# Patient Record
Sex: Male | Born: 1955
Health system: Southern US, Community
[De-identification: ages and names within clinical notes are randomized; demographics above are authoritative.]

## PROBLEM LIST (undated history)

## (undated) DIAGNOSIS — I5032 Chronic diastolic (congestive) heart failure: Secondary | ICD-10-CM

## (undated) DIAGNOSIS — R609 Edema, unspecified: Secondary | ICD-10-CM

## (undated) DIAGNOSIS — I4891 Unspecified atrial fibrillation: Secondary | ICD-10-CM

## (undated) DIAGNOSIS — I679 Cerebrovascular disease, unspecified: Secondary | ICD-10-CM

## (undated) DIAGNOSIS — R7303 Prediabetes: Secondary | ICD-10-CM

## (undated) DIAGNOSIS — G629 Polyneuropathy, unspecified: Secondary | ICD-10-CM

## (undated) DIAGNOSIS — I639 Cerebral infarction, unspecified: Secondary | ICD-10-CM

## (undated) DIAGNOSIS — G510 Bell's palsy: Secondary | ICD-10-CM

## (undated) DIAGNOSIS — I10 Essential (primary) hypertension: Secondary | ICD-10-CM

## (undated) DIAGNOSIS — G4733 Obstructive sleep apnea (adult) (pediatric): Secondary | ICD-10-CM

## (undated) DIAGNOSIS — I6523 Occlusion and stenosis of bilateral carotid arteries: Secondary | ICD-10-CM

## (undated) DIAGNOSIS — M1990 Unspecified osteoarthritis, unspecified site: Secondary | ICD-10-CM

## (undated) DIAGNOSIS — E785 Hyperlipidemia, unspecified: Secondary | ICD-10-CM

## (undated) DIAGNOSIS — K449 Diaphragmatic hernia without obstruction or gangrene: Secondary | ICD-10-CM

## (undated) DIAGNOSIS — G459 Transient cerebral ischemic attack, unspecified: Secondary | ICD-10-CM

## (undated) HISTORY — DX: Prediabetes: R73.03

## (undated) HISTORY — DX: Polyneuropathy, unspecified: G62.9

## (undated) HISTORY — PX: WISDOM TOOTH EXTRACTION: SHX21

## (undated) HISTORY — DX: Obstructive sleep apnea (adult) (pediatric): G47.33

## (undated) HISTORY — PX: ESOPHAGUS SURGERY: SHX626

## (undated) HISTORY — DX: Cerebrovascular disease, unspecified: I67.9

## (undated) HISTORY — PX: TONSILLECTOMY: SUR1361

## (undated) HISTORY — DX: Transient cerebral ischemic attack, unspecified: G45.9

## (undated) HISTORY — DX: Occlusion and stenosis of bilateral carotid arteries: I65.23

## (undated) HISTORY — DX: Chronic diastolic (congestive) heart failure: I50.32

## (undated) HISTORY — PX: KNEE SURGERY: SHX244

## (undated) HISTORY — DX: Hyperlipidemia, unspecified: E78.5

## (undated) HISTORY — DX: Edema, unspecified: R60.9

---

## 1997-10-22 ENCOUNTER — Emergency Department (HOSPITAL_COMMUNITY): Admission: EM | Admit: 1997-10-22 | Discharge: 1997-10-22 | Payer: Self-pay | Admitting: Emergency Medicine

## 1998-03-18 ENCOUNTER — Encounter: Payer: Self-pay | Admitting: Internal Medicine

## 1998-03-18 ENCOUNTER — Emergency Department (HOSPITAL_COMMUNITY): Admission: EM | Admit: 1998-03-18 | Discharge: 1998-03-18 | Payer: Self-pay | Admitting: Internal Medicine

## 1998-06-12 ENCOUNTER — Encounter: Payer: Self-pay | Admitting: Surgery

## 1998-06-12 ENCOUNTER — Ambulatory Visit (HOSPITAL_COMMUNITY): Admission: RE | Admit: 1998-06-12 | Discharge: 1998-06-12 | Payer: Self-pay | Admitting: Surgery

## 1998-06-25 ENCOUNTER — Encounter: Payer: Self-pay | Admitting: Surgery

## 1998-06-25 ENCOUNTER — Observation Stay (HOSPITAL_COMMUNITY): Admission: RE | Admit: 1998-06-25 | Discharge: 1998-06-27 | Payer: Self-pay | Admitting: Surgery

## 1999-05-29 ENCOUNTER — Emergency Department (HOSPITAL_COMMUNITY): Admission: EM | Admit: 1999-05-29 | Discharge: 1999-05-29 | Payer: Self-pay | Admitting: *Deleted

## 1999-07-08 ENCOUNTER — Ambulatory Visit (HOSPITAL_COMMUNITY): Admission: RE | Admit: 1999-07-08 | Discharge: 1999-07-08 | Payer: Self-pay | Admitting: Occupational Medicine

## 1999-07-08 ENCOUNTER — Encounter: Payer: Self-pay | Admitting: Occupational Medicine

## 2000-09-07 ENCOUNTER — Emergency Department (HOSPITAL_COMMUNITY): Admission: EM | Admit: 2000-09-07 | Discharge: 2000-09-07 | Payer: Self-pay | Admitting: Emergency Medicine

## 2000-09-07 ENCOUNTER — Encounter: Payer: Self-pay | Admitting: Emergency Medicine

## 2000-10-20 ENCOUNTER — Emergency Department (HOSPITAL_COMMUNITY): Admission: EM | Admit: 2000-10-20 | Discharge: 2000-10-20 | Payer: Self-pay | Admitting: Emergency Medicine

## 2000-10-28 ENCOUNTER — Emergency Department (HOSPITAL_COMMUNITY): Admission: EM | Admit: 2000-10-28 | Discharge: 2000-10-28 | Payer: Self-pay | Admitting: Emergency Medicine

## 2000-10-30 ENCOUNTER — Emergency Department (HOSPITAL_COMMUNITY): Admission: EM | Admit: 2000-10-30 | Discharge: 2000-10-30 | Payer: Self-pay | Admitting: Emergency Medicine

## 2003-03-06 ENCOUNTER — Emergency Department (HOSPITAL_COMMUNITY): Admission: EM | Admit: 2003-03-06 | Discharge: 2003-03-06 | Payer: Self-pay | Admitting: *Deleted

## 2003-06-12 ENCOUNTER — Emergency Department (HOSPITAL_COMMUNITY): Admission: EM | Admit: 2003-06-12 | Discharge: 2003-06-12 | Payer: Self-pay | Admitting: Emergency Medicine

## 2003-09-09 ENCOUNTER — Emergency Department (HOSPITAL_COMMUNITY): Admission: EM | Admit: 2003-09-09 | Discharge: 2003-09-09 | Payer: Self-pay | Admitting: Emergency Medicine

## 2005-10-19 ENCOUNTER — Emergency Department (HOSPITAL_COMMUNITY): Admission: EM | Admit: 2005-10-19 | Discharge: 2005-10-19 | Payer: Self-pay | Admitting: Emergency Medicine

## 2006-09-19 ENCOUNTER — Emergency Department (HOSPITAL_COMMUNITY): Admission: EM | Admit: 2006-09-19 | Discharge: 2006-09-19 | Payer: Self-pay | Admitting: Emergency Medicine

## 2007-12-02 ENCOUNTER — Emergency Department (HOSPITAL_BASED_OUTPATIENT_CLINIC_OR_DEPARTMENT_OTHER): Admission: EM | Admit: 2007-12-02 | Discharge: 2007-12-02 | Payer: Self-pay | Admitting: Emergency Medicine

## 2008-05-25 ENCOUNTER — Emergency Department (HOSPITAL_BASED_OUTPATIENT_CLINIC_OR_DEPARTMENT_OTHER): Admission: EM | Admit: 2008-05-25 | Discharge: 2008-05-25 | Payer: Self-pay | Admitting: Emergency Medicine

## 2008-05-25 ENCOUNTER — Ambulatory Visit: Payer: Self-pay | Admitting: Diagnostic Radiology

## 2008-07-23 ENCOUNTER — Emergency Department (HOSPITAL_BASED_OUTPATIENT_CLINIC_OR_DEPARTMENT_OTHER): Admission: EM | Admit: 2008-07-23 | Discharge: 2008-07-23 | Payer: Self-pay | Admitting: Emergency Medicine

## 2008-08-21 ENCOUNTER — Ambulatory Visit: Payer: Self-pay | Admitting: Diagnostic Radiology

## 2008-08-21 ENCOUNTER — Emergency Department (HOSPITAL_BASED_OUTPATIENT_CLINIC_OR_DEPARTMENT_OTHER): Admission: EM | Admit: 2008-08-21 | Discharge: 2008-08-21 | Payer: Self-pay | Admitting: Emergency Medicine

## 2009-08-20 ENCOUNTER — Emergency Department (HOSPITAL_BASED_OUTPATIENT_CLINIC_OR_DEPARTMENT_OTHER): Admission: EM | Admit: 2009-08-20 | Discharge: 2009-08-20 | Payer: Self-pay | Admitting: Emergency Medicine

## 2010-01-17 ENCOUNTER — Emergency Department (HOSPITAL_BASED_OUTPATIENT_CLINIC_OR_DEPARTMENT_OTHER): Admission: EM | Admit: 2010-01-17 | Discharge: 2010-01-18 | Payer: Self-pay | Admitting: Emergency Medicine

## 2010-03-29 ENCOUNTER — Emergency Department (HOSPITAL_BASED_OUTPATIENT_CLINIC_OR_DEPARTMENT_OTHER)
Admission: EM | Admit: 2010-03-29 | Discharge: 2010-03-30 | Payer: Self-pay | Source: Home / Self Care | Admitting: Emergency Medicine

## 2010-05-18 ENCOUNTER — Emergency Department (HOSPITAL_BASED_OUTPATIENT_CLINIC_OR_DEPARTMENT_OTHER)
Admission: EM | Admit: 2010-05-18 | Discharge: 2010-05-18 | Disposition: A | Discharge status: Home / Self Care | Payer: Self-pay | Attending: Emergency Medicine | Admitting: Emergency Medicine

## 2010-05-18 ENCOUNTER — Emergency Department (INDEPENDENT_AMBULATORY_CARE_PROVIDER_SITE_OTHER): Payer: Self-pay

## 2010-05-18 DIAGNOSIS — K219 Gastro-esophageal reflux disease without esophagitis: Secondary | ICD-10-CM | POA: Insufficient documentation

## 2010-05-18 DIAGNOSIS — R413 Other amnesia: Secondary | ICD-10-CM

## 2010-05-18 DIAGNOSIS — R220 Localized swelling, mass and lump, head: Secondary | ICD-10-CM

## 2010-05-18 DIAGNOSIS — R221 Localized swelling, mass and lump, neck: Secondary | ICD-10-CM | POA: Insufficient documentation

## 2010-05-18 DIAGNOSIS — I10 Essential (primary) hypertension: Secondary | ICD-10-CM | POA: Insufficient documentation

## 2010-05-18 LAB — URINALYSIS, ROUTINE W REFLEX MICROSCOPIC
Bilirubin Urine: NEGATIVE
Hgb urine dipstick: NEGATIVE
Nitrite: NEGATIVE
Protein, ur: NEGATIVE mg/dL
Specific Gravity, Urine: 1.023 (ref 1.005–1.030)
Urobilinogen, UA: 1 mg/dL (ref 0.0–1.0)

## 2010-05-18 LAB — DIFFERENTIAL
Basophils Absolute: 0.2 10*3/uL — ABNORMAL HIGH (ref 0.0–0.1)
Basophils Relative: 2 % — ABNORMAL HIGH (ref 0–1)
Eosinophils Absolute: 0.4 10*3/uL (ref 0.0–0.7)
Monocytes Absolute: 0.5 10*3/uL (ref 0.1–1.0)
Monocytes Relative: 7 % (ref 3–12)
Neutro Abs: 3.4 10*3/uL (ref 1.7–7.7)

## 2010-05-18 LAB — BASIC METABOLIC PANEL
Calcium: 9 mg/dL (ref 8.4–10.5)
Creatinine, Ser: 1.1 mg/dL (ref 0.4–1.5)
GFR calc Af Amer: >60 mL/min (ref >60)
GFR calc non Af Amer: >60 mL/min (ref >60)
Sodium: 142 mEq/L (ref 135–145)

## 2010-05-18 LAB — CBC
Hemoglobin: 15.4 g/dL (ref 13.0–17.0)
MCH: 29.6 pg (ref 26.0–34.0)
MCHC: 35.6 g/dL (ref 30.0–36.0)
Platelets: 350 10*3/uL (ref 150–400)
RDW: 12.7 % (ref 11.5–15.5)

## 2010-05-27 LAB — CBC
MCH: 30.7 pg (ref 26.0–34.0)
Platelets: 291 10*3/uL (ref 150–400)
RBC: 4.77 MIL/uL (ref 4.22–5.81)

## 2010-05-27 LAB — DIFFERENTIAL
Basophils Relative: 1 % (ref 0–1)
Eosinophils Absolute: 0.3 10*3/uL (ref 0.0–0.7)
Lymphs Abs: 2.9 10*3/uL (ref 0.7–4.0)
Neutro Abs: 3.1 10*3/uL (ref 1.7–7.7)
Neutrophils Relative %: 47 % (ref 43–77)

## 2010-05-27 LAB — URINALYSIS, ROUTINE W REFLEX MICROSCOPIC
Hgb urine dipstick: NEGATIVE
Nitrite: NEGATIVE
Protein, ur: NEGATIVE mg/dL
Specific Gravity, Urine: 1.029 (ref 1.005–1.030)
Urobilinogen, UA: 1 mg/dL (ref 0.0–1.0)

## 2010-05-27 LAB — BASIC METABOLIC PANEL
CO2: 26 mEq/L (ref 19–32)
Calcium: 9.3 mg/dL (ref 8.4–10.5)
Creatinine, Ser: 1.2 mg/dL (ref 0.4–1.5)
GFR calc Af Amer: >60 mL/min (ref >60)
GFR calc non Af Amer: >60 mL/min (ref >60)
Sodium: 144 mEq/L (ref 135–145)

## 2011-05-14 DIAGNOSIS — M25571 Pain in right ankle and joints of right foot: Secondary | ICD-10-CM | POA: Insufficient documentation

## 2011-05-14 DIAGNOSIS — M958 Other specified acquired deformities of musculoskeletal system: Secondary | ICD-10-CM | POA: Insufficient documentation

## 2012-06-29 ENCOUNTER — Emergency Department (HOSPITAL_COMMUNITY)
Admission: EM | Admit: 2012-06-29 | Discharge: 2012-06-29 | Disposition: A | Discharge status: Home / Self Care | Payer: Self-pay | Attending: Emergency Medicine | Admitting: Emergency Medicine

## 2012-06-29 ENCOUNTER — Emergency Department (HOSPITAL_COMMUNITY): Payer: Self-pay

## 2012-06-29 ENCOUNTER — Encounter (HOSPITAL_COMMUNITY): Payer: Self-pay | Admitting: Emergency Medicine

## 2012-06-29 DIAGNOSIS — R51 Headache: Secondary | ICD-10-CM | POA: Insufficient documentation

## 2012-06-29 DIAGNOSIS — R509 Fever, unspecified: Secondary | ICD-10-CM | POA: Insufficient documentation

## 2012-06-29 DIAGNOSIS — W57XXXA Bitten or stung by nonvenomous insect and other nonvenomous arthropods, initial encounter: Secondary | ICD-10-CM | POA: Insufficient documentation

## 2012-06-29 DIAGNOSIS — R5381 Other malaise: Secondary | ICD-10-CM | POA: Insufficient documentation

## 2012-06-29 DIAGNOSIS — M7989 Other specified soft tissue disorders: Secondary | ICD-10-CM | POA: Insufficient documentation

## 2012-06-29 DIAGNOSIS — R209 Unspecified disturbances of skin sensation: Secondary | ICD-10-CM | POA: Insufficient documentation

## 2012-06-29 DIAGNOSIS — Y929 Unspecified place or not applicable: Secondary | ICD-10-CM | POA: Insufficient documentation

## 2012-06-29 DIAGNOSIS — R5383 Other fatigue: Secondary | ICD-10-CM | POA: Insufficient documentation

## 2012-06-29 DIAGNOSIS — S30860A Insect bite (nonvenomous) of lower back and pelvis, initial encounter: Secondary | ICD-10-CM | POA: Insufficient documentation

## 2012-06-29 DIAGNOSIS — R609 Edema, unspecified: Secondary | ICD-10-CM | POA: Insufficient documentation

## 2012-06-29 DIAGNOSIS — M549 Dorsalgia, unspecified: Secondary | ICD-10-CM | POA: Insufficient documentation

## 2012-06-29 DIAGNOSIS — Z8673 Personal history of transient ischemic attack (TIA), and cerebral infarction without residual deficits: Secondary | ICD-10-CM | POA: Insufficient documentation

## 2012-06-29 DIAGNOSIS — H5789 Other specified disorders of eye and adnexa: Secondary | ICD-10-CM | POA: Insufficient documentation

## 2012-06-29 DIAGNOSIS — Y9389 Activity, other specified: Secondary | ICD-10-CM | POA: Insufficient documentation

## 2012-06-29 DIAGNOSIS — Z7982 Long term (current) use of aspirin: Secondary | ICD-10-CM | POA: Insufficient documentation

## 2012-06-29 DIAGNOSIS — R0789 Other chest pain: Secondary | ICD-10-CM | POA: Insufficient documentation

## 2012-06-29 DIAGNOSIS — R0602 Shortness of breath: Secondary | ICD-10-CM | POA: Insufficient documentation

## 2012-06-29 LAB — BASIC METABOLIC PANEL
BUN: 13 mg/dL (ref 6–23)
Calcium: 8.8 mg/dL (ref 8.4–10.5)
Chloride: 100 mEq/L (ref 96–112)
Creatinine, Ser: 1.08 mg/dL (ref 0.50–1.35)
GFR calc Af Amer: 86 mL/min — ABNORMAL LOW (ref >90)
GFR calc non Af Amer: 74 mL/min — ABNORMAL LOW (ref >90)
Glucose, Bld: 109 mg/dL — ABNORMAL HIGH (ref 70–99)
Sodium: 135 mEq/L (ref 135–145)

## 2012-06-29 LAB — CBC
Hemoglobin: 14.5 g/dL (ref 13.0–17.0)
Platelets: 333 10*3/uL (ref 150–400)
RBC: 5.09 MIL/uL (ref 4.22–5.81)
WBC: 5.3 10*3/uL (ref 4.0–10.5)

## 2012-06-29 MED ORDER — KETOROLAC TROMETHAMINE 30 MG/ML IJ SOLN
30.0000 mg | Freq: Once | INTRAMUSCULAR | Status: AC
  Administered 2012-06-29: 30 mg via INTRAVENOUS
  Filled 2012-06-29: qty 1

## 2012-06-29 MED ORDER — ONDANSETRON HCL 4 MG/2ML IJ SOLN
4.0000 mg | Freq: Once | INTRAMUSCULAR | Status: AC
  Administered 2012-06-29: 4 mg via INTRAVENOUS
  Filled 2012-06-29: qty 2

## 2012-06-29 MED ORDER — DOXYCYCLINE HYCLATE 100 MG PO TABS
100.0000 mg | ORAL_TABLET | Freq: Once | ORAL | Status: AC
  Administered 2012-06-29: 100 mg via ORAL
  Filled 2012-06-29: qty 1

## 2012-06-29 MED ORDER — IBUPROFEN 800 MG PO TABS: 800.0000 mg | ORAL_TABLET | Freq: Three times a day (TID) | ORAL | Status: DC | PRN

## 2012-06-29 MED ORDER — CYCLOBENZAPRINE HCL 5 MG PO TABS: 5.0000 mg | ORAL_TABLET | Freq: Three times a day (TID) | ORAL | Status: DC | PRN

## 2012-06-29 MED ORDER — DOXYCYCLINE HYCLATE 100 MG PO TABS: 100.0000 mg | ORAL_TABLET | Freq: Two times a day (BID) | ORAL | Status: DC

## 2012-06-29 NOTE — ED Provider Notes (Signed)
I saw and evaluated the patient, reviewed the resident's note and I agree with the findings and plan.   .Face to face Exam:  General:  Awake HEENT:  Atraumatic Resp:  Normal effort Abd:  Nondistended Neuro:No focal weakness    Nelia Shi, MD 06/29/12 2245

## 2012-06-29 NOTE — ED Provider Notes (Signed)
History     CSN: [id number]  Arrival date & time 06/29/12  1502   First MD Initiated Contact with Patient 06/29/12 1511      Chief Complaint  Patient presents with  . Chest Pain  . Back Pain    (Consider location/radiation/quality/duration/timing/severity/associated sxs/prior treatment) HPI 57 yo M with h/o of "mini-stroke" who presents with multiple concerns.    1. Chest pain: located in upper left chest, described as sharp, lasts <1 minute and resolves on it's own.  Occurs intermittently, not every day.  Not associated with activity.  Does get a little short of breath with it, but no diaphoresis or dizziness. 2. Headaches: Been going on for about 5 months.  Located through out head, described as "real bad."  Typically last 30-60 minutes.  No associated nausea or photophobia, but does states it gets better in a dark, quiet room.  His wife reports that the headaches seem to be worse at night, but he denies any triggers for the headaches.  No associated neurologic deficits.  Does get a blood shot right eye that may be associated with the headaches.  Does not take any medication for them, they resolve on their own. 3. Back pain: Started last night, located in mid and low back, no radiation, gradually increase in pain/discomfort.  More comfortable when lying on his side.  Denies any recent trauma or heavy lifting.  If anything, activity has been decreased the last few days. 4. Neck swelling: Feels like his neck is swollen under his chin.  Also states he feels like he has been having fevers.  No night sweats or weight loss. 5. Shortness of breath: Chronic.  May be a little worse with lying flat.  No cough.  History reviewed. No pertinent past medical history.  History reviewed. No pertinent past surgical history.  No family history on file.  History  Substance Use Topics  . Smoking status: Never Smoker   . Smokeless tobacco: Never Used  . Alcohol Use: No      Review of Systems   Constitutional: Positive for fever and activity change (decreased). Negative for chills.  HENT: Negative for congestion, sore throat, rhinorrhea, neck pain and neck stiffness.        Neck swelling  Eyes: Positive for redness (intermittent blood shot right eye).  Respiratory: Positive for shortness of breath. Negative for cough.   Cardiovascular: Positive for chest pain and leg swelling. Negative for palpitations.  Gastrointestinal: Negative for nausea, vomiting, diarrhea, constipation and abdominal distention.  Genitourinary: Negative.   Musculoskeletal: Positive for back pain.  Skin: Negative.   Neurological: Positive for weakness (generalized), numbness (intermittent in left foot) and headaches. Negative for syncope and light-headedness.    Allergies  Penicillins  Home Medications   Current Outpatient Rx  Name  Route  Sig  Dispense  Refill  . aspirin 325 MG tablet   Oral   Take 325 mg by mouth once.           BP 136/91  Pulse 70  Temp(Src) 98.7 F (37.1 C) (Oral)  Resp 21  SpO2 98%  Physical Exam  Constitutional: He is oriented to person, place, and time. He appears well-developed and well-nourished. No distress.  Morbidly obese  HENT:  Head: Normocephalic and atraumatic.  Right Ear: External ear normal.  Left Ear: External ear normal.  Mouth/Throat: Oropharynx is clear and moist. No oropharyngeal exudate.  Eyes: Conjunctivae and EOM are normal. Pupils are equal, round, and reactive to light. Right  eye exhibits no discharge. Left eye exhibits no discharge. No scleral icterus.  Neck: Normal range of motion. Neck supple. No JVD present. No tracheal deviation present.  Bilateral sub-mandibular LAD  Cardiovascular: Normal rate, regular rhythm, normal heart sounds and intact distal pulses.   No murmur heard. Pulmonary/Chest: Effort normal and breath sounds normal. No respiratory distress. He has no wheezes. He has no rales. He exhibits tenderness (left anterior superior  chest wall).  Abdominal: Soft. Bowel sounds are normal. He exhibits no distension and no mass. There is no tenderness. There is no rebound and no guarding.  Limited exam due to body habitus  Musculoskeletal: He exhibits edema (2+ pitting edema, slightly worse on the left). He exhibits no tenderness.  Back: No erythema, edema, or obvious deformity.  Tender over lumbar spine and paraspinous muscles.  Some spasm in lumbar paraspinous muscles.  No point tenderness.  Neurological: He is alert and oriented to person, place, and time. He displays normal reflexes. No cranial nerve deficit. He exhibits normal muscle tone. Coordination normal.  Skin: Skin is warm and dry. No rash noted. He is not diaphoretic. No erythema.  Psychiatric: He has a normal mood and affect. Thought content normal.    ED Course  Procedures (including critical care time)  Labs Reviewed  CBC  BASIC METABOLIC PANEL  PRO B NATRIURETIC PEPTIDE  POCT I-STAT TROPONIN I   Dg Chest Port 1 View  06/29/2012  *RADIOLOGY REPORT*  Clinical Data: Chest pain.  Back pain.  Short of breath.  PORTABLE CHEST - 1 VIEW  Comparison: 02/28/2012.  Findings:  Cardiopericardial silhouette within normal limits. Mediastinal contours normal. Trachea midline.  No airspace disease or effusion. Monitoring leads are projected over the chest. No pneumothorax.  IMPRESSION: No active cardiopulmonary disease or interval change.   Original Report Authenticated By: Andreas Newport, M.D.      No diagnosis found.    MDM  57 yo M with ? History of TIA presenting with multiple concerns.  Chest pain is atypical on history and reproducible on exam, EKG negative for ischemia, troponin negative.  Suspect shortness of breath is related to obesity, CXR negative for pneumonia or pulmonary edema.  PE is in the differential but Wells score is 0. He does have LE swelling but this is more likely related to obesity as he reports it is bilateral and improves with elevation.   Headaches are likely benign given lack of neurologic findings and normal head CT 2 years ago.  Did report tick bite 1-2 weeks ago and subjective fevers - will treat presumptively for RMSF.  Labs are unremarkable.  Discussed importance of weight loss for his overall health.  Also encouraged him to find a PCP that can follow his concerns over time.  Provided number for IMTS clinic.  Will d/c with flexeril, ibuprofen and 10 day course of doxycycline.  Reviewed reasons to return.  Patient agreeable with plan.         Phebe Colla, MD 06/29/12 [phone]

## 2012-06-29 NOTE — Progress Notes (Signed)
During [facility] ED 06/29/12 visit CM spoke with pt who confirms self pay [place] resident with no pcp. CM discussed and provided written information for self pay pcps, importance of pcp for f/u care, www.needymeds.org, discounted pharmacies and other [place] and [place] resources such as financial assistance, DSS and  health department Reviewed Health connect number to assist with finding self pay provider close to pt's residence. Reviewed resources for  and [place] self pay pcps like [facility], family medicine at [address], [facility] family practice, general medical clinics, [facility] urgent care plus others, [facility] out patient pharmacies and housing Pt voiced understanding and appreciation of resources provided

## 2012-06-29 NOTE — ED Notes (Signed)
Pt c/o left sided chest pain that started late last night and back pain.  Pt states that he feels like his neck is swollen.  Pt states that he has been Shob, nauseated and lightheaded with chest pain.  Pt has been having headaches for little while now, and pt's family believes it might be BP related.  Pt doesn't have a PCP.  Pt was in [place] hospital think 6 months ago with "mini strokes".

## 2012-08-04 ENCOUNTER — Encounter (HOSPITAL_COMMUNITY): Payer: Self-pay | Admitting: *Deleted

## 2012-08-04 ENCOUNTER — Emergency Department (HOSPITAL_COMMUNITY)
Admission: EM | Admit: 2012-08-04 | Discharge: 2012-08-04 | Disposition: A | Discharge status: Home / Self Care | Payer: Self-pay | Attending: Emergency Medicine | Admitting: Emergency Medicine

## 2012-08-04 ENCOUNTER — Emergency Department (HOSPITAL_COMMUNITY): Payer: Self-pay

## 2012-08-04 DIAGNOSIS — M25469 Effusion, unspecified knee: Secondary | ICD-10-CM | POA: Insufficient documentation

## 2012-08-04 DIAGNOSIS — M25461 Effusion, right knee: Secondary | ICD-10-CM

## 2012-08-04 MED ORDER — HYDROCODONE-ACETAMINOPHEN 5-325 MG PO TABS: 2.0000 | ORAL_TABLET | Freq: Four times a day (QID) | ORAL | Status: DC | PRN

## 2012-08-04 NOTE — ED Provider Notes (Signed)
History  This chart was scribed for non-physician practitioner Roxy Horseman, PA-C working with Ward Givens, MD, by Candelaria Stagers, ED Scribe. This patient was seen in room TR08C/TR08C and the patient's care was started at 8:59 PM   CSN: [id number]  Arrival date & time 08/04/12  1949   First MD Initiated Contact with Patient 08/04/12 2003      Chief Complaint  Patient presents with  . Knee Pain     The history is provided by the patient. No language interpreter was used.   HPI Comments: Edward Weaver is a 57 y.o. male who presents to the Emergency Department complaining of constant right knee pain that became worse several days ago.  Pt reports that today the knee gave out while walking.  He has h/o surgery to the right knee with screw in place.  Pt denies any recent injury or trauma.  Pt is ambulatory with pain.  He has taken nothing for the pain.    No past medical history on file.  Past Surgical History  Procedure Laterality Date  . Knee surgery Right     1974    No family history on file.  History  Substance Use Topics  . Smoking status: Never Smoker   . Smokeless tobacco: Never Used  . Alcohol Use: No      Review of Systems  Musculoskeletal: Positive for arthralgias (right knee pain).  All other systems reviewed and are negative.    Allergies  Penicillins  Home Medications   Current Outpatient Rx  Name  Route  Sig  Dispense  Refill  . ibuprofen (ADVIL,MOTRIN) 800 MG tablet   Oral   Take 1 tablet (800 mg total) by mouth every 8 (eight) hours as needed for pain.   30 tablet   0     BP 138/84  Pulse 68  Temp(Src) 97.9 F (36.6 C)  Resp 18  SpO2 97%  Physical Exam  Nursing note and vitals reviewed. Constitutional: He is oriented to person, place, and time. He appears well-developed and well-nourished. No distress.  HENT:  Head: Normocephalic and atraumatic.  Eyes: EOM are normal.  Neck: Neck supple. No tracheal deviation present.   Cardiovascular: Normal rate.   Pulmonary/Chest: Effort normal. No respiratory distress.  Musculoskeletal: Normal range of motion. He exhibits edema and tenderness.  Right knee moderately swollen. No bony deformity or abnormality.  Mild joint line tenderness.  ROM 4/5.  Strength deferred.  Joint stability testing deferred secondary to swelling.    Neurological: He is alert and oriented to person, place, and time.  Skin: Skin is warm and dry.  Psychiatric: He has a normal mood and affect. His behavior is normal.    ED Course  Procedures   DIAGNOSTIC STUDIES: Oxygen Saturation is 97% on room air, normal by my interpretation.    COORDINATION OF CARE:  9:01 PM Discussed course of care with pt which includes follow up with orthopaedist.  Will order knee sleeve and pain medication. Pt denies crutches Discussed images with pt.  Pt understands and agrees.    Labs Reviewed - No data to display Dg Knee Complete 4 Views Right  08/04/2012   *RADIOLOGY REPORT*  Clinical Data: Right knee pain and swelling.  RIGHT KNEE - COMPLETE 4+ VIEW  Comparison: Right tibia and fibula on 07/29/2012  Findings: No evidence of acute fracture or dislocation. A small knee joint effusion noted.  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  A fixation  screw is seen in the anterior tibial tubercle.  There is no evidence of hardware failure or loosening.  IMPRESSION:  1.  Mild osteoarthritis. 2.  Small knee joint effusion.   Original Report Authenticated By: Myles Rosenthal, M.D.     1. Knee effusion, right       MDM  Patient with right knee effusion. Suspect meniscal or ligament damage. Was unable to get accurate joint stability tests due to guarding. Recommend outpatient orthopedic followup. The patient a knee sleeve. Patient is stable and ready for discharge.  I personally performed the services described in this documentation, which was scribed in my presence. The recorded  information has been reviewed and is accurate.          Roxy Horseman, PA-C 08/04/12 2328  Roxy Horseman, PA-C 08/09/12 [phone]

## 2012-08-04 NOTE — ED Notes (Addendum)
PT reports that he had (R) knee pain that started today.  Denies injury.  States that he had surgery on it in 1974.  Pt ambulatory

## 2012-08-10 NOTE — ED Provider Notes (Signed)
Medical screening examination/treatment/procedure(s) were performed by non-physician practitioner and as supervising physician I was immediately available for consultation/collaboration. Brailynn Breth, MD, FACEP   Tanisa Lagace L Kase Shughart, MD 08/10/12 0001 

## 2012-09-23 ENCOUNTER — Encounter (HOSPITAL_BASED_OUTPATIENT_CLINIC_OR_DEPARTMENT_OTHER): Payer: Self-pay

## 2012-09-23 ENCOUNTER — Emergency Department (HOSPITAL_BASED_OUTPATIENT_CLINIC_OR_DEPARTMENT_OTHER)
Admission: EM | Admit: 2012-09-23 | Discharge: 2012-09-23 | Disposition: A | Discharge status: Home / Self Care | Payer: Self-pay | Attending: Emergency Medicine | Admitting: Emergency Medicine

## 2012-09-23 DIAGNOSIS — R21 Rash and other nonspecific skin eruption: Secondary | ICD-10-CM | POA: Insufficient documentation

## 2012-09-23 DIAGNOSIS — Z880 Allergy status to penicillin: Secondary | ICD-10-CM | POA: Insufficient documentation

## 2012-09-23 DIAGNOSIS — R209 Unspecified disturbances of skin sensation: Secondary | ICD-10-CM | POA: Insufficient documentation

## 2012-09-23 MED ORDER — CEPHALEXIN 500 MG PO CAPS: 500.0000 mg | ORAL_CAPSULE | Freq: Four times a day (QID) | ORAL | Status: DC

## 2012-09-23 MED ORDER — MUPIROCIN CALCIUM 2 % EX CREA: TOPICAL_CREAM | Freq: Three times a day (TID) | CUTANEOUS | Status: DC

## 2012-09-23 NOTE — ED Notes (Signed)
Rash in bilateral axilla x 1 week.

## 2012-09-23 NOTE — ED Provider Notes (Signed)
   History    CSN: [id number] Arrival date & time 09/23/12  1155  First MD Initiated Contact with Patient 09/23/12 1241     Chief Complaint  Patient presents with  . Rash   (Consider location/radiation/quality/duration/timing/severity/associated sxs/prior Treatment) HPI Comments: Patient is a 57 year old male who presents today with one week of worsening rash in his armpits bilaterally. The rash initially began solely in his underarm as little red dots and has spread down his flank. The red bumps are tender to palpation and he gives the quality of "sore". The bumps have whiteheads. He has tried antifungal cream with no relief. He recently started using [place] soap. He denies fever, chills, nausea, vomiting, abdominal pain, shortness of breath, change in detergent. He reports he is nondiabetic.   The history is provided by the patient. No language interpreter was used.   History reviewed. No pertinent past medical history. Past Surgical History  Procedure Laterality Date  . Knee surgery Right     1974   No family history on file. History  Substance Use Topics  . Smoking status: Never Smoker   . Smokeless tobacco: Never Used  . Alcohol Use: No    Review of Systems  Constitutional: Negative for fever and chills.  Respiratory: Negative for shortness of breath.   Cardiovascular: Negative for chest pain.  Gastrointestinal: Negative for nausea, vomiting and abdominal pain.  Skin: Positive for rash.  All other systems reviewed and are negative.    Allergies  Penicillins  Home Medications  No current outpatient prescriptions on file. BP 130/88  Pulse 69  Temp(Src) 97.9 F (36.6 C) (Oral)  Resp 16  Ht 5\' 6"  (1.676 m)  Wt 270 lb (122.471 kg)  BMI 43.6 kg/m2  SpO2 97% Physical Exam  Nursing note and vitals reviewed. Constitutional: He is oriented to person, place, and time. He appears well-developed and well-nourished. No distress.  HENT:  Head: Normocephalic and  atraumatic.  Right Ear: External ear normal.  Left Ear: External ear normal.  Nose: Nose normal.  Eyes: Conjunctivae are normal.  Neck: Normal range of motion. No tracheal deviation present.  Cardiovascular: Normal rate, regular rhythm and normal heart sounds.   Pulmonary/Chest: Effort normal and breath sounds normal. No stridor.  Abdominal: Soft. He exhibits no distension. There is no tenderness.  Musculoskeletal: Normal range of motion.  Neurological: He is alert and oriented to person, place, and time.  Skin: Skin is warm and dry. He is not diaphoretic.  Multiple small areas of erythema with central purulent head beginning and underarms bilaterally and spreading down to his leg  Psychiatric: He has a normal mood and affect. His behavior is normal.    ED Course  Procedures (including critical care time) Labs Reviewed  GLUCOSE, CAPILLARY - Abnormal; Notable for the following:    Glucose-Capillary 106 (*)    All other components within normal limits   No results found. 1. Rash     MDM  Patient presents with bacterial rash in armpits. It is spreading. Given both PO and topical abx. CBG done as I was suspicious for DM. Follow up with your PCP. Discussed case with Dr. Bernette Mayers who agrees with plan. Return instructions given. Vital signs stable for discharge. Patient / Family / Caregiver informed of clinical course, understand medical decision-making process, and agree with plan.   Mora Bellman, PA-C 09/23/12 1347

## 2012-09-25 NOTE — ED Provider Notes (Signed)
Medical screening examination/treatment/procedure(s) were performed by non-physician practitioner and as supervising physician I was immediately available for consultation/collaboration.   Scarlet Abad B. Marionette Meskill, MD 09/25/12 1946 

## 2013-04-30 ENCOUNTER — Emergency Department (HOSPITAL_BASED_OUTPATIENT_CLINIC_OR_DEPARTMENT_OTHER)
Admission: EM | Admit: 2013-04-30 | Discharge: 2013-04-30 | Disposition: A | Discharge status: Home / Self Care | Payer: No Typology Code available for payment source | Attending: Emergency Medicine | Admitting: Emergency Medicine

## 2013-04-30 ENCOUNTER — Encounter (HOSPITAL_BASED_OUTPATIENT_CLINIC_OR_DEPARTMENT_OTHER): Payer: Self-pay | Admitting: Emergency Medicine

## 2013-04-30 ENCOUNTER — Emergency Department (HOSPITAL_BASED_OUTPATIENT_CLINIC_OR_DEPARTMENT_OTHER): Payer: No Typology Code available for payment source

## 2013-04-30 DIAGNOSIS — Y9389 Activity, other specified: Secondary | ICD-10-CM | POA: Insufficient documentation

## 2013-04-30 DIAGNOSIS — Z8719 Personal history of other diseases of the digestive system: Secondary | ICD-10-CM | POA: Insufficient documentation

## 2013-04-30 DIAGNOSIS — S92424A Nondisplaced fracture of distal phalanx of right great toe, initial encounter for closed fracture: Secondary | ICD-10-CM

## 2013-04-30 DIAGNOSIS — E669 Obesity, unspecified: Secondary | ICD-10-CM | POA: Insufficient documentation

## 2013-04-30 DIAGNOSIS — S92919A Unspecified fracture of unspecified toe(s), initial encounter for closed fracture: Secondary | ICD-10-CM | POA: Insufficient documentation

## 2013-04-30 DIAGNOSIS — S92501A Displaced unspecified fracture of right lesser toe(s), initial encounter for closed fracture: Secondary | ICD-10-CM

## 2013-04-30 DIAGNOSIS — Z792 Long term (current) use of antibiotics: Secondary | ICD-10-CM | POA: Insufficient documentation

## 2013-04-30 DIAGNOSIS — Y929 Unspecified place or not applicable: Secondary | ICD-10-CM | POA: Insufficient documentation

## 2013-04-30 DIAGNOSIS — Z880 Allergy status to penicillin: Secondary | ICD-10-CM | POA: Insufficient documentation

## 2013-04-30 DIAGNOSIS — W208XXA Other cause of strike by thrown, projected or falling object, initial encounter: Secondary | ICD-10-CM | POA: Insufficient documentation

## 2013-04-30 HISTORY — DX: Diaphragmatic hernia without obstruction or gangrene: K44.9

## 2013-04-30 MED ORDER — SULFAMETHOXAZOLE-TMP DS 800-160 MG PO TABS: 1.0000 | ORAL_TABLET | Freq: Two times a day (BID) | ORAL | Status: DC

## 2013-04-30 NOTE — ED Provider Notes (Signed)
CSN: [id number]     Arrival date & time 04/30/13  1653 History   First MD Initiated Contact with Patient 04/30/13 2000     Chief Complaint  Patient presents with  . Toe Injury     (Consider location/radiation/quality/duration/timing/severity/associated sxs/prior Treatment) Patient is a 58 y.o. male presenting with toe pain. The history is provided by the patient.  Toe Pain This is a new problem. The current episode started 1 to 4 weeks ago. The problem occurs constantly. The problem has been unchanged.   Edward Weaver is a 58 y.o. male who presents to the ED with pain in the toes of the right foot after dropping a concrete deer on his toes 2 weeks ago. He had a break in the skin and cleaned it with peroxide. His toe nail turned black and blue. Looks like the nail may come off.  He continues to have pain.  Past Medical History  Diagnosis Date  . Hiatal hernia    Past Surgical History  Procedure Laterality Date  . Knee surgery Right     1974  . Esophagus surgery     No family history on file. History  Substance Use Topics  . Smoking status: Never Smoker   . Smokeless tobacco: Never Used  . Alcohol Use: No    Review of Systems Negative except as stated in HPI   Allergies  Penicillins  Home Medications   Current Outpatient Rx  Name  Route  Sig  Dispense  Refill  . cephALEXin (KEFLEX) 500 MG capsule   Oral   Take 1 capsule (500 mg total) by mouth 4 (four) times daily.   40 capsule   0   . mupirocin cream (BACTROBAN) 2 %   Topical   Apply topically 3 (three) times daily.   15 g   0    BP 141/89  Pulse 64  Temp(Src) 97.9 F (36.6 C) (Oral)  Resp 16  Ht 5\' 6"  (1.676 m)  Wt 260 lb (117.935 kg)  BMI 41.99 kg/m2  SpO2 98% Physical Exam  Nursing note and vitals reviewed. Constitutional: He is oriented to person, place, and time. No distress.  Obese   HENT:  Head: Atraumatic.  Eyes: Conjunctivae and EOM are normal.  Neck: Neck supple.  Cardiovascular:  Normal rate.   Pulmonary/Chest: Effort normal.  Musculoskeletal:       Right foot: He exhibits tenderness and swelling. He exhibits normal range of motion and no laceration.       Feet:  There is ecchymosis noted of the right great toe including the nail. The nail is loose, there is tenderness with palpation of the nail and toe. There is less tenderness of the other toes and no bruising noted. Pedal pulse is strong, adequate circulation and good touch sensation.   Neurological: He is alert and oriented to person, place, and time. No cranial nerve deficit.  Skin: Skin is warm and dry.  Psychiatric: He has a normal mood and affect. His behavior is normal.    ED Course  Procedures (including critical care time) Labs Review Labs Reviewed - No data to display Imaging Review Dg Foot Complete Right  04/30/2013   CLINICAL DATA:  Right foot injury 2 weeks ago.  Continued pain.  EXAM: RIGHT FOOT COMPLETE - 3+ VIEW  COMPARISON:  None.  FINDINGS: There is a fracture of the tuft of the great toe. There also appears to be a fracture of the tuft of the right fourth toe. No  other acute bony or joint abnormality is identified.  IMPRESSION: Tuft fractures right great and fourth toes.   Electronically Signed   By: Drusilla Kanner M.D.   On: 04/30/2013 17:35    MDM  58 y.o. male with pain in the toes of his right foot s/p injury 2 weeks ago. Will treat with buddy tape and post op shoe. He will follow up with ortho. Will treat with antibiotics for possible early infection around the nail of the great toe. Patient remains neurovascularly intact and stable for discharge.    Medication List    STOP taking these medications       cephALEXin 500 MG capsule  Commonly known as:  KEFLEX      TAKE these medications       sulfamethoxazole-trimethoprim 800-160 MG per tablet  Commonly known as:  BACTRIM DS  Take 1 tablet by mouth 2 (two) times daily.      ASK your doctor about these medications        mupirocin cream 2 %  Commonly known as:  BACTROBAN  Apply topically 3 (three) times daily.        Janne Napoleon, NP 05/01/13 [phone]

## 2013-04-30 NOTE — Discharge Instructions (Signed)
Follow up with Dr. Pearletha ForgeHudnall. Return here as needed  Buddy Taping of Toes We have taped your toes together to keep them from moving. This is called "buddy taping" since we used a part of your own body to keep the injured part still. We placed soft padding between your toes to keep them from rubbing against each other. Buddy taping will help with healing and to reduce pain. Keep your toes buddy taped together for as long as directed by your caregiver. HOME CARE INSTRUCTIONS   Raise your injured area above the level of your heart while sitting or lying down. Prop it up with pillows.  An ice pack used every twenty minutes, while awake, for the first one to two days may be helpful. Put ice in a plastic bag and put a towel between the bag and your skin.  Watch for signs that the taping is too tight. These signs may be:  Numbness of your taped toes.  Coolness of your taped toes.  Color change in the area beyond the tape.  Increased pain.  If you have any of these signs, loosen or rewrap the tape. If you need to loosen or rewrap the buddy tape, make sure you use the padding again. SEEK IMMEDIATE MEDICAL CARE IF:   You have worse pain, swelling, inflammation (soreness), drainage or bleeding after you rewrap the tape.  Any new problems occur. MAKE SURE YOU:   Understand these instructions.  Will watch your condition.  Will get help right away if you are not doing well or get worse. Document Released: 12/05/2003 Document Revised: 05/25/2011 Document Reviewed: 02/28/2008 [facility] Patient Information 2014 [employer], [place].

## 2013-04-30 NOTE — ED Notes (Signed)
States dropped a concrete deer on right first and second toe x 2 weeks- had wound that pt treated with peroxide and states that his toenail turned black and blue

## 2013-05-15 NOTE — ED Provider Notes (Signed)
Medical screening examination/treatment/procedure(s) were performed by non-physician practitioner and as supervising physician I was immediately available for consultation/collaboration.   EKG Interpretation None     \  Shelda JakesScott W. Arnie Clingenpeel, MD 05/15/13 1017

## 2013-07-10 ENCOUNTER — Encounter (HOSPITAL_BASED_OUTPATIENT_CLINIC_OR_DEPARTMENT_OTHER): Payer: Self-pay | Admitting: Emergency Medicine

## 2013-07-10 ENCOUNTER — Emergency Department (HOSPITAL_BASED_OUTPATIENT_CLINIC_OR_DEPARTMENT_OTHER)
Admission: EM | Admit: 2013-07-10 | Discharge: 2013-07-10 | Disposition: A | Discharge status: Home / Self Care | Payer: 59 | Attending: Emergency Medicine | Admitting: Emergency Medicine

## 2013-07-10 ENCOUNTER — Emergency Department (HOSPITAL_BASED_OUTPATIENT_CLINIC_OR_DEPARTMENT_OTHER): Payer: 59

## 2013-07-10 DIAGNOSIS — Z792 Long term (current) use of antibiotics: Secondary | ICD-10-CM | POA: Insufficient documentation

## 2013-07-10 DIAGNOSIS — Z8719 Personal history of other diseases of the digestive system: Secondary | ICD-10-CM | POA: Insufficient documentation

## 2013-07-10 DIAGNOSIS — R609 Edema, unspecified: Secondary | ICD-10-CM | POA: Insufficient documentation

## 2013-07-10 DIAGNOSIS — IMO0002 Reserved for concepts with insufficient information to code with codable children: Secondary | ICD-10-CM | POA: Insufficient documentation

## 2013-07-10 DIAGNOSIS — Z880 Allergy status to penicillin: Secondary | ICD-10-CM | POA: Insufficient documentation

## 2013-07-10 MED ORDER — ACETAMINOPHEN 500 MG PO TABS
1000.0000 mg | ORAL_TABLET | Freq: Once | ORAL | Status: AC
  Administered 2013-07-10: 1000 mg via ORAL
  Filled 2013-07-10: qty 2

## 2013-07-10 NOTE — ED Notes (Signed)
MD at bedside. 

## 2013-07-10 NOTE — ED Notes (Signed)
Pt c/o RLE (upper leg down to ankle) edema, pain, discomfort, decreased ROM, reports pain x1 month, but worsened today, states today was his only day off from work and therefore he was able to be seen in the ED. 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

## 2013-07-10 NOTE — ED Provider Notes (Signed)
CSN: [id number]     Arrival date & time 07/10/13  0610 History   First MD Initiated Contact with Patient 07/10/13 [phone]     Chief Complaint  Patient presents with  . Leg Pain  . Leg Swelling     (Consider location/radiation/quality/duration/timing/severity/associated sxs/prior Treatment) HPI Comments: 58 year old male with no significant medical history, nonsmoker presents with worsening right leg pain and swelling for the past month. 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 No history of blood clot, recent surgery, active cancer, recent injury. No fevers or rash. Patient had injury to right ankle in the past with intermittent pain since.    Patient is a 58 y.o. male presenting with leg pain. The history is provided by the patient.  Leg Pain Associated symptoms: no neck pain     Past Medical History  Diagnosis Date  . Hiatal hernia    Past Surgical History  Procedure Laterality Date  . Knee surgery Right     1974  . Esophagus surgery     History reviewed. No pertinent family history. History  Substance Use Topics  . Smoking status: Never Smoker   . Smokeless tobacco: Never Used  . Alcohol Use: No    Review of Systems  Respiratory: Negative for shortness of breath.   Cardiovascular: Positive for leg swelling. Negative for chest pain.  Gastrointestinal: Negative for vomiting.  Musculoskeletal: Negative for neck pain.  Skin: Negative for rash.      Allergies  Penicillins  Home Medications   Prior to Admission medications   Medication Sig Start Date End Date Taking? Authorizing Provider  mupirocin cream (BACTROBAN) 2 % Apply topically 3 (three) times daily. 09/23/12   Mora BellmanHannah S Merrell, PA-C  sulfamethoxazole-trimethoprim (BACTRIM DS) 800-160 MG per tablet Take 1 tablet by mouth 2 (two) times daily. 04/30/13   Hope Orlene OchM Neese, NP   BP 152/92  Pulse 65  Temp(Src) 98.1 F (36.7 C) (Oral)  Resp 19  Ht 5\' 6"  (1.676 m)  Wt 216 lb (97.977 kg)  BMI 34.88 kg/m2  SpO2 98% Physical Exam   Nursing note and vitals reviewed. Constitutional: He appears well-developed and well-nourished. No distress.  HENT:  Head: Normocephalic.  Cardiovascular: Normal rate and regular rhythm.   Pulmonary/Chest: Effort normal.  Musculoskeletal: He exhibits edema.  Neurological: He is alert.  Skin: Skin is warm.  Mild edema right lower extremity, few varicose veins. No focal tenderness. No signs of infection. Neurovascularly intact distal right lower extremity.  Psychiatric: He has a normal mood and affect.    ED Course  Procedures (including critical care time) Labs Review Labs Reviewed - No data to display  Imaging Review No results found.   EKG Interpretation None      MDM   Final diagnoses:  None   Well-appearing adult with isolated right leg swelling mild. 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 No other reasons for DVT. Plan for ultrasound of right lower extremity, Tylenol for pain and close followup outpatient. Patient will be signed out to followup ultrasound results.   Right lower ext pain/ swelling  Enid SkeensJoshua M Pola Furno, MD 07/10/13 [phone]

## 2013-07-10 NOTE — ED Provider Notes (Signed)
Care assumed at the change of shift pending Doppler which is neg. Pt advised PCP followup for further eval of his symptoms.   Edward B. Bernette MayersSheldon, MD 07/10/13 0900

## 2013-07-10 NOTE — ED Notes (Signed)
Pt returned from ultrasound. In NAD.

## 2013-07-10 NOTE — ED Notes (Signed)
Report to Lawson FiscalLori, RN, to assume care of patient at this time.

## 2013-07-10 NOTE — Discharge Instructions (Signed)

## 2013-07-19 ENCOUNTER — Ambulatory Visit (INDEPENDENT_AMBULATORY_CARE_PROVIDER_SITE_OTHER): Payer: 59 | Admitting: Physician Assistant

## 2013-07-19 ENCOUNTER — Encounter: Payer: Self-pay | Admitting: Physician Assistant

## 2013-07-19 VITALS — BP 144/90 | HR 73 | Temp 98.1°F | Resp 18 | Ht 66.0 in | Wt 263.5 lb

## 2013-07-19 DIAGNOSIS — Z1211 Encounter for screening for malignant neoplasm of colon: Secondary | ICD-10-CM

## 2013-07-19 DIAGNOSIS — IMO0002 Reserved for concepts with insufficient information to code with codable children: Secondary | ICD-10-CM

## 2013-07-19 DIAGNOSIS — M179 Osteoarthritis of knee, unspecified: Secondary | ICD-10-CM

## 2013-07-19 DIAGNOSIS — M1710 Unilateral primary osteoarthritis, unspecified knee: Secondary | ICD-10-CM

## 2013-07-19 DIAGNOSIS — I872 Venous insufficiency (chronic) (peripheral): Secondary | ICD-10-CM

## 2013-07-19 DIAGNOSIS — Z0000 Encounter for general adult medical examination without abnormal findings: Secondary | ICD-10-CM

## 2013-07-19 LAB — CBC WITH DIFFERENTIAL/PLATELET
BASOS ABS: 0.1 10*3/uL (ref 0.0–0.1)
Basophils Relative: 2 % — ABNORMAL HIGH (ref 0–1)
Eosinophils Absolute: 0.3 10*3/uL (ref 0.0–0.7)
Eosinophils Relative: 5 % (ref 0–5)
HEMATOCRIT: 43 % (ref 39.0–52.0)
Hemoglobin: 14.8 g/dL (ref 13.0–17.0)
LYMPHS PCT: 35 % (ref 12–46)
Lymphs Abs: 2.2 10*3/uL (ref 0.7–4.0)
MCH: 28.9 pg (ref 26.0–34.0)
MCHC: 34.4 g/dL (ref 30.0–36.0)
MCV: 84 fL (ref 78.0–100.0)
MONO ABS: 0.4 10*3/uL (ref 0.1–1.0)
Monocytes Relative: 6 % (ref 3–12)
NEUTROS ABS: 3.3 10*3/uL (ref 1.7–7.7)
NEUTROS PCT: 52 % (ref 43–77)
PLATELETS: 334 10*3/uL (ref 150–400)
RBC: 5.12 MIL/uL (ref 4.22–5.81)
RDW: 13.9 % (ref 11.5–15.5)
WBC: 6.3 10*3/uL (ref 4.0–10.5)

## 2013-07-19 LAB — HEMOGLOBIN A1C
Hgb A1c MFr Bld: 6 % — ABNORMAL HIGH (ref <5.7)
MEAN PLASMA GLUCOSE: 126 mg/dL — AB (ref <117)

## 2013-07-19 LAB — TSH: TSH: 1.302 u[IU]/mL (ref 0.350–4.500)

## 2013-07-19 MED ORDER — HYDROCODONE-ACETAMINOPHEN 10-325 MG PO TABS: 1.0000 | ORAL_TABLET | Freq: Three times a day (TID) | ORAL | Status: DC | PRN

## 2013-07-19 NOTE — Progress Notes (Signed)
Pre visit review using our clinic review tool, if applicable. No additional management support is needed unless otherwise documented below in the visit note/SLS  

## 2013-07-19 NOTE — Patient Instructions (Signed)
Please obtain labs.  I will call you with your results.  Please take Norco if needed for sever pain.  I am sending you to an orthopedic surgeon for further evaluation.  For leg swelling -- I am obtaining labs to assess your kidney function.  Limit salt intake.  Elevate legs at home.  Wear compression stockings daily.

## 2013-07-19 NOTE — Progress Notes (Signed)
Patient presents to clinic today to establish care.  Acute Concerns: Peripheral Edema -- RLE. Has been present intermittently over the past 3 months.  Patient was evaluated in the ER.  US was negative for DVT but noted varicose veins.  Patient denies recent surgery, prolonged immobilization, or long-distance travel.  Patient denies redness or swelling of joints of RLE.  Denies decreased ROM.  Patient has not taken anything for his symptoms.  Chronic Issues: Hx of hiatal hernia -- s/p Nissen.  Denies symptoms of reflux.  Hx of TIA -- denies MI or CVA.  Denies history of hyperlipidemia but has not seen a doctor in several years for physical and labs.  BP 144/90 in clinic today.  Asymptomatic.  Previous recent BPs taken in ER were all within normal limits.  Health Maintenance: Dental -- Overdue Vision -- UTD Immunizations -- Tetanus UTD.  Colonoscopy -- Never had.  Will refer to GI for consult.  Past Medical History  Diagnosis Date  . Hiatal hernia   . TIA (transient ischemic attack)     Nov and/or Dec 2014  . Edema     Past Surgical History  Procedure Laterality Date  . Knee surgery Right     1974  . Esophagus surgery    . Wisdom tooth extraction    . Tonsillectomy      No current outpatient prescriptions on file prior to visit.   No current facility-administered medications on file prior to visit.    Allergies  Allergen Reactions  . Penicillins Other (See Comments)    Syncope, hypotension    Family History  Problem Relation Age of Onset  . Hypertension Mother     Living  . Hypertension Father   . Hypertension Brother   . Stroke Brother [id number]    Deceased  . Cancer - Other Mother     Oral  . Alzheimer's disease Father     Living  . Alzheimer's disease Paternal Aunt   . Stroke Paternal Aunt     #1  . Dementia Paternal Aunt     #2  . Healthy Brother     x1  . Healthy Son     x3    History   Social History  . Marital Status: Married    Spouse Name: N/A    Number of Children: N/A  . Years of Education: N/A   Occupational History  . Not on file.   Social History Main Topics  . Smoking status: Never Smoker   . Smokeless tobacco: Never Used  . Alcohol Use: No  . Drug Use: No  . Sexual Activity: Not on file   Other Topics Concern  . Not on file   Social History Narrative  . No narrative on file   Review of Systems  Constitutional: Negative for fever and weight loss.  HENT: Negative for ear discharge, ear pain, hearing loss and tinnitus.   Eyes: Negative for blurred vision, double vision, photophobia and pain.  Respiratory: Negative for cough and shortness of breath.   Cardiovascular: Negative for chest pain and palpitations.  Gastrointestinal: Positive for melena. Negative for heartburn, nausea, vomiting, abdominal pain, diarrhea, constipation and blood in stool.  Genitourinary: Negative for dysuria, urgency, frequency, hematuria and flank pain.       Nocturia x 0.  Neurological: Negative for dizziness, loss of consciousness and headaches.  Psychiatric/Behavioral: Negative for depression, suicidal ideas, hallucinations and substance abuse. The patient is not nervous/anxious and does not have insomnia.  BP 144/90  Pulse 73  Temp(Src) 98.1 F (36.7 C) (Oral)  Resp 18  Ht 5\' 6"  (1.676 m)  Wt 263 lb 8 oz (119.523 kg)  BMI 42.55 kg/m2  SpO2 97%  Physical Exam  Vitals reviewed. Constitutional: He is oriented to person, place, and time and well-developed, well-nourished, and in no distress.  HENT:  Head: Normocephalic and atraumatic.  Right Ear: External ear normal.  Left Ear: External ear normal.  Nose: Nose normal.  Mouth/Throat: Oropharynx is clear and moist. No oropharyngeal exudate.  Eyes: Conjunctivae and EOM are normal. Pupils are equal, round, and reactive to light.  Neck: Neck supple.  Cardiovascular: Normal rate, regular rhythm, normal heart sounds and intact distal pulses.   No murmur heard. PT and DP Pulses 2+  and equal bilaterally.  Varicosities of RLE noted on examination.  Trace, nonpitting edema noted up to level of shin of RLE. No edema noted on LLE.  Pulmonary/Chest: Effort normal and breath sounds normal. No respiratory distress. He has no wheezes. He has no rales. He exhibits no tenderness.  Abdominal: Soft. Bowel sounds are normal. He exhibits no distension and no mass. There is no tenderness. There is no rebound and no guarding.  Neurological: He is alert and oriented to person, place, and time. No cranial nerve deficit.  Skin: Skin is warm and dry. No rash noted.  Psychiatric: Affect normal.   Assessment/Plan: Visit for preventive health examination Medical history reviewed and updated.  Will obtain fasting labs.  Patient overdue for colonoscopy.  Referral placed to GI for consult. Immunizations UTD.  OA (osteoarthritis) of knee Chronic.  Refilled Norco.  Referral placed to orthopedics for further evaluation.  Patient to obtain his old records and imaging to present to specialist.  Venous insufficiency of right lower extremity Varicosity noted on examination.  Doppler US negative for DVT.  Rx Compression stockings.  Elevate legs at home.  Limit salt intake. If symptoms persist, despite conservative measures, may need to initiate small dose of diuretic.

## 2013-07-20 LAB — URINALYSIS, MICROSCOPIC ONLY
Bacteria, UA: NONE SEEN
CASTS: NONE SEEN
CRYSTALS: NONE SEEN
Squamous Epithelial / LPF: NONE SEEN

## 2013-07-20 LAB — URINALYSIS, ROUTINE W REFLEX MICROSCOPIC
Bilirubin Urine: NEGATIVE
Glucose, UA: NEGATIVE mg/dL
Hgb urine dipstick: NEGATIVE
LEUKOCYTES UA: NEGATIVE
NITRITE: NEGATIVE
PH: 5 (ref 5.0–8.0)
Protein, ur: NEGATIVE mg/dL
SPECIFIC GRAVITY, URINE: 1.029 (ref 1.005–1.030)
UROBILINOGEN UA: 0.2 mg/dL (ref 0.0–1.0)

## 2013-07-20 LAB — HEPATIC FUNCTION PANEL
ALK PHOS: 58 U/L (ref 39–117)
ALT: 32 U/L (ref 0–53)
AST: 11 U/L (ref 0–37)
Albumin: 4.2 g/dL (ref 3.5–5.2)
BILIRUBIN DIRECT: 0.1 mg/dL (ref 0.0–0.3)
BILIRUBIN INDIRECT: 0.3 mg/dL (ref 0.2–1.2)
BILIRUBIN TOTAL: 0.4 mg/dL (ref 0.2–1.2)
Total Protein: 7 g/dL (ref 6.0–8.3)

## 2013-07-20 LAB — LIPID PANEL
Cholesterol: 212 mg/dL — ABNORMAL HIGH (ref 0–200)
HDL: 38 mg/dL — AB (ref >39)
LDL Cholesterol: 115 mg/dL — ABNORMAL HIGH (ref 0–99)
TRIGLYCERIDES: 296 mg/dL — AB (ref <150)
Total CHOL/HDL Ratio: 5.6 Ratio
VLDL: 59 mg/dL — ABNORMAL HIGH (ref 0–40)

## 2013-07-20 LAB — BASIC METABOLIC PANEL WITH GFR
BUN: 14 mg/dL (ref 6–23)
CHLORIDE: 106 meq/L (ref 96–112)
CO2: 23 mEq/L (ref 19–32)
Calcium: 9.2 mg/dL (ref 8.4–10.5)
Creat: 0.99 mg/dL (ref 0.50–1.35)
GFR, EST NON AFRICAN AMERICAN: 84 mL/min
GFR, Est African American: >89 mL/min
Glucose, Bld: 101 mg/dL — ABNORMAL HIGH (ref 70–99)
POTASSIUM: 4.7 meq/L (ref 3.5–5.3)
SODIUM: 140 meq/L (ref 135–145)

## 2013-07-20 LAB — PSA: PSA: 1.95 ng/mL (ref <=4.00)

## 2013-07-21 ENCOUNTER — Telehealth: Payer: Self-pay | Admitting: Physician Assistant

## 2013-07-24 DIAGNOSIS — Z0000 Encounter for general adult medical examination without abnormal findings: Secondary | ICD-10-CM | POA: Insufficient documentation

## 2013-07-24 DIAGNOSIS — M1710 Unilateral primary osteoarthritis, unspecified knee: Secondary | ICD-10-CM | POA: Insufficient documentation

## 2013-07-24 DIAGNOSIS — M179 Osteoarthritis of knee, unspecified: Secondary | ICD-10-CM | POA: Insufficient documentation

## 2013-07-24 DIAGNOSIS — I872 Venous insufficiency (chronic) (peripheral): Secondary | ICD-10-CM | POA: Insufficient documentation

## 2013-07-24 NOTE — Assessment & Plan Note (Signed)
Varicosity noted on examination.  Doppler US negative for DVT.  Rx Compression stockings.  Elevate legs at home.  Limit salt intake. If symptoms persist, despite conservative measures, may need to initiate small dose of diuretic.

## 2013-07-24 NOTE — Assessment & Plan Note (Signed)
Chronic.  Refilled Norco.  Referral placed to orthopedics for further evaluation.  Patient to obtain his old records and imaging to present to specialist.

## 2013-07-24 NOTE — Assessment & Plan Note (Signed)
Medical history reviewed and updated.  Will obtain fasting labs.  Patient overdue for colonoscopy.  Referral placed to GI for consult. Immunizations UTD.

## 2013-07-26 ENCOUNTER — Telehealth: Payer: Self-pay | Admitting: Physician Assistant

## 2013-07-26 NOTE — Telephone Encounter (Signed)
Notes Recorded by Regis BillSharon L Scates, CMA on 07/24/2013 at 5:29 PM [facility] with contact name and number for return call RE: results and further provider instructions/SLS   PLEASE SEE LAB RESULT NOTE/SLS

## 2013-07-26 NOTE — Telephone Encounter (Signed)
Patient is requesting last lab results 

## 2013-08-02 ENCOUNTER — Encounter: Payer: Self-pay | Admitting: Physician Assistant

## 2013-08-02 ENCOUNTER — Ambulatory Visit (HOSPITAL_BASED_OUTPATIENT_CLINIC_OR_DEPARTMENT_OTHER)
Admission: RE | Admit: 2013-08-02 | Discharge: 2013-08-02 | Disposition: A | Discharge status: Home / Self Care | Payer: 59 | Source: Ambulatory Visit | Attending: Physician Assistant | Admitting: Physician Assistant

## 2013-08-02 ENCOUNTER — Other Ambulatory Visit: Payer: Self-pay | Admitting: Physician Assistant

## 2013-08-02 ENCOUNTER — Ambulatory Visit (INDEPENDENT_AMBULATORY_CARE_PROVIDER_SITE_OTHER): Payer: 59 | Admitting: Physician Assistant

## 2013-08-02 VITALS — BP 128/88 | HR 61 | Temp 98.1°F | Resp 16 | Ht 66.0 in | Wt 267.2 lb

## 2013-08-02 DIAGNOSIS — M25571 Pain in right ankle and joints of right foot: Secondary | ICD-10-CM

## 2013-08-02 DIAGNOSIS — G459 Transient cerebral ischemic attack, unspecified: Secondary | ICD-10-CM | POA: Insufficient documentation

## 2013-08-02 DIAGNOSIS — F419 Anxiety disorder, unspecified: Secondary | ICD-10-CM | POA: Insufficient documentation

## 2013-08-02 DIAGNOSIS — F411 Generalized anxiety disorder: Secondary | ICD-10-CM

## 2013-08-02 DIAGNOSIS — I872 Venous insufficiency (chronic) (peripheral): Secondary | ICD-10-CM

## 2013-08-02 DIAGNOSIS — M259 Joint disorder, unspecified: Secondary | ICD-10-CM | POA: Insufficient documentation

## 2013-08-02 DIAGNOSIS — M25579 Pain in unspecified ankle and joints of unspecified foot: Secondary | ICD-10-CM

## 2013-08-02 MED ORDER — DIAZEPAM 2 MG PO TABS: 1.0000 mg | ORAL_TABLET | Freq: Two times a day (BID) | ORAL | Status: DC | PRN

## 2013-08-02 MED ORDER — CITALOPRAM HYDROBROMIDE 10 MG PO TABS: ORAL_TABLET | ORAL | Status: DC

## 2013-08-02 NOTE — Telephone Encounter (Signed)
Error

## 2013-08-02 NOTE — Assessment & Plan Note (Signed)
Patient to wear compression stockings as directed.  Elevate legs at home.

## 2013-08-02 NOTE — Assessment & Plan Note (Signed)
Hx of. Continue ASA 81 mg.  BP well controlled. Will send to Neurology for further assessment.

## 2013-08-02 NOTE — Progress Notes (Signed)
Patient presents to clinic today to discuss medication for anxiety.  Patient endorse several episodes of acute anxiety with chest tightness.  Denies chest pain, SOB or palpitations.  Endorses anhedonia but denies depressed mood or history of depression.  Denies SI/HI.  Patient also endorses occasional blurring of vision and tingling of face.  Lase episode 2 weeks ago.  Has history of TIA.  Is currently on 81 mg ASA daily.  Recent LDL at 115.  Trying TLC at present but is aware he may need stating therapy after repeat Lipid panel.    Patient also needs referral to Orthopedics again as he had a change with his insurance card.  Is still endorsing knee, leg and ankle pain.  US unremarkable.  Has not tried compression stockings for venous insufficiency.   Past Medical History  Diagnosis Date  . Hiatal hernia   . TIA (transient ischemic attack)     Nov and/or Dec 2014  . Edema    Current Outpatient Prescriptions on File Prior to Visit  Medication Sig Dispense Refill  . HYDROcodone-acetaminophen (NORCO) 10-325 MG per tablet Take 1 tablet by mouth every 8 (eight) hours as needed.  40 tablet  0  . ibuprofen (ADVIL,MOTRIN) 200 MG tablet Take 200 mg by mouth every 6 (six) hours as needed for moderate pain.       No current facility-administered medications on file prior to visit.    Allergies  Allergen Reactions  . Penicillins Other (See Comments)    Syncope, hypotension    Family History  Problem Relation Age of Onset  . Hypertension Mother     Living  . Hypertension Father   . Hypertension Brother   . Stroke Brother 31    Deceased  . Cancer - Other Mother     Oral  . Alzheimer's disease Father     Living  . Alzheimer's disease Paternal Aunt   . Stroke Paternal Aunt     #1  . Dementia Paternal Aunt     #2  . Healthy Brother     x1  . Healthy Son     x3    History   Social History  . Marital Status: Married    Spouse Name: N/A    Number of Children: N/A  . Years of  Education: N/A   Social History Main Topics  . Smoking status: Never Smoker   . Smokeless tobacco: Never Used  . Alcohol Use: No  . Drug Use: No  . Sexual Activity: None   Other Topics Concern  . None   Social History Narrative  . None   Review of Systems - See HPI.  All other ROS are negative.  BP 128/88  Pulse 61  Temp(Src) 98.1 F (36.7 C) (Oral)  Resp 16  Ht 5' 6"  (1.676 m)  Wt 267 lb 4 oz (121.224 kg)  BMI 43.16 kg/m2  SpO2 98%  Physical Exam  Vitals reviewed. Constitutional: He is oriented to person, place, and time and well-developed, well-nourished, and in no distress.  HENT:  Head: Normocephalic and atraumatic.  Right Ear: External ear normal.  Left Ear: External ear normal.  Nose: Nose normal.  Mouth/Throat: Oropharynx is clear and moist.  Eyes: Conjunctivae are normal. Pupils are equal, round, and reactive to light.  Neck: Neck supple.  Cardiovascular: Normal rate, regular rhythm, normal heart sounds and intact distal pulses.   No bruits auscultated. Venous varicosities of RLE noted with mild edema.    Pulmonary/Chest: Effort normal and  breath sounds normal. No respiratory distress. He has no wheezes. He has no rales. He exhibits no tenderness.  Musculoskeletal:       Right ankle: He exhibits swelling. He exhibits normal range of motion, no ecchymosis, no deformity, no laceration and normal pulse. Tenderness. Achilles tendon normal.  Lymphadenopathy:    He has no cervical adenopathy.  Neurological: He is alert and oriented to person, place, and time. He has normal sensation, normal strength and intact cranial nerves. He displays no weakness, facial symmetry and normal stance. Gait normal. Coordination normal. GCS score is 15.  Skin: Skin is warm and dry. No rash noted.   Recent Results (from the past 2160 hour(s))  CBC WITH DIFFERENTIAL     Status: Abnormal   Collection Time    07/19/13  3:40 PM      Result Value Ref Range   WBC 6.3  4.0 - 10.5 K/uL    RBC 5.12  4.22 - 5.81 MIL/uL   Hemoglobin 14.8  13.0 - 17.0 g/dL   HCT 43.0  39.0 - 52.0 %   MCV 84.0  78.0 - 100.0 fL   MCH 28.9  26.0 - 34.0 pg   MCHC 34.4  30.0 - 36.0 g/dL   RDW 13.9  11.5 - 15.5 %   Platelets 334  150 - 400 K/uL   Neutrophils Relative % 52  43 - 77 %   Neutro Abs 3.3  1.7 - 7.7 K/uL   Lymphocytes Relative 35  12 - 46 %   Lymphs Abs 2.2  0.7 - 4.0 K/uL   Monocytes Relative 6  3 - 12 %   Monocytes Absolute 0.4  0.1 - 1.0 K/uL   Eosinophils Relative 5  0 - 5 %   Eosinophils Absolute 0.3  0.0 - 0.7 K/uL   Basophils Relative 2 (*) 0 - 1 %   Basophils Absolute 0.1  0.0 - 0.1 K/uL   Smear Review Criteria for review not met    BASIC METABOLIC PANEL WITH GFR     Status: Abnormal   Collection Time    07/19/13  3:40 PM      Result Value Ref Range   Sodium 140  135 - 145 mEq/L   Potassium 4.7  3.5 - 5.3 mEq/L   Chloride 106  96 - 112 mEq/L   CO2 23  19 - 32 mEq/L   Glucose, Bld 101 (*) 70 - 99 mg/dL   BUN 14  6 - 23 mg/dL   Creat 0.99  0.50 - 1.35 mg/dL   Calcium 9.2  8.4 - 10.5 mg/dL   GFR, Est African American >89     GFR, Est Non African American 84     Comment:       The estimated GFR is a calculation valid for adults (>=53 years old)     that uses the CKD-EPI algorithm to adjust for age and sex. It is       not to be used for children, pregnant women, hospitalized patients,        patients on dialysis, or with rapidly changing kidney function.     According to the NKDEP, eGFR >89 is normal, 60-89 shows mild     impairment, 30-59 shows moderate impairment, 15-29 shows severe     impairment and <15 is ESRD.        HEPATIC FUNCTION PANEL     Status: None   Collection Time    07/19/13  3:40 PM  Result Value Ref Range   Total Bilirubin 0.4  0.2 - 1.2 mg/dL   Bilirubin, Direct 0.1  0.0 - 0.3 mg/dL   Indirect Bilirubin 0.3  0.2 - 1.2 mg/dL   Alkaline Phosphatase 58  39 - 117 U/L   AST 11  0 - 37 U/L   ALT 32  0 - 53 U/L   Total Protein 7.0  6.0 - 8.3  g/dL   Albumin 4.2  3.5 - 5.2 g/dL  TSH     Status: None   Collection Time    07/19/13  3:40 PM      Result Value Ref Range   TSH 1.302  0.350 - 4.500 uIU/mL  HEMOGLOBIN A1C     Status: Abnormal   Collection Time    07/19/13  3:40 PM      Result Value Ref Range   Hemoglobin A1C 6.0 (*) <5.7 %   Comment:                                                                            According to the ADA Clinical Practice Recommendations for 2011, when     HbA1c is used as a screening test:             >=6.5%   Diagnostic of Diabetes Mellitus                (if abnormal result is confirmed)           5.7-6.4%   Increased risk of developing Diabetes Mellitus           References:Diagnosis and Classification of Diabetes Mellitus,Diabetes     FBPZ,0258,52(DPOEU 1):S62-S69 and Standards of Medical Care in             Diabetes - 2011,Diabetes Care,2011,34 (Suppl 1):S11-S61.         Mean Plasma Glucose 126 (*) <117 mg/dL  URINALYSIS, ROUTINE W REFLEX MICROSCOPIC     Status: Abnormal   Collection Time    07/19/13  3:40 PM      Result Value Ref Range   Color, Urine YELLOW  YELLOW   APPearance TURBID (*) CLEAR   Specific Gravity, Urine 1.029  1.005 - 1.030   pH 5.0  5.0 - 8.0   Glucose, UA NEG  NEG mg/dL   Bilirubin Urine NEG  NEG   Ketones, ur TRACE (*) NEG mg/dL   Hgb urine dipstick NEG  NEG   Protein, ur NEG  NEG mg/dL   Urobilinogen, UA 0.2  0.0 - 1.0 mg/dL   Nitrite NEG  NEG   Leukocytes, UA NEG  NEG  LIPID PANEL     Status: Abnormal   Collection Time    07/19/13  3:40 PM      Result Value Ref Range   Cholesterol 212 (*) 0 - 200 mg/dL   Comment: ATP III Classification:           < 200        mg/dL        Desirable          200 - 239     mg/dL        Borderline High          >=  240        mg/dL        High         Triglycerides 296 (*) <150 mg/dL   HDL 38 (*) >39 mg/dL   Total CHOL/HDL Ratio 5.6     VLDL 59 (*) 0 - 40 mg/dL   LDL Cholesterol 115 (*) 0 - 99 mg/dL    Comment:       Total Cholesterol/HDL Ratio:CHD Risk                            Coronary Heart Disease Risk Table                                            Men       Women              1/2 Average Risk              3.4        3.3                  Average Risk              5.0        4.4               2X Average Risk              9.6        7.1               3X Average Risk             23.4       11.0     Use the calculated Patient Ratio above and the CHD Risk table      to determine the patient's CHD Risk.     ATP III Classification (LDL):           < 100        mg/dL         Optimal          100 - 129     mg/dL         Near or Above Optimal          130 - 159     mg/dL         Borderline High          160 - 189     mg/dL         High           > 190        mg/dL         Very High        PSA     Status: None   Collection Time    07/19/13  3:40 PM      Result Value Ref Range   PSA 1.95  <=4.00 ng/mL   Comment: Test Methodology: ECLIA PSA (Electrochemiluminescence Immunoassay)           For PSA values from 2.5-4.0, particularly in younger men <60 years     old, the AUA and NCCN suggest testing for % Free PSA (3515) and     evaluation of the rate of increase in PSA (PSA velocity).  URINALYSIS, MICROSCOPIC ONLY  Status: None   Collection Time    07/19/13  3:40 PM      Result Value Ref Range   Squamous Epithelial / LPF NONE SEEN  RARE   Crystals NONE SEEN  NONE SEEN   Casts NONE SEEN  NONE SEEN   WBC, UA 0-2  <3 WBC/hpf   RBC / HPF 0-2  <3 RBC/hpf   Bacteria, UA NONE SEEN  RARE   Assessment/Plan: Venous insufficiency of right lower extremity Patient to wear compression stockings as directed.  Elevate legs at home.  TIA (transient ischemic attack) Hx of. Continue ASA 81 mg.  BP well controlled. Will send to Neurology for further assessment.   Anxiety Rx Valium.  Rx Citalopram. Will titrate up on Citalopram and wean off of Valium. Follow-up in 1 month.  Right ankle  pain Will obtain x-ray.  Referral placed to Ortho giving chronicity of pain and coexisting knee and leg pain.

## 2013-08-02 NOTE — Patient Instructions (Signed)
Please take new medications as directed.  Also, start a daily 81 mg aspirin (baby aspirin).  Take fish oil as directed.  Please go downstairs for x-ray. I will call you with your results.  You will be contacted by Orthopedics and Neurology for appointments.  Follow-up in 1 month.

## 2013-08-02 NOTE — Assessment & Plan Note (Signed)
Will obtain x-ray.  Referral placed to Ortho giving chronicity of pain and coexisting knee and leg pain.

## 2013-08-02 NOTE — Assessment & Plan Note (Signed)
Rx Valium.  Rx Citalopram. Will titrate up on Citalopram and wean off of Valium. Follow-up in 1 month.

## 2013-08-02 NOTE — Progress Notes (Signed)
Pre visit review using our clinic review tool, if applicable. No additional management support is needed unless otherwise documented below in the visit note/SLS  

## 2013-08-14 ENCOUNTER — Telehealth: Payer: Self-pay | Admitting: Physician Assistant

## 2013-08-14 DIAGNOSIS — M1710 Unilateral primary osteoarthritis, unspecified knee: Secondary | ICD-10-CM

## 2013-08-14 DIAGNOSIS — M179 Osteoarthritis of knee, unspecified: Secondary | ICD-10-CM

## 2013-08-14 NOTE — Telephone Encounter (Signed)
Please Advise

## 2013-08-14 NOTE — Telephone Encounter (Signed)
Patient would like to know if Selena Batten would prescribe him something stronger to help with the ankle pain and to help him sleep. [facility] [address]

## 2013-08-15 MED ORDER — HYDROCODONE-ACETAMINOPHEN 10-325 MG PO TABS: 1.0000 | ORAL_TABLET | Freq: Four times a day (QID) | ORAL | Status: DC | PRN

## 2013-08-15 NOTE — Telephone Encounter (Signed)
Returning call, request call back @ [phone]

## 2013-08-15 NOTE — Telephone Encounter (Signed)
Patient was notified. Patient stated that he has been taking Valium. Pt said that will take Valium at bedtime. Patient also stated that he went to orthopedic appt 08/14/2013.

## 2013-08-15 NOTE — Telephone Encounter (Signed)
I will refill his Norco with more frequent dosing.  He will have to pick up medication. For sleep, he is already on Valium.  Is he taking it at bedtime? It will help with sleep.  I will not prescribe anything stronger without him coming in for a repeat examination.  I have set him up with Orthopedics and it shows he canceled his appointment. It is his responsibility to schedule an appointment.

## 2013-08-15 NOTE — Telephone Encounter (Signed)
LMOVM for pt to return call 

## 2013-08-16 ENCOUNTER — Other Ambulatory Visit: Payer: Self-pay | Admitting: Orthopedic Surgery

## 2013-08-16 DIAGNOSIS — R609 Edema, unspecified: Secondary | ICD-10-CM

## 2013-08-16 DIAGNOSIS — R531 Weakness: Secondary | ICD-10-CM

## 2013-08-16 DIAGNOSIS — M25571 Pain in right ankle and joints of right foot: Secondary | ICD-10-CM

## 2013-08-21 ENCOUNTER — Ambulatory Visit
Admission: RE | Admit: 2013-08-21 | Discharge: 2013-08-21 | Disposition: A | Discharge status: Home / Self Care | Payer: 59 | Source: Ambulatory Visit | Attending: Orthopedic Surgery | Admitting: Orthopedic Surgery

## 2013-08-21 DIAGNOSIS — R609 Edema, unspecified: Secondary | ICD-10-CM

## 2013-08-21 DIAGNOSIS — M25571 Pain in right ankle and joints of right foot: Secondary | ICD-10-CM

## 2013-08-21 DIAGNOSIS — R531 Weakness: Secondary | ICD-10-CM

## 2013-08-28 ENCOUNTER — Encounter: Payer: Self-pay | Admitting: Physician Assistant

## 2013-08-31 ENCOUNTER — Encounter: Payer: Self-pay | Admitting: *Deleted

## 2013-09-05 ENCOUNTER — Telehealth: Payer: Self-pay | Admitting: Physician Assistant

## 2013-09-05 NOTE — Telephone Encounter (Signed)
Patient is requesting a new vicoden rx

## 2013-09-06 ENCOUNTER — Telehealth: Payer: Self-pay | Admitting: Physician Assistant

## 2013-09-06 ENCOUNTER — Ambulatory Visit: Payer: 59 | Admitting: Physician Assistant

## 2013-09-06 ENCOUNTER — Ambulatory Visit: Payer: 59 | Admitting: Neurology

## 2013-09-06 DIAGNOSIS — Z0289 Encounter for other administrative examinations: Secondary | ICD-10-CM

## 2013-09-06 NOTE — Telephone Encounter (Signed)
Patient No Show 06.24.15 appointment & medication is not due until 07.02.15 for refill/SLS Please call patient and ask him to reschedule appointment by next week. Thanks/SLS

## 2013-09-06 NOTE — Telephone Encounter (Signed)
Patient was scheduled for a follow up at 915 and did not come

## 2013-09-06 NOTE — Telephone Encounter (Signed)
Please call patient to have him reschedule.  He is overdue for a follow-up for anxiety.

## 2013-09-06 NOTE — Telephone Encounter (Signed)
Please see previous note. I have already left him a message to return my call!

## 2013-09-06 NOTE — Telephone Encounter (Signed)
Left message for patient to return my call.

## 2013-09-07 ENCOUNTER — Encounter: Payer: Self-pay | Admitting: Neurology

## 2013-09-07 ENCOUNTER — Telehealth: Payer: Self-pay | Admitting: Neurology

## 2013-09-07 NOTE — Telephone Encounter (Signed)
Pt no showed 09/06/13 NP appt w/ Dr. Everlena CooperJaffe. Referring office notified via [facility] referral message. No show letter mailed to pt / Sherri S.

## 2013-09-11 NOTE — Telephone Encounter (Signed)
Called scheduler that made appt for 07.08.15 & took request for Hydrocodone to reiterate pt No Show appt w/PCP & Ortho; cannot have Rx w/o appointment, she will inform pt/SLS

## 2013-09-11 NOTE — Telephone Encounter (Signed)
Left message for patient to return my call.

## 2013-09-11 NOTE — Telephone Encounter (Signed)
Would like to pick up the rx for hydrocodone on 09-14-2013

## 2013-09-11 NOTE — Telephone Encounter (Signed)
DUPLICATE NOTES: [Original from 06.23.15]

## 2013-09-20 ENCOUNTER — Ambulatory Visit: Payer: 59 | Admitting: Physician Assistant

## 2013-09-20 ENCOUNTER — Telehealth: Payer: Self-pay | Admitting: Physician Assistant

## 2013-09-20 DIAGNOSIS — Z0289 Encounter for other administrative examinations: Secondary | ICD-10-CM

## 2013-09-20 NOTE — Telephone Encounter (Signed)
It is patient's responsibility to reschedule if he chooses to do so.  Patient was follow-up for pain and anxiety.  No refills without follow-up.  Patient has no-showed twice.  If he no-shows a third time, he will be dismissed from practice.

## 2013-09-20 NOTE — Telephone Encounter (Signed)
Mailbox full, could not leave message.

## 2013-09-20 NOTE — Telephone Encounter (Signed)
Patient was a no show for today's appointment

## 2013-12-09 ENCOUNTER — Emergency Department (HOSPITAL_BASED_OUTPATIENT_CLINIC_OR_DEPARTMENT_OTHER): Payer: 59

## 2013-12-09 ENCOUNTER — Emergency Department (HOSPITAL_BASED_OUTPATIENT_CLINIC_OR_DEPARTMENT_OTHER)
Admission: EM | Admit: 2013-12-09 | Discharge: 2013-12-10 | Disposition: A | Discharge status: Home / Self Care | Payer: 59 | Attending: Emergency Medicine | Admitting: Emergency Medicine

## 2013-12-09 ENCOUNTER — Encounter (HOSPITAL_BASED_OUTPATIENT_CLINIC_OR_DEPARTMENT_OTHER): Payer: Self-pay | Admitting: Emergency Medicine

## 2013-12-09 DIAGNOSIS — J209 Acute bronchitis, unspecified: Secondary | ICD-10-CM | POA: Diagnosis not present

## 2013-12-09 DIAGNOSIS — R197 Diarrhea, unspecified: Secondary | ICD-10-CM | POA: Diagnosis present

## 2013-12-09 DIAGNOSIS — Z8719 Personal history of other diseases of the digestive system: Secondary | ICD-10-CM | POA: Insufficient documentation

## 2013-12-09 DIAGNOSIS — Z880 Allergy status to penicillin: Secondary | ICD-10-CM | POA: Diagnosis not present

## 2013-12-09 DIAGNOSIS — Z8673 Personal history of transient ischemic attack (TIA), and cerebral infarction without residual deficits: Secondary | ICD-10-CM | POA: Diagnosis not present

## 2013-12-09 DIAGNOSIS — I872 Venous insufficiency (chronic) (peripheral): Secondary | ICD-10-CM | POA: Diagnosis not present

## 2013-12-09 DIAGNOSIS — R609 Edema, unspecified: Secondary | ICD-10-CM | POA: Diagnosis not present

## 2013-12-09 DIAGNOSIS — Z79899 Other long term (current) drug therapy: Secondary | ICD-10-CM | POA: Diagnosis not present

## 2013-12-09 DIAGNOSIS — R195 Other fecal abnormalities: Secondary | ICD-10-CM | POA: Diagnosis not present

## 2013-12-09 LAB — URINALYSIS, ROUTINE W REFLEX MICROSCOPIC
Bilirubin Urine: NEGATIVE
Glucose, UA: NEGATIVE mg/dL
HGB URINE DIPSTICK: NEGATIVE
Ketones, ur: NEGATIVE mg/dL
Leukocytes, UA: NEGATIVE
NITRITE: NEGATIVE
Protein, ur: NEGATIVE mg/dL
Specific Gravity, Urine: 1.024 (ref 1.005–1.030)
UROBILINOGEN UA: 1 mg/dL (ref 0.0–1.0)
pH: 5 (ref 5.0–8.0)

## 2013-12-09 MED ORDER — IPRATROPIUM-ALBUTEROL 0.5-2.5 (3) MG/3ML IN SOLN
3.0000 mL | RESPIRATORY_TRACT | Status: DC
  Administered 2013-12-09: 3 mL via RESPIRATORY_TRACT
  Filled 2013-12-09: qty 3

## 2013-12-09 MED ORDER — ALBUTEROL SULFATE HFA 108 (90 BASE) MCG/ACT IN AERS: 1.0000 | INHALATION_SPRAY | Freq: Four times a day (QID) | RESPIRATORY_TRACT | Status: DC | PRN

## 2013-12-09 MED ORDER — AZITHROMYCIN 250 MG PO TABS: ORAL_TABLET | ORAL | Status: DC

## 2013-12-09 MED ORDER — FUROSEMIDE 20 MG PO TABS: 20.0000 mg | ORAL_TABLET | Freq: Every day | ORAL | Status: DC

## 2013-12-09 NOTE — ED Notes (Signed)
Pt reports diarrhea every evening x 3 weeks.  Reports dry cough x 2 weeks.

## 2013-12-09 NOTE — ED Notes (Signed)
Called for triage x1.

## 2013-12-09 NOTE — Discharge Instructions (Signed)
Acute Bronchitis Bronchitis is inflammation of the airways that extend from the windpipe into the lungs (bronchi). The inflammation often causes mucus to develop. This leads to a cough, which is the most common symptom of bronchitis.  In acute bronchitis, the condition usually develops suddenly and goes away over time, usually in a couple weeks. Smoking, allergies, and asthma can make bronchitis worse. Repeated episodes of bronchitis may cause further lung problems.  CAUSES Acute bronchitis is most often caused by the same virus that causes a cold. The virus can spread from person to person (contagious) through coughing, sneezing, and touching contaminated objects. SIGNS AND SYMPTOMS   Cough.   Fever.   Coughing up mucus.   Body aches.   Chest congestion.   Chills.   Shortness of breath.   Sore throat.  DIAGNOSIS  Acute bronchitis is usually diagnosed through a physical exam. Your health care provider will also ask you questions about your medical history. Tests, such as chest X-rays, are sometimes done to rule out other conditions.  TREATMENT  Acute bronchitis usually goes away in a couple weeks. Oftentimes, no medical treatment is necessary. Medicines are sometimes given for relief of fever or cough. Antibiotic medicines are usually not needed but may be prescribed in certain situations. In some cases, an inhaler may be recommended to help reduce shortness of breath and control the cough. A cool mist vaporizer may also be used to help thin bronchial secretions and make it easier to clear the chest.  HOME CARE INSTRUCTIONS  Get plenty of rest.   Drink enough fluids to keep your urine clear or pale yellow (unless you have a medical condition that requires fluid restriction). Increasing fluids may help thin your respiratory secretions (sputum) and reduce chest congestion, and it will prevent dehydration.   Take medicines only as directed by your health care provider.  If  you were prescribed an antibiotic medicine, finish it all even if you start to feel better.  Avoid smoking and secondhand smoke. Exposure to cigarette smoke or irritating chemicals will make bronchitis worse. If you are a smoker, consider using nicotine gum or skin patches to help control withdrawal symptoms. Quitting smoking will help your lungs heal faster.   Reduce the chances of another bout of acute bronchitis by washing your hands frequently, avoiding people with cold symptoms, and trying not to touch your hands to your mouth, nose, or eyes.   Keep all follow-up visits as directed by your health care provider.  SEEK MEDICAL CARE IF: Your symptoms do not improve after 1 week of treatment.  SEEK IMMEDIATE MEDICAL CARE IF:  You develop an increased fever or chills.   You have chest pain.   You have severe shortness of breath.  You have bloody sputum.   You develop dehydration.  You faint or repeatedly feel like you are going to pass out.  You develop repeated vomiting.  You develop a severe headache. MAKE SURE YOU:   Understand these instructions.  Will watch your condition.  Will get help right away if you are not doing well or get worse. Document Released: 04/09/2004 Document Revised: 07/17/2013 Document Reviewed: 08/23/2012 [facility] Patient Information 2015 [place], [place]. This information is not intended to replace advice given to you by your health care provider. Make sure you discuss any questions you have with your health care provider. Venous Stasis or Chronic Venous Insufficiency Chronic venous insufficiency, also called venous stasis, is a condition that affects the veins in the legs. The  condition prevents blood from being pumped through these veins effectively. Blood may no longer be pumped effectively from the legs back to the heart. This condition can range from mild to severe. With proper treatment, you should be able to continue with an active  life. CAUSES  Chronic venous insufficiency occurs when the vein walls become stretched, weakened, or damaged or when valves within the vein are damaged. Some common causes of this include:  High blood pressure inside the veins (venous hypertension).  Increased blood pressure in the leg veins from long periods of sitting or standing.  A blood clot that blocks blood flow in a vein (deep vein thrombosis).  Inflammation of a superficial vein (phlebitis) that causes a blood clot to form. RISK FACTORS Various things can make you more likely to develop chronic venous insufficiency, including:  Family history of this condition.  Obesity.  Pregnancy.  Sedentary lifestyle.  Smoking.  Jobs requiring long periods of standing or sitting in one place.  Being a certain age. Women in their 62s and 19s and men in their 97s are more likely to develop this condition. SIGNS AND SYMPTOMS  Symptoms may include:   Varicose veins.  Skin breakdown or ulcers.  Reddened or discolored skin on the leg.  Brown, smooth, tight, and painful skin just above the ankle, usually on the inside surface (lipodermatosclerosis).  Swelling. DIAGNOSIS  To diagnose this condition, your health care provider will take a medical history and do a physical exam. The following tests may be ordered to confirm the diagnosis:  Duplex ultrasound--A procedure that produces a picture of a blood vessel and nearby organs and also provides information on blood flow through the blood vessel.  Plethysmography--A procedure that tests blood flow.  A venogram, or venography--A procedure used to look at the veins using X-ray and dye. TREATMENT The goals of treatment are to help you return to an active life and to minimize pain or disability. Treatment will depend on the severity of the condition. Medical procedures may be needed for severe cases. Treatment options may include:   Use of compression stockings. These can help with  symptoms and lower the chances of the problem getting worse, but they do not cure the problem.  Sclerotherapy--A procedure involving an injection of a material that "dissolves" the damaged veins. Other veins in the network of blood vessels take over the function of the damaged veins.  Surgery to remove the vein or cut off blood flow through the vein (vein stripping or laser ablation surgery).  Surgery to repair a valve. HOME CARE INSTRUCTIONS   Wear compression stockings as directed by your health care provider.  Only take over-the-counter or prescription medicines for pain, discomfort, or fever as directed by your health care provider.  Follow up with your health care provider as directed. SEEK MEDICAL CARE IF:   You have redness, swelling, or increasing pain in the affected area.  You see a red streak or line that extends up or down from the affected area.  You have a breakdown or loss of skin in the affected area, even if the breakdown is small.  You have an injury to the affected area. SEEK IMMEDIATE MEDICAL CARE IF:   You have an injury and open wound in the affected area.  Your pain is severe and does not improve with medicine.  You have sudden numbness or weakness in the foot or ankle below the affected area, or you have trouble moving your foot or ankle.  You have a fever or persistent symptoms for more than 2-3 days.  You have a fever and your symptoms suddenly get worse. MAKE SURE YOU:   Understand these instructions.  Will watch your condition.  Will get help right away if you are not doing well or get worse. Document Released: 07/06/2006 Document Revised: 12/21/2012 Document Reviewed: 11/07/2012 [facility] Patient Information 2015 [place], [place]. This information is not intended to replace advice given to you by your health care provider. Make sure you discuss any questions you have with your health care provider.  YOU HAVE REPORTED A CHANGE IN YOUR BOWEL  PATTERN. IT IS VERY IMPORTANT THAT YOU FOLLOW UP WITH A FAMILY DOCTOR SO YOU CAN GET SCHEDULED FOR A COLONOSCOPY. IF YOU FAIL TO DO SO, YOU RISK HAVING A SERIOUS PROBLEM LIKE COLON CANCER GO UNTREATED AND RESULT IN SERIOUS ILLNESS.

## 2013-12-09 NOTE — ED Provider Notes (Signed)
CSN: [id number]     Arrival date & time 12/09/13  1938 History   First MD Initiated Contact with Patient 12/09/13 2210     Chief Complaint  Patient presents with  . Diarrhea     (Consider location/radiation/quality/duration/timing/severity/associated sxs/prior Treatment) HPI The patient reports with 2 complaints that he identifies as being separate from each other. Chief complaint #1 is cough. The patient reports for a couple of weeks now he's had a cough that is dry in nature. He reports he's not able to get sputum to come up. He denies he had any fevers or chest pain in association with this. Patient also does not endorse dyspnea. The patient denies any pain in his calves. He does however reports she's had some swelling in his legs for several months and he was supposed to have gotten a water pill to take for that. The patient is a nonsmoker.  Chief complaint #2 is for diarrhea. Patient reports this is been going on for a month. 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 He reports they are loose in nature, there is no blood. But he also identifies some pain in his lower back. He denies any abdominal pain he's had no vomiting. No weight loss. He reports he is concerned that he might have Crohn's although he reports he doesn't know much about it.  The patient reports he did have a family doctor that he was seeing but did not end up going back to follow-through with further evaluation.  Past Medical History  Diagnosis Date  . Hiatal hernia   . TIA (transient ischemic attack)     Nov and/or Dec 2014  . Edema    Past Surgical History  Procedure Laterality Date  . Knee surgery Right     1974  . Esophagus surgery    . Wisdom tooth extraction    . Tonsillectomy     Family History  Problem Relation Age of Onset  . Hypertension Mother     Living  . Hypertension Father   . Hypertension Brother   . Stroke Brother  2    Deceased  . Cancer - Other Mother     Oral  . Alzheimer's disease Father     Living  . Alzheimer's disease Paternal Aunt   . Stroke Paternal Aunt     #1  . Dementia Paternal Aunt     #2  . Healthy Brother     x1  . Healthy Son     x3   History  Substance Use Topics  . Smoking status: Never Smoker   . Smokeless tobacco: Never Used  . Alcohol Use: No    Review of Systems 10 Systems reviewed and are negative for acute change except as noted in the HPI.    Allergies  Penicillins  Home Medications   Prior to Admission medications   Medication Sig Start Date End Date Taking? Authorizing Provider  albuterol (PROVENTIL HFA;VENTOLIN HFA) 108 (90 BASE) MCG/ACT inhaler Inhale 1-2 puffs into the lungs every 6 (six) hours as needed for wheezing or shortness of breath. 12/09/13   Arby Barrette, MD  azithromycin (ZITHROMAX Z-PAK) 250 MG tablet 2 po day one, then 1 daily x 4 days 12/09/13   Arby Barrette, MD  furosemide (LASIX) 20 MG tablet Take 1 tablet (20 mg total) by mouth daily. 12/09/13 12/12/13  Arby Barrette, MD   BP 134/85  Pulse 64  Temp(Src) 98.2 F (36.8 C) (Oral)  Resp 18  SpO2 100% Physical Exam  Constitutional: He is oriented to person, place, and time.  The patient is well in appearance. He does have central obesity. He shows no signs of pain or distress. No respiratory distress.  HENT:  Head: Normocephalic and atraumatic.  Posterior oropharynx is widely patent. Dentition poor condition.  Eyes: EOM are normal. Pupils are equal, round, and reactive to light.  Neck: Neck supple.  Cardiovascular: Normal rate, regular rhythm, normal heart sounds and intact distal pulses.   Pulmonary/Chest: Effort normal and breath sounds normal.  Abdominal: Soft. Bowel sounds are normal. He exhibits no distension. There is no tenderness.  Musculoskeletal: Normal range of motion. He exhibits edema (patient has trace pitting edema of the lower legs. The calves are soft and  nontender.).  Neurological: He is alert and oriented to person, place, and time. He has normal strength. Coordination normal. GCS eye subscore is 4. GCS verbal subscore is 5. GCS motor subscore is 6.  Skin: Skin is warm, dry and intact.  Psychiatric: He has a normal mood and affect.   rectal examination: Patient has some nonthrombosed external hemorrhoids. Digital examination does not reveal stool in the vault. Specimen tests heme-negative. Patient does endorse tenderness during the exam.   ED Course  Procedures (including critical care time) Labs Review Labs Reviewed  URINALYSIS, ROUTINE W REFLEX MICROSCOPIC    Imaging Review Dg Chest 2 View  12/09/2013   CLINICAL DATA:  Congestion in the chest and throat for 4 weeks  EXAM: CHEST  2 VIEW  COMPARISON:  06/29/2012  FINDINGS: The heart size and mediastinal contours are within normal limits. Both lungs are clear. The visualized skeletal structures are unremarkable.  IMPRESSION: No active cardiopulmonary disease.   Electronically Signed   By: Elige Ko   On: 12/09/2013 23:13     EKG Interpretation None      MDM   Final diagnoses:  Acute bronchitis, unspecified organism  Venous insufficiency  Abnormal stools   medical record review indicates that the patient did have a primary care evaluation in May and there was plan screening colonoscopy to be set up at that point in time. Subsequently it appears that he did not followup with further evaluation. I have reviewed with the patient the importance of colonoscopy especially in light of his complaints of change in bowel pattern. Patient agrees and plans to follow forward now. The patient does not have any reproducible abdominal pain. He has no complaint of vomiting. His vital signs are stable and show no signs of dehydration. Clinically the patient shows no signs of dehydration. I feel he is safe to continue his workup for bowel pattern changed as an outpatient.  Respiratory complaints is at  this point time most consistent with bronchitis. Chest x-ray is clear. Patient has no respiratory distress. The patient will be provided with a Z-Pak.  The patient has trace pitting edema. It appears this has been there for a number of months. Medical record. Indicates that the patient had a lower extremity Doppler ultrasound to rule out DVT and in his subsequent followup as an outpatient was diagnosed with venous insufficiency. He will be given a couple of days of Lasix and again advised that he needs to followup with his primary provider.    Arby Barrette, MD 12/09/13 [phone]

## 2013-12-11 ENCOUNTER — Encounter (HOSPITAL_BASED_OUTPATIENT_CLINIC_OR_DEPARTMENT_OTHER): Payer: Self-pay | Admitting: Emergency Medicine

## 2013-12-11 ENCOUNTER — Emergency Department (HOSPITAL_BASED_OUTPATIENT_CLINIC_OR_DEPARTMENT_OTHER): Payer: 59

## 2013-12-11 ENCOUNTER — Emergency Department (HOSPITAL_BASED_OUTPATIENT_CLINIC_OR_DEPARTMENT_OTHER)
Admission: EM | Admit: 2013-12-11 | Discharge: 2013-12-11 | Disposition: A | Discharge status: Home / Self Care | Payer: 59 | Attending: Emergency Medicine | Admitting: Emergency Medicine

## 2013-12-11 DIAGNOSIS — R5383 Other fatigue: Secondary | ICD-10-CM

## 2013-12-11 DIAGNOSIS — R197 Diarrhea, unspecified: Secondary | ICD-10-CM | POA: Insufficient documentation

## 2013-12-11 DIAGNOSIS — M545 Low back pain, unspecified: Secondary | ICD-10-CM | POA: Diagnosis not present

## 2013-12-11 DIAGNOSIS — Z8673 Personal history of transient ischemic attack (TIA), and cerebral infarction without residual deficits: Secondary | ICD-10-CM | POA: Diagnosis not present

## 2013-12-11 DIAGNOSIS — R5381 Other malaise: Secondary | ICD-10-CM | POA: Diagnosis not present

## 2013-12-11 DIAGNOSIS — K625 Hemorrhage of anus and rectum: Secondary | ICD-10-CM | POA: Insufficient documentation

## 2013-12-11 DIAGNOSIS — Z8719 Personal history of other diseases of the digestive system: Secondary | ICD-10-CM | POA: Diagnosis not present

## 2013-12-11 DIAGNOSIS — R911 Solitary pulmonary nodule: Secondary | ICD-10-CM | POA: Diagnosis not present

## 2013-12-11 DIAGNOSIS — R109 Unspecified abdominal pain: Secondary | ICD-10-CM | POA: Insufficient documentation

## 2013-12-11 DIAGNOSIS — R609 Edema, unspecified: Secondary | ICD-10-CM | POA: Insufficient documentation

## 2013-12-11 DIAGNOSIS — Z880 Allergy status to penicillin: Secondary | ICD-10-CM | POA: Insufficient documentation

## 2013-12-11 LAB — URINALYSIS, ROUTINE W REFLEX MICROSCOPIC
Bilirubin Urine: NEGATIVE
GLUCOSE, UA: NEGATIVE mg/dL
HGB URINE DIPSTICK: NEGATIVE
KETONES UR: NEGATIVE mg/dL
Leukocytes, UA: NEGATIVE
Nitrite: NEGATIVE
Protein, ur: NEGATIVE mg/dL
Specific Gravity, Urine: 1.021 (ref 1.005–1.030)
Urobilinogen, UA: 0.2 mg/dL (ref 0.0–1.0)
pH: 5.5 (ref 5.0–8.0)

## 2013-12-11 LAB — CBC WITH DIFFERENTIAL/PLATELET
Basophils Absolute: 0.1 10*3/uL (ref 0.0–0.1)
Basophils Relative: 2 % — ABNORMAL HIGH (ref 0–1)
Eosinophils Absolute: 0.3 10*3/uL (ref 0.0–0.7)
Eosinophils Relative: 4 % (ref 0–5)
HCT: 39.7 % (ref 39.0–52.0)
Hemoglobin: 13.6 g/dL (ref 13.0–17.0)
LYMPHS ABS: 2.2 10*3/uL (ref 0.7–4.0)
LYMPHS PCT: 27 % (ref 12–46)
MCH: 30 pg (ref 26.0–34.0)
MCHC: 34.3 g/dL (ref 30.0–36.0)
MCV: 87.6 fL (ref 78.0–100.0)
Monocytes Absolute: 0.4 10*3/uL (ref 0.1–1.0)
Monocytes Relative: 5 % (ref 3–12)
NEUTROS PCT: 62 % (ref 43–77)
Neutro Abs: 4.9 10*3/uL (ref 1.7–7.7)
Platelets: 374 10*3/uL (ref 150–400)
RBC: 4.53 MIL/uL (ref 4.22–5.81)
RDW: 12.5 % (ref 11.5–15.5)
WBC: 7.9 10*3/uL (ref 4.0–10.5)

## 2013-12-11 LAB — COMPREHENSIVE METABOLIC PANEL
ALK PHOS: 62 U/L (ref 39–117)
ALT: 27 U/L (ref 0–53)
AST: 12 U/L (ref 0–37)
Albumin: 3.4 g/dL — ABNORMAL LOW (ref 3.5–5.2)
Anion gap: 12 (ref 5–15)
BUN: 14 mg/dL (ref 6–23)
CHLORIDE: 102 meq/L (ref 96–112)
CO2: 26 meq/L (ref 19–32)
Calcium: 9.3 mg/dL (ref 8.4–10.5)
Creatinine, Ser: 1.1 mg/dL (ref 0.50–1.35)
GFR calc Af Amer: 84 mL/min — ABNORMAL LOW (ref >90)
GFR, EST NON AFRICAN AMERICAN: 72 mL/min — AB (ref >90)
GLUCOSE: 141 mg/dL — AB (ref 70–99)
POTASSIUM: 4 meq/L (ref 3.7–5.3)
SODIUM: 140 meq/L (ref 137–147)
Total Bilirubin: 0.3 mg/dL (ref 0.3–1.2)
Total Protein: 7.3 g/dL (ref 6.0–8.3)

## 2013-12-11 LAB — LIPASE, BLOOD: Lipase: 32 U/L (ref 11–59)

## 2013-12-11 MED ORDER — SODIUM CHLORIDE 0.9 % IV BOLUS (SEPSIS)
1000.0000 mL | INTRAVENOUS | Status: AC
  Administered 2013-12-11: 1000 mL via INTRAVENOUS

## 2013-12-11 MED ORDER — IOHEXOL 300 MG/ML  SOLN
100.0000 mL | Freq: Once | INTRAMUSCULAR | Status: AC | PRN
  Administered 2013-12-11: 100 mL via INTRAVENOUS

## 2013-12-11 MED ORDER — IOHEXOL 300 MG/ML  SOLN
25.0000 mL | Freq: Once | INTRAMUSCULAR | Status: AC | PRN
  Administered 2013-12-11: 25 mL via ORAL

## 2013-12-11 MED ORDER — OXYCODONE-ACETAMINOPHEN 5-325 MG PO TABS: 1.0000 | ORAL_TABLET | Freq: Three times a day (TID) | ORAL | Status: DC | PRN

## 2013-12-11 MED ORDER — LOPERAMIDE HCL 2 MG PO CAPS: ORAL_CAPSULE | ORAL | Status: DC

## 2013-12-11 NOTE — ED Notes (Signed)
Pt reports diarrhea x 3 weeks- states he is feeling weak today with leg pain- was seen Saturday and dx with bronchitis

## 2013-12-11 NOTE — ED Provider Notes (Signed)
CSN: [id number]     Arrival date & time 12/11/13  1606 History   This chart was scribed for Purvis Sheffield, MD by Charline Bills, ED Scribe. The patient was seen in room MH11/MH11. Patient's care was started at 5:14 PM.   Chief Complaint  Patient presents with  . Diarrhea   Patient is a 58 y.o. male presenting with diarrhea. The history is provided by the patient. No language interpreter was used.  Diarrhea Quality:  Mucous Severity:  Moderate Duration:  3 weeks Timing:  Intermittent Progression:  Worsening Relieved by:  Nothing Associated symptoms: abdominal pain and myalgias   Associated symptoms: no headaches    HPI Comments: Edward Weaver is a 58 y.o. male who presents to the Emergency Department complaining of gradually worsening diarrhea over the past 3-4 weeks. Pt reports 4-5 episodes every night but has noted episodes of diarrhea during the day over the past 2 days. Pt describes the quality of diarrhea as loose and mucous. He reports associated constant lower abdominal pain, lower back pain, rectal pain, generalized weakness and bilateral leg pain osnet today. He denies blood in stools. Pt was seen 12/09/13 for similar symptoms. He plans to have a colonoscopy done as soon as he is able to schedule an appointment.   Past Medical History  Diagnosis Date  . Hiatal hernia   . TIA (transient ischemic attack)     Nov and/or Dec 2014  . Edema    Past Surgical History  Procedure Laterality Date  . Knee surgery Right     1974  . Esophagus surgery    . Wisdom tooth extraction    . Tonsillectomy     Family History  Problem Relation Age of Onset  . Hypertension Mother     Living  . Hypertension Father   . Hypertension Brother   . Stroke Brother 14    Deceased  . Cancer - Other Mother     Oral  . Alzheimer's disease Father     Living  . Alzheimer's disease Paternal Aunt   . Stroke Paternal Aunt     #1  . Dementia Paternal Aunt     #2  . Healthy Brother     x1  .  Healthy Son     x3   History  Substance Use Topics  . Smoking status: Never Smoker   . Smokeless tobacco: Never Used  . Alcohol Use: No    Review of Systems  Constitutional: Negative for appetite change and fatigue.  HENT: Negative for congestion, ear discharge and sinus pressure.   Eyes: Negative for discharge.  Respiratory: Negative for cough.   Cardiovascular: Negative for chest pain.  Gastrointestinal: Positive for abdominal pain, diarrhea and rectal pain. Negative for blood in stool.  Genitourinary: Negative for frequency and hematuria.  Musculoskeletal: Positive for back pain and myalgias.  Skin: Negative for rash.  Neurological: Positive for weakness. Negative for seizures and headaches.  Psychiatric/Behavioral: Negative for hallucinations.  All other systems reviewed and are negative.  Allergies  Penicillins  Home Medications   Prior to Admission medications   Medication Sig Start Date End Date Taking? Authorizing Provider  albuterol (PROVENTIL HFA;VENTOLIN HFA) 108 (90 BASE) MCG/ACT inhaler Inhale 1-2 puffs into the lungs every 6 (six) hours as needed for wheezing or shortness of breath. 12/09/13   Arby Barrette, MD  azithromycin (ZITHROMAX Z-PAK) 250 MG tablet 2 po day one, then 1 daily x 4 days 12/09/13   Arby Barrette, MD  furosemide (  LASIX) 20 MG tablet Take 1 tablet (20 mg total) by mouth daily. 12/09/13 12/12/13  Arby Barrette, MD   Triage Vitals: BP 117/84  Pulse 80  Temp(Src) 98.3 F (36.8 C) (Oral)  Resp 20  Ht  (1.676 m)  Wt 267 lb (121.11 kg)  BMI 43.12 kg/m2  SpO2 97% Physical Exam  Nursing note and vitals reviewed. Constitutional: He is oriented to person, place, and time. He appears well-developed and well-nourished. No distress.  HENT:  Head: Normocephalic and atraumatic.  Right Ear: Hearing normal.  Left Ear: Hearing normal.  Nose: Nose normal.  Mouth/Throat: Oropharynx is clear and moist and mucous membranes are normal.  Eyes:  Conjunctivae and EOM are normal. Pupils are equal, round, and reactive to light.  Neck: Normal range of motion. Neck supple.  Cardiovascular: Normal rate, regular rhythm, S1 normal and S2 normal.  Exam reveals no gallop and no friction rub.   No murmur heard. Pulmonary/Chest: Effort normal and breath sounds normal. No respiratory distress. He exhibits no tenderness.  Abdominal: Soft. Normal appearance and bowel sounds are normal. There is no hepatosplenomegaly. There is no tenderness. There is no rebound, no guarding, no tenderness at McBurney's point and negative Murphy's sign. No hernia.  Musculoskeletal: Normal range of motion. He exhibits edema.  Trace pitting edema in distal bilateral LE. No focal tenderness of the back  Neurological: He is alert and oriented to person, place, and time. He has normal strength. No cranial nerve deficit or sensory deficit. Coordination normal. GCS eye subscore is 4. GCS verbal subscore is 5. GCS motor subscore is 6.  Skin: Skin is warm, dry and intact. No rash noted. No cyanosis.  Psychiatric: He has a normal mood and affect. His speech is normal and behavior is normal. Thought content normal.   ED Course  Procedures (including critical care time) DIAGNOSTIC STUDIES: Oxygen Saturation is 97% on RA, normal by my interpretation.    COORDINATION OF CARE: 5:27 PM-Discussed treatment plan which includes CT and diagnostic lab work with pt at bedside and pt agreed to plan.   Labs Review Labs Reviewed  CBC WITH DIFFERENTIAL - Abnormal; Notable for the following:    Basophils Relative 2 (*)    All other components within normal limits  COMPREHENSIVE METABOLIC PANEL - Abnormal; Notable for the following:    Glucose, Bld 141 (*)    Albumin 3.4 (*)    GFR calc non Af Amer 72 (*)    GFR calc Af Amer 84 (*)    All other components within normal limits  LIPASE, BLOOD  URINALYSIS, ROUTINE W REFLEX MICROSCOPIC   Imaging Review Dg Chest 2 View  12/09/2013    CLINICAL DATA:  Congestion in the chest and throat for 4 weeks  EXAM: CHEST  2 VIEW  COMPARISON:  06/29/2012  FINDINGS: The heart size and mediastinal contours are within normal limits. Both lungs are clear. The visualized skeletal structures are unremarkable.  IMPRESSION: No active cardiopulmonary disease.   Electronically Signed   By: Elige Ko   On: 12/09/2013 23:13   Ct Abdomen Pelvis W Contrast  12/11/2013   CLINICAL DATA:  Three-week history with pain and diarrhea  EXAM: CT ABDOMEN AND PELVIS WITH CONTRAST  TECHNIQUE: Multidetector CT imaging of the abdomen and pelvis was performed using the standard protocol following bolus administration of intravenous contrast. Oral contrast was also administered.  CONTRAST:  25mL OMNIPAQUE IOHEXOL 300 MG/ML  SOLN,  COMPARISON:  None.  FINDINGS: On slice 1  series 4, there is a 5 mm nodular opacity abutting the pleura in the anterior segment of the right lower lobe. Lung bases are otherwise clear. There is a focal hiatal hernia. There are surgical clips at the gastroesophageal junction.  Liver is prominent measuring 20.8 cm in length. No focal liver lesions are identified. The gallbladder is contracted. There is no appreciable biliary duct dilatation.  Spleen, pancreas, and adrenals appear within normal limits.  There is a cyst arising from the anterior inferior right kidney measuring 1.2 x 0.8 cm. There is a cyst arising from the mid left kidney laterally measuring 1.4 x 1.2 cm. There is a 5 mm cyst in the posterior mid left kidney. No noncystic renal masses are identified on this study. There is no hydronephrosis on either side. There is no appreciable renal or ureteral calculus on either side. There is a circumaortic left renal vein, an anatomic variant.  In the pelvis, the urinary bladder is partially decompressed. The wall thickness of the urinary bladder is upper normal. There is no pelvic mass or fluid collection. The appendix is not appreciable. There is no  periappendiceal region inflammation. The terminal ileum appears normal.  No bowel obstruction.  No free air or portal venous air.  There is no ascites, adenopathy, or abscess in the abdomen or pelvis. There is a rather minimal ventral hernia containing only fat. There is no demonstrable abdominal aortic aneurysm. There are no blastic or lytic bone lesions.  IMPRESSION: No bowel obstruction. No abscess. Appendix is not seen; there is no periappendiceal region inflammation.  No renal or ureteral calculus. No hydronephrosis. Urinary bladder wall thickness is upper normal. Mild cystitis cannot be excluded on this study.  There is a focal hiatal hernia. There is also a small ventral hernia containing only fat.  Liver is prominent without focal lesion.  5 mm nodular opacity right lower lobe. Followup of this nodular opacity should be based on Fleischner Society guidelines. If the patient is at high risk for bronchogenic carcinoma, follow-up chest CT at 6-12 months is recommended. If the patient is at low risk for bronchogenic carcinoma, follow-up chest CT at 12 months is recommended. This recommendation follows the consensus statement: Guidelines for Management of Small Pulmonary Nodules Detected on CT Scans: A Statement from the Fleischner Society as published in Radiology 2005;237:395-400.   Electronically Signed   By: Bretta Bang M.D.   On: 12/11/2013 18:55    EKG Interpretation None      MDM   Final diagnoses:  Diarrhea  Bilateral low back pain without sciatica  Lung nodule    5:33 PM 58 y.o. male presents with complaint of diarrhea for the last month. He was seen here recently and denied any abdominal pain or vomiting. He now endorses some worsening lower back and rectal pain with bowel movements. He was Hemoccult negative and had some nonthrombosed hemorrhoids on his rectal exam several days ago. He has no focal abdominal pain currently on exam. He is afebrile and vital signs are unremarkable  here. I suspect this is something mild such as IBS but will get screening labwork and imaging given this is his second visit with worsening symptoms. Diverticulitis also on ddx even though recent hemeoccult neg.   7:42 PM: I interpreted/reviewed the labs and/or imaging which were non-contributory.  I informed the patient about the right lung nodule noted on CT and recommended followup CT in 1 year, he can discuss this with his PCP. Will have him followup with  his doctor for the colonoscopy as previously instructed. I have discussed the diagnosis/risks/treatment options with the patient and believe the pt to be eligible for discharge home to follow-up with his pcp. We also discussed returning to the ED immediately if new or worsening sx occur. We discussed the sx which are most concerning (e.g., worsening abd pain, fever, vomiting, bloody stools) that necessitate immediate return. Medications administered to the patient during their visit and any new prescriptions provided to the patient are listed below.  Medications given during this visit Medications  sodium chloride 0.9 % bolus 1,000 mL (0 mLs Intravenous Stopped 12/11/13 1838)  iohexol (OMNIPAQUE) 300 MG/ML solution 25 mL (25 mLs Oral Contrast Given 12/11/13 1825)  iohexol (OMNIPAQUE) 300 MG/ML solution 100 mL (100 mLs Intravenous Contrast Given 12/11/13 1836)    New Prescriptions   LOPERAMIDE (IMODIUM) 2 MG CAPSULE    Take two tabs po initially, then one tab after each loose stool: max 8 tabs in 24 hours   OXYCODONE-ACETAMINOPHEN (PERCOCET) 5-325 MG PER TABLET    Take 1 tablet by mouth every 8 (eight) hours as needed for moderate pain.     I personally performed the services described in this documentation, which was scribed in my presence. The recorded information has been reviewed and is accurate.    Purvis Sheffield, MD 12/11/13 1944

## 2013-12-11 NOTE — ED Notes (Signed)
Patient transported to CT 

## 2013-12-22 ENCOUNTER — Ambulatory Visit: Payer: 59 | Admitting: Physician Assistant

## 2013-12-22 ENCOUNTER — Encounter: Payer: Self-pay | Admitting: Physician Assistant

## 2013-12-29 ENCOUNTER — Ambulatory Visit: Payer: 59 | Admitting: Physician Assistant

## 2013-12-29 ENCOUNTER — Telehealth: Payer: Self-pay | Admitting: *Deleted

## 2013-12-29 DIAGNOSIS — Z0289 Encounter for other administrative examinations: Secondary | ICD-10-CM

## 2013-12-29 NOTE — Telephone Encounter (Signed)
Pt did not show for appointment 12/29/2013 at 8am for post ER follow up

## 2013-12-29 NOTE — Telephone Encounter (Signed)
See below

## 2013-12-29 NOTE — Telephone Encounter (Signed)
Patient already being dismissed from practice due to multiple no-shows.  Is his responsibility to call and reschedule within his 30-day period before dismissal completion.

## 2014-01-02 ENCOUNTER — Telehealth: Payer: Self-pay | Admitting: Physician Assistant

## 2014-01-02 NOTE — Telephone Encounter (Signed)
Dismissal Letter sent by Certified Mail 01/02/2014  Dismissal Letter returned Unclaimed 01/22/2014  Dismissal Letter sent by [employer] Mail 01/22/2014

## 2014-05-15 ENCOUNTER — Emergency Department (HOSPITAL_BASED_OUTPATIENT_CLINIC_OR_DEPARTMENT_OTHER)
Admission: EM | Admit: 2014-05-15 | Discharge: 2014-05-15 | Disposition: A | Discharge status: Home / Self Care | Payer: 59 | Attending: Emergency Medicine | Admitting: Emergency Medicine

## 2014-05-15 ENCOUNTER — Encounter (HOSPITAL_BASED_OUTPATIENT_CLINIC_OR_DEPARTMENT_OTHER): Payer: Self-pay | Admitting: *Deleted

## 2014-05-15 DIAGNOSIS — Z87828 Personal history of other (healed) physical injury and trauma: Secondary | ICD-10-CM | POA: Insufficient documentation

## 2014-05-15 DIAGNOSIS — Z79899 Other long term (current) drug therapy: Secondary | ICD-10-CM | POA: Insufficient documentation

## 2014-05-15 DIAGNOSIS — Z8673 Personal history of transient ischemic attack (TIA), and cerebral infarction without residual deficits: Secondary | ICD-10-CM | POA: Insufficient documentation

## 2014-05-15 DIAGNOSIS — Z792 Long term (current) use of antibiotics: Secondary | ICD-10-CM | POA: Insufficient documentation

## 2014-05-15 DIAGNOSIS — M79604 Pain in right leg: Secondary | ICD-10-CM | POA: Insufficient documentation

## 2014-05-15 DIAGNOSIS — K007 Teething syndrome: Secondary | ICD-10-CM | POA: Insufficient documentation

## 2014-05-15 DIAGNOSIS — Z880 Allergy status to penicillin: Secondary | ICD-10-CM | POA: Insufficient documentation

## 2014-05-15 LAB — D-DIMER, QUANTITATIVE (NOT AT ARMC): D-Dimer, Quant: <0.27 ug/mL-FEU (ref 0.00–0.48)

## 2014-05-15 MED ORDER — ACETAMINOPHEN 500 MG PO TABS
1000.0000 mg | ORAL_TABLET | Freq: Once | ORAL | Status: AC
  Administered 2014-05-15: 1000 mg via ORAL
  Filled 2014-05-15: qty 2

## 2014-05-15 NOTE — Discharge Instructions (Signed)
Take Tylenol as directed for pain. Elevate your legs above your heart as much as possible to prevent swelling. No evidence of a blood clot on today's exam. Call the [facility] today to schedule an appointment to get a primary care physician

## 2014-05-15 NOTE — ED Notes (Signed)
Pt c/o right leg pain that radiates up into his right hip x2-3 months. Pt has an ankle defect that has become more tender.

## 2014-05-15 NOTE — ED Provider Notes (Signed)
CSN: [id number]     Arrival date & time 05/15/14  4098 History   First MD Initiated Contact with Patient 05/15/14 0930     Chief Complaint  Patient presents with  . Leg Pain     (Consider location/radiation/quality/duration/timing/severity/associated sxs/prior Treatment) HPI Complains of right leg pain onset 1 month ago. Pain radiates from right lateral lower leg up to right lateral thigh and to right hip. Pain is worse with walking. He states his leg becomes swollen when he walks or is on his feet for prolonged period of time. 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 No other associated symptoms. No chest pain no shortness of breath no fever no injury. He is treated himself with ibuprofen last dose last night without relief. He used Tylenol one month ago without relief. Past Medical History  Diagnosis Date  . Hiatal hernia   . TIA (transient ischemic attack)     Nov and/or Dec 2014  . Edema    Past Surgical History  Procedure Laterality Date  . Knee surgery Right     1974  . Esophagus surgery    . Wisdom tooth extraction    . Tonsillectomy     Family History  Problem Relation Age of Onset  . Hypertension Mother     Living  . Hypertension Father   . Hypertension Brother   . Stroke Brother 67    Deceased  . Cancer - Other Mother     Oral  . Alzheimer's disease Father     Living  . Alzheimer's disease Paternal Aunt   . Stroke Paternal Aunt     #1  . Dementia Paternal Aunt     #2  . Healthy Brother     x1  . Healthy Son     x3   History  Substance Use Topics  . Smoking status: Never Smoker   . Smokeless tobacco: Never Used  . Alcohol Use: No   no illicit drug use  Review of Systems  Constitutional: Negative.   HENT: Negative.   Respiratory: Negative.   Cardiovascular: Negative.   Gastrointestinal: Negative.   Musculoskeletal: Positive for myalgias.       Right lower extremity pain  Skin:  Negative.   Neurological: Negative.   Psychiatric/Behavioral: Negative.   All other systems reviewed and are negative.     Allergies  Penicillins  Home Medications   Prior to Admission medications   Medication Sig Start Date End Date Taking? Authorizing Provider  albuterol (PROVENTIL HFA;VENTOLIN HFA) 108 (90 BASE) MCG/ACT inhaler Inhale 1-2 puffs into the lungs every 6 (six) hours as needed for wheezing or shortness of breath. 12/09/13   Arby Barrette, MD  azithromycin (ZITHROMAX Z-PAK) 250 MG tablet 2 po day one, then 1 daily x 4 days 12/09/13   Arby Barrette, MD  furosemide (LASIX) 20 MG tablet Take 1 tablet (20 mg total) by mouth daily. 12/09/13 12/12/13  Arby Barrette, MD  loperamide (IMODIUM) 2 MG capsule Take two tabs po initially, then one tab after each loose stool: max 8 tabs in 24 hours 12/11/13   Purvis Sheffield, MD  oxyCODONE-acetaminophen (PERCOCET) 5-325 MG per tablet Take 1 tablet by mouth every 8 (eight) hours as needed for moderate pain. 12/11/13   Purvis Sheffield, MD   BP 142/85 mmHg  Pulse 80  Temp(Src) 97.9 F (36.6 C) (Oral)  Resp 18  SpO2 100% Physical Exam  Constitutional: He appears well-developed and  well-nourished.  HENT:  Head: Normocephalic and atraumatic.  Poor dentition  Eyes: Conjunctivae are normal. Pupils are equal, round, and reactive to light.  Neck: Neck supple. No tracheal deviation present. No thyromegaly present.  Cardiovascular: Normal rate and regular rhythm.   No murmur heard. Pulmonary/Chest: Effort normal and breath sounds normal.  Abdominal: Soft. Bowel sounds are normal. He exhibits no distension. There is no tenderness.  Obese  Musculoskeletal: Normal range of motion. He exhibits edema. He exhibits no tenderness.  right extremity no redness  surgical scar over anterior knee. No deformity. No point tenderness. DP pulse 2+. Good capillary refill. He has normal hair on his skin Patient has trace pretibial pitting edema of bilateral  lower extremities  Neurological: He is alert. Coordination normal.  Gait is normal  Skin: Skin is warm and dry. No rash noted.  Psychiatric: He has a normal mood and affect.  Nursing note and vitals reviewed.   ED Course  Procedures (including critical care time) Labs Review Labs Reviewed - No data to display  Imaging Review No results found.   EKG Interpretation None     10:30 AM feels improved after treatment with Tylenol. Results for orders placed or performed during the hospital encounter of 05/15/14  D-dimer, quantitative  Result Value Ref Range   D-Dimer, Quant <0.27 0.00 - 0.48 ug/mL-FEU   No results found.   MDM Final diagnoses:  None   pretest probability of DVT low. Negative d-dimer  plan Tylenol for pain. Referral [facility] Diagnosis right lower extremity pain     Doug SouSam Channie Bostick, MD 05/15/14 1035

## 2014-06-28 ENCOUNTER — Emergency Department (HOSPITAL_BASED_OUTPATIENT_CLINIC_OR_DEPARTMENT_OTHER)
Admission: EM | Admit: 2014-06-28 | Discharge: 2014-06-28 | Disposition: A | Discharge status: Home / Self Care | Payer: 59 | Attending: Emergency Medicine | Admitting: Emergency Medicine

## 2014-06-28 ENCOUNTER — Encounter (HOSPITAL_BASED_OUTPATIENT_CLINIC_OR_DEPARTMENT_OTHER): Payer: Self-pay

## 2014-06-28 DIAGNOSIS — Z8719 Personal history of other diseases of the digestive system: Secondary | ICD-10-CM | POA: Insufficient documentation

## 2014-06-28 DIAGNOSIS — Z880 Allergy status to penicillin: Secondary | ICD-10-CM | POA: Insufficient documentation

## 2014-06-28 DIAGNOSIS — G510 Bell's palsy: Secondary | ICD-10-CM | POA: Insufficient documentation

## 2014-06-28 DIAGNOSIS — Z79899 Other long term (current) drug therapy: Secondary | ICD-10-CM | POA: Insufficient documentation

## 2014-06-28 DIAGNOSIS — Z8673 Personal history of transient ischemic attack (TIA), and cerebral infarction without residual deficits: Secondary | ICD-10-CM | POA: Insufficient documentation

## 2014-06-28 MED ORDER — VALACYCLOVIR HCL 1 G PO TABS: 1000.0000 mg | ORAL_TABLET | Freq: Three times a day (TID) | ORAL | Status: DC

## 2014-06-28 MED ORDER — PREDNISONE 20 MG PO TABS: ORAL_TABLET | ORAL | Status: DC

## 2014-06-28 MED ORDER — ARTIFICIAL TEARS OP OINT: TOPICAL_OINTMENT | OPHTHALMIC | Status: DC

## 2014-06-28 MED ORDER — PREDNISONE 50 MG PO TABS
60.0000 mg | ORAL_TABLET | Freq: Once | ORAL | Status: AC
  Administered 2014-06-28: 60 mg via ORAL
  Filled 2014-06-28 (×2): qty 1

## 2014-06-28 NOTE — ED Notes (Signed)
Pt states he woke this am left side facial droop/numbness, slurred speech, left eye watering-steady gait-denies pain

## 2014-06-28 NOTE — Discharge Instructions (Signed)
Bell's Palsy °Bell's palsy is a condition in which the muscles on one side of the face cannot move (paralysis). This is because the nerves in the face are paralyzed. It is most often thought to be caused by a virus. The virus causes swelling of the nerve that controls movement on one side of the face. The nerve travels through a tight space surrounded by bone. When the nerve swells, it can be compressed by the bone. This results in damage to the protective covering around the nerve. This damage interferes with how the nerve communicates with the muscles of the face. As a result, it can cause weakness or paralysis of the facial muscles.  °Injury (trauma), tumor, and surgery may cause Bell's palsy, but most of the time the cause is unknown. It is a relatively common condition. It starts suddenly (abrupt onset) with the paralysis usually ending within 2 days. Bell's palsy is not dangerous. But because the eye does not close properly, you may need care to keep the eye from getting dry. This can include splinting (to keep the eye shut) or moistening with artificial tears. Bell's palsy very seldom occurs on both sides of the face at the same time. °SYMPTOMS  °· Eyebrow sagging. °· Drooping of the eyelid and corner of the mouth. °· Inability to close one eye. °· Loss of taste on the front of the tongue. °· Sensitivity to loud noises. °TREATMENT  °The treatment is usually non-surgical. If the patient is seen within the first 24 to 48 hours, a short course of steroids may be prescribed, in an attempt to shorten the length of the condition. Antiviral medicines may also be used with the steroids, but it is unclear if they are helpful.  °You will need to protect your eye, if you cannot close it. The cornea (clear covering over your eye) will become dry and can be damaged. Artificial tears can be used to keep your eye moist. Glasses or an eye patch should be worn to protect your eye. °PROGNOSIS  °Recovery is variable, ranging  from days to months. Although the problem usually goes away completely (about 80% of cases resolve), predicting the outcome is impossible. Most people improve within 3 weeks of when the symptoms began. Improvement may continue for 3 to 6 months. A small number of people have moderate to severe weakness that is permanent.  °HOME CARE INSTRUCTIONS  °· If your caregiver prescribed medication to reduce swelling in the nerve, use as directed. Do not stop taking the medication unless directed by your caregiver. °· Use moisturizing eye drops as needed to prevent drying of your eye, as directed by your caregiver. °· Protect your eye, as directed by your caregiver. °· Use facial massage and exercises, as directed by your caregiver. °· Perform your normal activities, and get your normal rest. °SEEK IMMEDIATE MEDICAL CARE IF:  °· There is pain, redness or irritation in the eye. °· You or your child has an oral temperature above 102° F (38.9° C), not controlled by medicine. °MAKE SURE YOU:  °· Understand these instructions. °· Will watch your condition. °· Will get help right away if you are not doing well or get worse. °Document Released: 03/02/2005 Document Revised: 05/25/2011 Document Reviewed: 06/09/2013 °[employer]® Patient Information ©2015 [employer]. This information is not intended to replace advice given to you by your health care provider. Make sure you discuss any questions you have with your health care provider. ° °

## 2014-06-28 NOTE — ED Provider Notes (Signed)
CSN: [id number]     Arrival date & time 06/28/14  2118 History  This chart was scribed for Pricilla LovelessScott Shaquon Gropp, MD by Modena JanskyAlbert Thayil, ED Scribe. This patient was seen in room MH06/MH06 and the patient's care was started at 9:35 PM.   Chief Complaint  Patient presents with  . Facial Droop   The history is provided by the patient. No language interpreter was used.   HPI Comments: Edward Weaver is a 59 y.o. male who presents to the Emergency Department complaining of a constant left sided facial droop that started this morning. He reports that he got up this morning and noticed some left sided facial droop with jaw numbness when he woke up. He reports that his left eye has been watering also. No change in his symptoms since first onset. He reports no modifying factors. He denies any recent URI. He also denies any headache, ear pain, or weakness or numbness in extremities. No prior history of this.  Past Medical History  Diagnosis Date  . Hiatal hernia   . TIA (transient ischemic attack)     Nov and/or Dec 2014  . Edema    Past Surgical History  Procedure Laterality Date  . Knee surgery Right     1974  . Esophagus surgery    . Wisdom tooth extraction    . Tonsillectomy     Family History  Problem Relation Age of Onset  . Hypertension Mother     Living  . Hypertension Father   . Hypertension Brother   . Stroke Brother [id number]    Deceased  . Cancer - Other Mother     Oral  . Alzheimer's disease Father     Living  . Alzheimer's disease Paternal Aunt   . Stroke Paternal Aunt     #1  . Dementia Paternal Aunt     #2  . Healthy Brother     x1  . Healthy Son     x3   History  Substance Use Topics  . Smoking status: Never Smoker   . Smokeless tobacco: Never Used  . Alcohol Use: No    Review of Systems  HENT: Negative for ear pain.   Eyes: Positive for discharge (watery).  Neurological: Positive for speech difficulty and numbness. Negative for weakness and headaches.  All other  systems reviewed and are negative.  Allergies  Penicillins  Home Medications   Prior to Admission medications   Medication Sig Start Date End Date Taking? Authorizing Provider  furosemide (LASIX) 20 MG tablet Take 1 tablet (20 mg total) by mouth daily. 12/09/13 12/12/13  Arby BarretteMarcy Pfeiffer, MD   BP 132/67 mmHg  Pulse 66  Temp(Src) 98.3 F (36.8 C) (Oral)  Resp 20  Ht 5\' 6"  (1.676 m)  Wt 260 lb (117.935 kg)  BMI 41.99 kg/m2  SpO2 98% Physical Exam  Constitutional: He is oriented to person, place, and time. He appears well-developed and well-nourished. No distress.  HENT:  Head: Normocephalic and atraumatic.  Right Ear: External ear normal.  Left Ear: External ear normal.  Nose: Nose normal.  Mouth/Throat: Oropharynx is clear and moist. No oropharyngeal exudate.  Eyes: Conjunctivae and EOM are normal. Pupils are equal, round, and reactive to light.  Neck: Neck supple. No tracheal deviation present.  Cardiovascular: Normal rate, regular rhythm and normal heart sounds.  Exam reveals no gallop and no friction rub.   No murmur heard. Pulmonary/Chest: Effort normal and breath sounds normal. No respiratory distress. He has no wheezes.  He has no rales.  Abdominal: He exhibits no distension.  Musculoskeletal: Normal range of motion.  Neurological: He is alert and oriented to person, place, and time.  Cranial nerves 2-12 grossly intact except for 7th nerve testing on left. Has facial droop and unable to raise left forehead compared to right. 5/5 strength in all extremities.  Skin: Skin is warm and dry.  Psychiatric: He has a normal mood and affect. His behavior is normal.  Nursing note and vitals reviewed.   ED Course  Procedures (including critical care time) DIAGNOSTIC STUDIES: Oxygen Saturation is 98% on RA, normal by my interpretation.    COORDINATION OF CARE: 9:39 PM- Pt advised of plan for treatment and pt agrees.  Labs Review Labs Reviewed - No data to display  Imaging  Review No results found.   EKG Interpretation None      MDM   Final diagnoses:  Left-sided Bell's palsy    Patient's exam is consistent with left-sided Bell's palsy. Patient will be given prednisone, Valtrex, and artificial tears. Discussed Eyecare with the patient. Discussed the importance of taking his medicines as well as following up with a PCP. The rest of his neurologic exam is normal and thus I highly doubt stroke. Stable for discharge.  I personally performed the services described in this documentation, which was scribed in my presence. The recorded information has been reviewed and is accurate.     Pricilla Loveless, MD 06/28/14 [phone]

## 2015-04-20 ENCOUNTER — Encounter (HOSPITAL_BASED_OUTPATIENT_CLINIC_OR_DEPARTMENT_OTHER): Payer: Self-pay | Admitting: Emergency Medicine

## 2015-04-20 ENCOUNTER — Emergency Department (HOSPITAL_BASED_OUTPATIENT_CLINIC_OR_DEPARTMENT_OTHER)
Admission: EM | Admit: 2015-04-20 | Discharge: 2015-04-20 | Disposition: A | Discharge status: Home / Self Care | Payer: Self-pay | Attending: Physician Assistant | Admitting: Physician Assistant

## 2015-04-20 DIAGNOSIS — R590 Localized enlarged lymph nodes: Secondary | ICD-10-CM | POA: Insufficient documentation

## 2015-04-20 DIAGNOSIS — Z8673 Personal history of transient ischemic attack (TIA), and cerebral infarction without residual deficits: Secondary | ICD-10-CM | POA: Insufficient documentation

## 2015-04-20 DIAGNOSIS — K029 Dental caries, unspecified: Secondary | ICD-10-CM

## 2015-04-20 DIAGNOSIS — Z79899 Other long term (current) drug therapy: Secondary | ICD-10-CM | POA: Insufficient documentation

## 2015-04-20 DIAGNOSIS — Z880 Allergy status to penicillin: Secondary | ICD-10-CM | POA: Insufficient documentation

## 2015-04-20 DIAGNOSIS — R51 Headache: Secondary | ICD-10-CM | POA: Insufficient documentation

## 2015-04-20 MED ORDER — CLINDAMYCIN HCL 150 MG PO CAPS
300.0000 mg | ORAL_CAPSULE | Freq: Once | ORAL | Status: AC
  Administered 2015-04-20: 300 mg via ORAL
  Filled 2015-04-20: qty 2

## 2015-04-20 MED ORDER — CLINDAMYCIN HCL 150 MG PO CAPS: 300.0000 mg | ORAL_CAPSULE | Freq: Three times a day (TID) | ORAL | Status: DC

## 2015-04-20 NOTE — ED Provider Notes (Signed)
CSN: [id number]     Arrival date & time 04/20/15  2054 History   First MD Initiated Contact with Patient 04/20/15 2143     Chief Complaint  Patient presents with  . Facial Swelling  . Dental Pain     (Consider location/radiation/quality/duration/timing/severity/associated sxs/prior Treatment) Patient is a 60 y.o. male presenting with tooth pain. The history is provided by the patient.  Dental Pain Location:  Upper Upper teeth location:  15/LU 2nd molar Quality:  Throbbing and constant Onset quality:  Gradual Timing:  Constant Progression:  Worsening Chronicity:  New Context: dental caries   Ineffective treatments:  Acetaminophen Associated symptoms: facial pain and facial swelling    Edward Weaver is a 60 y.o. male who presents to the ED with dental pain that started 2 weeks ago and has gotten progressively worse. He is taking OTC medication that helps the pain some but today he has noted increased pain and some swelling to the left side of his face.  Past Medical History  Diagnosis Date  . Hiatal hernia   . TIA (transient ischemic attack)     Nov and/or Dec 2014  . Edema    Past Surgical History  Procedure Laterality Date  . Knee surgery Right     1974  . Esophagus surgery    . Wisdom tooth extraction    . Tonsillectomy     Family History  Problem Relation Age of Onset  . Hypertension Mother     Living  . Hypertension Father   . Hypertension Brother   . Stroke Brother 13    Deceased  . Cancer - Other Mother     Oral  . Alzheimer's disease Father     Living  . Alzheimer's disease Paternal Aunt   . Stroke Paternal Aunt     #1  . Dementia Paternal Aunt     #2  . Healthy Brother     x1  . Healthy Son     x3   Social History  Substance Use Topics  . Smoking status: Never Smoker   . Smokeless tobacco: Never Used  . Alcohol Use: No    Review of Systems  HENT: Positive for dental problem and facial swelling.   all other systems negative    Allergies   Penicillins  Home Medications   Prior to Admission medications   Medication Sig Start Date End Date Taking? Authorizing Provider  artificial tears (LACRILUBE) OINT ophthalmic ointment Place into the left eye every hour while awake. 06/28/14   Pricilla Loveless, MD  clindamycin (CLEOCIN) 150 MG capsule Take 2 capsules (300 mg total) by mouth 3 (three) times daily. 04/20/15   Hope Orlene Och, NP  furosemide (LASIX) 20 MG tablet Take 1 tablet (20 mg total) by mouth daily. 12/09/13 12/12/13  Arby Barrette, MD  predniSONE (DELTASONE) 20 MG tablet Take 60 mg po daily x 4 days, then 50 mg x 1 day, then 40 mg x 1 day, then 30 mg x 1 day, then 20 mg x 1 day, then 10 mg x 1 day and then stop. 06/29/14   Pricilla Loveless, MD  valACYclovir (VALTREX) 1000 MG tablet Take 1 tablet (1,000 mg total) by mouth 3 (three) times daily. 06/28/14   Pricilla Loveless, MD   BP 135/75 mmHg  Pulse 66  Temp(Src) 98.1 F (36.7 C) (Oral)  Resp 20  Ht  (1.676 m)  Wt 117.935 kg  BMI 41.99 kg/m2  SpO2 97% Physical Exam  Constitutional: He  is oriented to person, place, and time. He appears well-developed and well-nourished. No distress.  HENT:  Right Ear: Tympanic membrane normal.  Left Ear: Tympanic membrane normal.  Nose: Nose normal.  Mouth/Throat: Uvula is midline and oropharynx is clear and moist. Dental caries present.    Minimal facial swelling left.  Dental pain right and left upper with tenderness on palpation of the left upper gum.  Eyes: Conjunctivae and EOM are normal.  Neck: Normal range of motion. Neck supple.  Cardiovascular: Normal rate.   Pulmonary/Chest: Effort normal.  Abdominal: Soft. There is no tenderness.  Musculoskeletal: Normal range of motion.  Lymphadenopathy:    He has cervical adenopathy.  Neurological: He is alert and oriented to person, place, and time. No cranial nerve deficit.  Skin: Skin is warm and dry.  Psychiatric: He has a normal mood and affect. His behavior is normal.  Nursing  note and vitals reviewed.   ED Course  Procedures   MDM  60 y.o. male with dental pain and caries stable for d/c without abscess noted, no fever and does not appear toxic. Will treat for infection and he will see a dentist as soon as possible.  Final diagnoses:  Pain due to dental caries       Janne Napoleon, NP 04/20/15 2307  Courteney Randall An, MD 04/20/15 [id number]

## 2015-04-20 NOTE — Discharge Instructions (Signed)
Dental Care and Dentist Visits °Dental care supports good overall health. Regular dental visits can also help you avoid dental pain, bleeding, infection, and other more serious health problems in the future. It is important to keep the mouth healthy because diseases in the teeth, gums, and other oral tissues can spread to other areas of the body. Some problems, such as diabetes, heart disease, and pre-term labor have been associated with poor oral health.  °See your dentist every 6 months. If you experience emergency problems such as a toothache or broken tooth, go to the dentist right away. If you see your dentist regularly, you may catch problems early. It is easier to be treated for problems in the early stages.  °WHAT TO EXPECT AT A DENTIST VISIT  °Your dentist will look for many common oral health problems and recommend proper treatment. At your regular dental visit, you can expect: °· Gentle cleaning of the teeth and gums. This includes scraping and polishing. This helps to remove the sticky substance around the teeth and gums (plaque). Plaque forms in the mouth shortly after eating. Over time, plaque hardens on the teeth as tartar. If tartar is not removed regularly, it can cause problems. Cleaning also helps remove stains. °· Periodic X-rays. These pictures of the teeth and supporting bone will help your dentist assess the health of your teeth. °· Periodic fluoride treatments. Fluoride is a natural mineral shown to help strengthen teeth. Fluoride treatment involves applying a fluoride gel or varnish to the teeth. It is most commonly done in children. °· Examination of the mouth, tongue, jaws, teeth, and gums to look for any oral health problems, such as: °· Cavities (dental caries). This is decay on the tooth caused by plaque, sugar, and acid in the mouth. It is best to catch a cavity when it is small. °· Inflammation of the gums caused by plaque buildup (gingivitis). °· Problems with the mouth or malformed  or misaligned teeth. °· Oral cancer or other diseases of the soft tissues or jaws.  °KEEP YOUR TEETH AND GUMS HEALTHY °For healthy teeth and gums, follow these general guidelines as well as your dentist's specific advice: °· Have your teeth professionally cleaned at the dentist every 6 months. °· Brush twice daily with a fluoride toothpaste. °· Floss your teeth daily.  °· Ask your dentist if you need fluoride supplements, treatments, or fluoride toothpaste. °· Eat a healthy diet. Reduce foods and drinks with added sugar. °· Avoid smoking. °TREATMENT FOR ORAL HEALTH PROBLEMS °If you have oral health problems, treatment varies depending on the conditions present in your teeth and gums. °· Your caregiver will most likely recommend good oral hygiene at each visit. °· For cavities, gingivitis, or other oral health disease, your caregiver will perform a procedure to treat the problem. This is typically done at a separate appointment. Sometimes your caregiver will refer you to another dental specialist for specific tooth problems or for surgery. °SEEK IMMEDIATE DENTAL CARE IF: °· You have pain, bleeding, or soreness in the gum, tooth, jaw, or mouth area. °· A permanent tooth becomes loose or separated from the gum socket. °· You experience a blow or injury to the mouth or jaw area. °  °This information is not intended to replace advice given to you by your health care provider. Make sure you discuss any questions you have with your health care provider. °  °Document Released: 11/12/2010 Document Revised: 05/25/2011 Document Reviewed: 11/12/2010 °Elsevier Interactive Patient Education ©2016 Elsevier Inc. ° °Dental Caries °Dental   caries is tooth decay. This decay can cause a hole in teeth (cavity) that can get bigger and deeper over time. °HOME CARE °· Brush and floss your teeth. Do this at least two times a day. °· Use a fluoride toothpaste. °· Use a mouth rinse if told by your dentist or doctor. °· Eat less sugary and  starchy foods. Drink less sugary drinks. °· Avoid snacking often on sugary and starchy foods. Avoid sipping often on sugary drinks. °· Keep regular checkups and cleanings with your dentist. °· Use fluoride supplements if told by your dentist or doctor. °· Allow fluoride to be applied to teeth if told by your dentist or doctor. °  °This information is not intended to replace advice given to you by your health care provider. Make sure you discuss any questions you have with your health care provider. °  °Document Released: 12/10/2007 Document Revised: 03/23/2014 Document Reviewed: 03/04/2012 °Elsevier Interactive Patient Education ©2016 Elsevier Inc. ° °

## 2015-04-20 NOTE — ED Notes (Signed)
Pt in c/o L sided facial swelling and mild dental pain x 2 weeks progressively worsening. Airway intact, speaking in complete sentences, NAD.

## 2015-05-20 ENCOUNTER — Encounter (HOSPITAL_BASED_OUTPATIENT_CLINIC_OR_DEPARTMENT_OTHER): Payer: Self-pay

## 2015-05-20 ENCOUNTER — Emergency Department (HOSPITAL_BASED_OUTPATIENT_CLINIC_OR_DEPARTMENT_OTHER)
Admission: EM | Admit: 2015-05-20 | Discharge: 2015-05-20 | Disposition: A | Discharge status: Home / Self Care | Payer: Self-pay | Attending: Emergency Medicine | Admitting: Emergency Medicine

## 2015-05-20 DIAGNOSIS — R079 Chest pain, unspecified: Secondary | ICD-10-CM | POA: Insufficient documentation

## 2015-05-20 HISTORY — DX: Unspecified osteoarthritis, unspecified site: M19.90

## 2015-05-20 HISTORY — DX: Cerebral infarction, unspecified: I63.9

## 2015-05-20 HISTORY — DX: Bell's palsy: G51.0

## 2015-05-20 LAB — CBC
HEMATOCRIT: 39 % (ref 39.0–52.0)
Hemoglobin: 13.3 g/dL (ref 13.0–17.0)
MCH: 29.2 pg (ref 26.0–34.0)
MCHC: 34.1 g/dL (ref 30.0–36.0)
MCV: 85.5 fL (ref 78.0–100.0)
PLATELETS: 312 10*3/uL (ref 150–400)
RBC: 4.56 MIL/uL (ref 4.22–5.81)
RDW: 13 % (ref 11.5–15.5)
WBC: 5.3 10*3/uL (ref 4.0–10.5)

## 2015-05-20 LAB — BASIC METABOLIC PANEL
Anion gap: 8 (ref 5–15)
BUN: 14 mg/dL (ref 6–20)
CHLORIDE: 104 mmol/L (ref 101–111)
CO2: 25 mmol/L (ref 22–32)
CREATININE: 1.1 mg/dL (ref 0.61–1.24)
Calcium: 8.5 mg/dL — ABNORMAL LOW (ref 8.9–10.3)
GFR calc Af Amer: >60 mL/min (ref >60)
GFR calc non Af Amer: >60 mL/min (ref >60)
GLUCOSE: 112 mg/dL — AB (ref 65–99)
POTASSIUM: 4 mmol/L (ref 3.5–5.1)
SODIUM: 137 mmol/L (ref 135–145)

## 2015-05-20 LAB — TROPONIN I: Troponin I: <0.03 ng/mL (ref <0.031)

## 2015-05-20 NOTE — Discharge Instructions (Signed)
Aspirin and Your Heart  Aspirin is a medicine that affects the way blood clots. Aspirin can be used to help reduce the risk of blood clots, heart attacks, and other heart-related problems.  SHOULD I TAKE ASPIRIN? Your health care provider will help you determine whether it is safe and beneficial for you to take aspirin daily. Taking aspirin daily may be beneficial if you:  Have had a heart attack or chest pain.  Have undergone open heart surgery such as coronary artery bypass surgery (CABG).  Have had coronary angioplasty.  Have experienced a stroke or transient ischemic attack (TIA).  Have peripheral vascular disease (PVD).  Have chronic heart rhythm problems such as atrial fibrillation. ARE THERE ANY RISKS OF TAKING ASPIRIN DAILY? Daily use of aspirin can increase your risk of side effects. Some of these include:  Bleeding. Bleeding problems can be minor or serious. An example of a minor problem is a cut that does not stop bleeding. An example of a more serious problem is stomach bleeding or bleeding into the brain. Your risk of bleeding is increased if you are also taking non-steroidal anti-inflammatory medicine (NSAIDs).  Increased bruising.  Upset stomach.  An allergic reaction. People who have nasal polyps have an increased risk of developing an aspirin allergy. WHAT ARE SOME GUIDELINES I SHOULD FOLLOW WHEN TAKING ASPIRIN?   Take aspirin only as directed by your health care provider. Make sure you understand how much you should take and what form you should take. The two forms of aspirin are:  Non-enteric-coated. This type of aspirin does not have a coating and is absorbed quickly. Non-enteric-coated aspirin is usually recommended for people with chest pain. This type of aspirin also comes in a chewable form.  Enteric-coated. This type of aspirin has a special coating that releases the medicine very slowly. Enteric-coated aspirin causes less stomach upset than non-enteric-coated  aspirin. This type of aspirin should not be chewed or crushed.  Drink alcohol in moderation. Drinking alcohol increases your risk of bleeding. WHEN SHOULD I SEEK MEDICAL CARE?   You have unusual bleeding or bruising.  You have stomach pain.  You have an allergic reaction. Symptoms of an allergic reaction include:  Hives.  Itchy skin.  Swelling of the lips, tongue, or face.  You have ringing in your ears. WHEN SHOULD I SEEK IMMEDIATE MEDICAL CARE?   Your bowel movements are bloody, dark red, or black in color.  You vomit or cough up blood.  You have blood in your urine.  You cough, wheeze, or feel short of breath. If you have any of the following symptoms, this is an emergency. Do not wait to see if the pain will go away. Get medical help at once. Call your local emergency services (911 in the [place] Do not drive yourself to the hospital.  You have severe chest pain, especially if the pain is crushing or pressure-like and spreads to the arms, back, neck, or jaw.  You have stroke-like symptoms, such as:   Loss of vision.   Difficulty talking.   Numbness or weakness on one side of your body.   Numbness or weakness in your arm or leg.   Not thinking clearly or feeling confused.    This information is not intended to replace advice given to you by your health care provider. Make sure you discuss any questions you have with your health care provider.   Document Released: 02/13/2008 Document Revised: 03/23/2014 Document Reviewed: 06/07/2013 Elsevier Interactive Patient Education 2016 Elsevier  Inc.  Nonspecific Chest Pain It is often hard to find the cause of chest pain. There is always a chance that your pain could be related to something serious, such as a heart attack or a blood clot in your lungs. Chest pain can also be caused by conditions that are not life-threatening. If you have chest pain, it is very important to follow up with your doctor.  HOME CARE  If  you were prescribed an antibiotic medicine, finish it all even if you start to feel better.  Avoid any activities that cause chest pain.  Do not use any tobacco products, including cigarettes, chewing tobacco, or electronic cigarettes. If you need help quitting, ask your doctor.  Do not drink alcohol.  Take medicines only as told by your doctor.  Keep all follow-up visits as told by your doctor. This is important. This includes any further testing if your chest pain does not go away.  Your doctor may tell you to keep your head raised (elevated) while you sleep.  Make lifestyle changes as told by your doctor. These may include:  Getting regular exercise. Ask your doctor to suggest some activities that are safe for you.  Eating a heart-healthy diet. Your doctor or a diet specialist (dietitian) can help you to learn healthy eating options.  Maintaining a healthy weight.  Managing diabetes, if necessary.  Reducing stress. GET HELP IF:  Your chest pain does not go away, even after treatment.  You have a rash with blisters on your chest.  You have a fever. GET HELP RIGHT AWAY IF:  Your chest pain is worse.  You have an increasing cough, or you cough up blood.  You have severe belly (abdominal) pain.  You feel extremely weak.  You pass out (faint).  You have chills.  You have sudden, unexplained chest discomfort.  You have sudden, unexplained discomfort in your arms, back, neck, or jaw.  You have shortness of breath at any time.  You suddenly start to sweat, or your skin gets clammy.  You feel nauseous.  You vomit.  You suddenly feel light-headed or dizzy.  Your heart begins to beat quickly, or it feels like it is skipping beats. These symptoms may be an emergency. Do not wait to see if the symptoms will go away. Get medical help right away. Call your local emergency services (911 in the [place] Do not drive yourself to the hospital.   This information is not  intended to replace advice given to you by your health care provider. Make sure you discuss any questions you have with your health care provider.   Document Released: 08/19/2007 Document Revised: 03/23/2014 Document Reviewed: 10/06/2013 Elsevier Interactive Patient Education [employer]2016 Elsevier Inc.

## 2015-05-20 NOTE — ED Notes (Signed)
Pt reports last night with pain in center of chest "pressure".  Reports now feels soreness and weakness.  Chest pain has resolved.  Reports feels that pain is "coming back on".  Also with asymmetry of the face on the L side, hx of bells palsy on same.  Reports this symptom started 2-3 days ago.  Has had fatigue.

## 2015-05-20 NOTE — ED Notes (Signed)
Patient asked to change into a gown.  

## 2015-05-24 ENCOUNTER — Emergency Department (HOSPITAL_COMMUNITY)
Admission: EM | Admit: 2015-05-24 | Discharge: 2015-05-24 | Disposition: A | Discharge status: Home / Self Care | Payer: Self-pay | Attending: Emergency Medicine | Admitting: Emergency Medicine

## 2015-05-24 ENCOUNTER — Encounter (HOSPITAL_COMMUNITY): Payer: Self-pay | Admitting: Vascular Surgery

## 2015-05-24 DIAGNOSIS — K0889 Other specified disorders of teeth and supporting structures: Secondary | ICD-10-CM | POA: Insufficient documentation

## 2015-05-24 DIAGNOSIS — Z880 Allergy status to penicillin: Secondary | ICD-10-CM | POA: Insufficient documentation

## 2015-05-24 DIAGNOSIS — Z8673 Personal history of transient ischemic attack (TIA), and cerebral infarction without residual deficits: Secondary | ICD-10-CM | POA: Insufficient documentation

## 2015-05-24 DIAGNOSIS — Z79899 Other long term (current) drug therapy: Secondary | ICD-10-CM | POA: Insufficient documentation

## 2015-05-24 DIAGNOSIS — Z8669 Personal history of other diseases of the nervous system and sense organs: Secondary | ICD-10-CM | POA: Insufficient documentation

## 2015-05-24 DIAGNOSIS — M1990 Unspecified osteoarthritis, unspecified site: Secondary | ICD-10-CM | POA: Insufficient documentation

## 2015-05-24 MED ORDER — IBUPROFEN 400 MG PO TABS: 400.0000 mg | ORAL_TABLET | Freq: Four times a day (QID) | ORAL | Status: DC | PRN

## 2015-05-24 MED ORDER — CLINDAMYCIN HCL 150 MG PO CAPS: 450.0000 mg | ORAL_CAPSULE | Freq: Three times a day (TID) | ORAL | Status: DC

## 2015-05-24 NOTE — ED Notes (Signed)
Pt reports to the ED for eval of dental abscess to right lower jaw. Some external swelling noted. Airway intact. Pt A&Ox4, resp e/u, and skin warm and dry.

## 2015-05-24 NOTE — Discharge Instructions (Signed)
Please follow-up with your dentist or use the attest resources to find a dentist for definitive care. Take your antibiotics as prescribed. Do not save or share them. Return to ED for new or worsening symptoms as we discussed.  [facility]  [facility]  [address]  [place], [place] [address]  Phone [phone]  [facility] [employer] [facility] [place] exemplifies the [employer] vision to improve the health and quality of life of all [place] by Public house managercreating leaders with a passion to care for the underserved and by leading the nation in community-based, service learning oral health education. We are committed to offering comprehensive general dental services for adults, children and special needs patients in a safe, caring and professional setting.  Appointments: Our clinic is open Monday through Friday 8:00 a.m. until 5:00 p.m. The amount of time scheduled for an appointment depends on the patients specific needs. We ask that you keep your appointed time for care or provide 24-hour notice of all appointment changes. Parents or legal guardians must accompany minor children.  Payment for Services: Medicaid and other insurance plans are welcome. Payment for services is due when services are rendered and may be made by cash or credit card. If you have dental insurance, we will assist you with your claim submission.   Emergencies: Emergency services will be provided Monday through Friday on a walk-in basis. Please arrive early for emergency services. After hours emergency services will be provided for patients of record as required.  Services:  Medical illustratorComprehensive General Dentistry  Childrens Dentistry  Oral Surgery - Extractions  Root Canals  Sealants and Tooth Colored Fillings  Crowns and Bridges  Dentures and Partial Dentures   Implant Services  Periodontal Services and Cleanings  Cosmetic Building services engineerTooth Whitening  Digital Radiography  3-D/[facility] Imaging

## 2015-05-24 NOTE — ED Provider Notes (Signed)
CSN: [id number]     Arrival date & time 05/24/15  1338 History  By signing my name below, I, Edward Weaver, attest that this documentation has been prepared under the direction and in the presence of Edward Peek, PA-C Electronically Signed: Charline Bills, ED Scribe 05/24/2015 at 2:22 PM.   Chief Complaint  Patient presents with  . Dental Pain   The history is provided by the patient. No language interpreter was used.   HPI Comments: Edward Weaver is a 60 y.o. male who presents to the Emergency Department complaining of gradually worsening right lower dental pain onset yesterday. Pt reports associated right lower gum swelling onset last night. He denies difficulty swallowing, Difficulty breathing and fever. Pt does not currently have a dentist but plans to go to [facility] in [place]. Allergy to penicillin.   Past Medical History  Diagnosis Date  . Hiatal hernia   . TIA (transient ischemic attack)     Nov and/or Dec 2014  . Edema   . Stroke (HCC)   . Bell's palsy   . Arthritis    Past Surgical History  Procedure Laterality Date  . Knee surgery Right     1974  . Esophagus surgery    . Wisdom tooth extraction    . Tonsillectomy     Family History  Problem Relation Age of Onset  . Hypertension Mother     Living  . Hypertension Father   . Hypertension Brother   . Stroke Brother 64    Deceased  . Cancer - Other Mother     Oral  . Alzheimer's disease Father     Living  . Alzheimer's disease Paternal Aunt   . Stroke Paternal Aunt     #1  . Dementia Paternal Aunt     #2  . Healthy Brother     x1  . Healthy Son     x3   Social History  Substance Use Topics  . Smoking status: Never Smoker   . Smokeless tobacco: Never Used  . Alcohol Use: No    Review of Systems  Constitutional: Negative for fever.  HENT: Positive for dental problem. Negative for trouble swallowing.   All other systems reviewed and are negative.  Allergies  Penicillins  Home  Medications   Prior to Admission medications   Medication Sig Start Date End Date Taking? Authorizing Provider  artificial tears (LACRILUBE) OINT ophthalmic ointment Place into the left eye every hour while awake. 06/28/14   Pricilla Loveless, MD  aspirin 325 MG tablet Take 325 mg by mouth daily.    Historical Provider, MD  clindamycin (CLEOCIN) 150 MG capsule Take 3 capsules (450 mg total) by mouth 3 (three) times daily. X 7 days 05/24/15   Edward Peek, PA-C  furosemide (LASIX) 20 MG tablet Take 1 tablet (20 mg total) by mouth daily. 12/09/13 12/12/13  Arby Barrette, MD  ibuprofen (ADVIL,MOTRIN) 400 MG tablet Take 1 tablet (400 mg total) by mouth every 6 (six) hours as needed. 05/24/15   Edward Peek, PA-C  predniSONE (DELTASONE) 20 MG tablet Take 60 mg po daily x 4 days, then 50 mg x 1 day, then 40 mg x 1 day, then 30 mg x 1 day, then 20 mg x 1 day, then 10 mg x 1 day and then stop. 06/29/14   Pricilla Loveless, MD  valACYclovir (VALTREX) 1000 MG tablet Take 1 tablet (1,000 mg total) by mouth 3 (three) times daily. 06/28/14   Pricilla Loveless, MD  BP 166/96 mmHg  Pulse 63  Temp(Src) 97.4 F (36.3 C) (Oral)  Resp 16  SpO2 96% Physical Exam  Constitutional: He is oriented to person, place, and time. He appears well-developed and well-nourished. No distress.  Awake, alert, nontoxic appearance.  HENT:  Head: Normocephalic and atraumatic.  Discomfort located to R mandibular premolar region. Overall poor dentition. Multiple missing teeth with active caries. Mucous membranes are moist. No unilateral tonsillar swelling, uvula midline, no glossal swelling or elevation. No trismus. No fluctuance or evidence of a drainable abscess. No other evidence of emergent infection, Retropharyngeal or Peritonsillar abscess, Ludwig or Vincents angina. Tolerating secretions well. Patent airway  Eyes: Conjunctivae and EOM are normal. Right eye exhibits no discharge. Left eye exhibits no discharge.  Neck: Neck supple.  No tracheal deviation present.  Cardiovascular: Normal rate.   Pulmonary/Chest: Effort normal. No respiratory distress. He exhibits no tenderness.  Abdominal: Soft. There is no tenderness. There is no rebound.  Musculoskeletal: Normal range of motion. He exhibits no tenderness.  Baseline ROM, no obvious new focal weakness.  Neurological: He is alert and oriented to person, place, and time.  Mental status and motor strength appears baseline for patient and situation.  Skin: Skin is warm and dry. No rash noted.  Psychiatric: He has a normal mood and affect. His behavior is normal.  Nursing note and vitals reviewed.  ED Course  Procedures (including critical care time) DIAGNOSTIC STUDIES: Oxygen Saturation is 96% on RA, normal by my interpretation.    COORDINATION OF CARE: 2:16 PM-Discussed treatment plan with pt at bedside and pt agreed to plan.   Labs Review Labs Reviewed - No data to display  Imaging Review No results found.   EKG Interpretation None     Meds given in ED:  Medications - No data to display  Discharge Medication List as of 05/24/2015  2:39 PM    START taking these medications   Details  ibuprofen (ADVIL,MOTRIN) 400 MG tablet Take 1 tablet (400 mg total) by mouth every 6 (six) hours as needed., Starting 05/24/2015, Until Discontinued, Print       Filed Vitals:   05/24/15 1354 05/24/15 1431  BP: 166/96 163/93  Pulse: 63 64  Temp: 97.4 F (36.3 C)   TempSrc: Oral   Resp: 16 16  SpO2: 96% 100%    MDM  Here for evaluation of dental pain. On exam, there is no evidence of a drainable abscess. No trismus, glossal elevation, unilateral tonsillar swelling. No evidence of retropharyngeal or peritonsillar abscess or Ludwig angina. Patient Refused dental block, accepts Motrin rx. Discharged with outpatient dental resources. Also given prescription for anti-inflammatories and antibiotics. Overall, appears well, nontoxic and appropriate for discharge.  Final  diagnoses:  Pain, dental   I personally performed the services described in this documentation, which was scribed in my presence. The recorded information has been reviewed and is accurate.    Edward PeekBenjamin Canyon Lohr, PA-C 05/24/15 1446  Laurence Spatesachel Morgan Little, MD 05/26/15 [phone]

## 2015-05-24 NOTE — ED Notes (Signed)
C/O RIGHT LOWER DENTAL ABSCESS. DOES NOT HAVE A DENTIST BUT PLANS TO GO TO [facility]ORD[facility] IN [place].

## 2015-05-25 ENCOUNTER — Emergency Department (HOSPITAL_BASED_OUTPATIENT_CLINIC_OR_DEPARTMENT_OTHER)
Admission: EM | Admit: 2015-05-25 | Discharge: 2015-05-25 | Disposition: A | Discharge status: Home / Self Care | Payer: Self-pay | Attending: Emergency Medicine | Admitting: Emergency Medicine

## 2015-05-25 ENCOUNTER — Encounter (HOSPITAL_BASED_OUTPATIENT_CLINIC_OR_DEPARTMENT_OTHER): Payer: Self-pay | Admitting: *Deleted

## 2015-05-25 DIAGNOSIS — K0889 Other specified disorders of teeth and supporting structures: Secondary | ICD-10-CM | POA: Insufficient documentation

## 2015-05-25 NOTE — ED Notes (Signed)
[facility]

## 2015-05-25 NOTE — ED Notes (Signed)
Pt reports dental pain, has been seen multiple times for same.  Reports that he needs pain medication.

## 2015-05-27 ENCOUNTER — Encounter: Payer: Self-pay | Admitting: Internal Medicine

## 2015-07-20 NOTE — ED Provider Notes (Signed)
CSN: [id number]     Arrival date & time 05/20/15  1812 History   First MD Initiated Contact with Patient 05/20/15 1925     Chief Complaint  Patient presents with  . Chest Pain      HPI  Expand All Collapse All   Pt reports last night with pain in center of chest "pressure". Reports now feels soreness and weakness. Chest pain has resolved. Reports feels that pain is "coming back on". Also with asymmetry of the face on the L side, hx of bells palsy on same. Reports this symptom started 2-3 days ago. Has had fatigue.        Past Medical History  Diagnosis Date  . Hiatal hernia   . TIA (transient ischemic attack)     Nov and/or Dec 2014  . Edema   . Stroke (HCC)   . Bell's palsy   . Arthritis    Past Surgical History  Procedure Laterality Date  . Knee surgery Right     1974  . Esophagus surgery    . Wisdom tooth extraction    . Tonsillectomy     Family History  Problem Relation Age of Onset  . Hypertension Mother     Living  . Hypertension Father   . Hypertension Brother   . Stroke Brother 15    Deceased  . Cancer - Other Mother     Oral  . Alzheimer's disease Father     Living  . Alzheimer's disease Paternal Aunt   . Stroke Paternal Aunt     #1  . Dementia Paternal Aunt     #2  . Healthy Brother     x1  . Healthy Son     x3   Social History  Substance Use Topics  . Smoking status: Never Smoker   . Smokeless tobacco: Never Used  . Alcohol Use: No    Review of Systems  All other systems reviewed and are negative.     Allergies  Penicillins  Home Medications   Prior to Admission medications   Medication Sig Start Date End Date Taking? Authorizing Provider  aspirin 325 MG tablet Take 325 mg by mouth daily.   Yes Historical Provider, MD  artificial tears (LACRILUBE) OINT ophthalmic ointment Place into the left eye every hour while awake. 06/28/14   Pricilla Loveless, MD  clindamycin (CLEOCIN) 150 MG capsule Take 3 capsules (450 mg total) by mouth  3 (three) times daily. X 7 days 05/24/15   [name], PA-C  furosemide (LASIX) 20 MG tablet Take 1 tablet (20 mg total) by mouth daily. 12/09/13 12/12/13  Arby Barrette, MD  ibuprofen (ADVIL,MOTRIN) 400 MG tablet Take 1 tablet (400 mg total) by mouth every 6 (six) hours as needed. 05/24/15   Benjamin Cartner, PA-C   BP 134/81 mmHg  Pulse 60  Temp(Src) 98.2 F (36.8 C) (Oral)  Resp 18  Ht  (1.676 m)  Wt 265 lb (120.203 kg)  BMI 42.79 kg/m2  SpO2 98% Physical Exam  Constitutional: He is oriented to person, place, and time. He appears well-developed and well-nourished. No distress.  HENT:  Head: Normocephalic and atraumatic.  Eyes: Pupils are equal, round, and reactive to light.  Neck: Normal range of motion.  Cardiovascular: Normal rate and intact distal pulses.   Pulmonary/Chest: No respiratory distress.  Abdominal: Normal appearance. He exhibits no distension.  Musculoskeletal: Normal range of motion.  Neurological: He is alert and oriented to person, place, and time. No cranial nerve  deficit.  Skin: Skin is warm and dry. No rash noted.  Psychiatric: He has a normal mood and affect. His behavior is normal.  Nursing note and vitals reviewed.   ED Course  Procedures (including critical care time) Labs Review Labs Reviewed  BASIC METABOLIC PANEL - Abnormal; Notable for the following:    Glucose, Bld 112 (*)    Calcium 8.5 (*)    All other components within normal limits  CBC  TROPONIN I       EKG Interpretation   Date/Time:  Monday May 20 2015 18:20:09 EST Ventricular Rate:  60 PR Interval:  158 QRS Duration: 110 QT Interval:  406 QTC Calculation: 406 R Axis:   56 Text Interpretation:  Normal sinus rhythm Incomplete right bundle branch  block Borderline ECG ED PHYSICIAN INTERPRETATION AVAILABLE IN [facility] Confirmed by TEST, Record ([id number]) on 05/21/2015 7:24:48 AM     Low clinical suspicion that this is acute coronary syndrome. After treatment  in the ED the patient feels back to baseline and wants to go home. MDM   Final diagnoses:  Chest pain, unspecified chest pain type        Nelva Nayobert Olie Scaffidi, MD 07/20/15 1054

## 2015-09-22 ENCOUNTER — Encounter (HOSPITAL_BASED_OUTPATIENT_CLINIC_OR_DEPARTMENT_OTHER): Payer: Self-pay | Admitting: Emergency Medicine

## 2015-09-22 ENCOUNTER — Emergency Department (HOSPITAL_BASED_OUTPATIENT_CLINIC_OR_DEPARTMENT_OTHER)
Admission: EM | Admit: 2015-09-22 | Discharge: 2015-09-22 | Disposition: A | Discharge status: Home / Self Care | Payer: Self-pay | Attending: Emergency Medicine | Admitting: Emergency Medicine

## 2015-09-22 DIAGNOSIS — Z7982 Long term (current) use of aspirin: Secondary | ICD-10-CM | POA: Insufficient documentation

## 2015-09-22 DIAGNOSIS — Z8673 Personal history of transient ischemic attack (TIA), and cerebral infarction without residual deficits: Secondary | ICD-10-CM | POA: Insufficient documentation

## 2015-09-22 DIAGNOSIS — K0889 Other specified disorders of teeth and supporting structures: Secondary | ICD-10-CM | POA: Insufficient documentation

## 2015-09-22 MED ORDER — CLINDAMYCIN HCL 150 MG PO CAPS: 300.0000 mg | ORAL_CAPSULE | Freq: Four times a day (QID) | ORAL | Status: DC

## 2015-09-22 NOTE — ED Notes (Signed)
Pt in c/o dental pain and requesting abx and referral to dental services that will treat him w/o insurance. Ambulatory in [place].

## 2015-09-22 NOTE — Discharge Instructions (Signed)
Please read and follow all provided instructions.  Your diagnoses today include:  1. Pain, dental    The exam and treatment you received today has been provided on an emergency basis only. This is not a substitute for complete medical or dental care.  Tests performed today include:  Vital signs. See below for your results today.   Medications prescribed:   Clindamycin - antibiotic  You have been prescribed an antibiotic medicine: take the entire course of medicine even if you are feeling better. Stopping early can cause the antibiotic not to work.  Take any prescribed medications only as directed.  Home care instructions:  Follow any educational materials contained in this packet.  Follow-up instructions: Please follow-up with your dentist for further evaluation of your symptoms.   Dental Assistance: See below for dental referrals  Return instructions:   Please return to the Emergency Department if you experience worsening symptoms.  Please return if you develop a fever, you develop more swelling in your face or neck, you have trouble breathing or swallowing food.  Please return if you have any other emergent concerns.  Additional Information:  Your vital signs today were: BP 135/96 mmHg   Pulse 73   Temp(Src) 98.1 F (36.7 C) (Oral)   Resp 18   Ht 5\' 6"  (1.676 m)   Wt 117.935 kg   BMI 41.99 kg/m2   SpO2 99% If your blood pressure (BP) was elevated above 135/85 this visit, please have this repeated by your doctor within one month. -------------- [employer] is a great source of information about community services available.  Access by dialing 2-1-1 from anywhere in [place] , or by website -  PooledIncome.plwww.nc211.org.   Other Local Resources (Updated 03/2015)  Dental  Care   Services    Phone Number and Address  Cost  [facility] For children 770 - 60 years of age:   Cleaning  Tooth  brushing/flossing instruction  Sealants, fillings, crowns  Extractions  Emergency treatment  [phone] [address] [place], [place] [id number] Charges based on family income.  Medicaid and some insurance plans accepted.     [facility]  Cleaning  Sealants, fillings, crowns  Extractions  Emergency treatment [phone] [address] [place], [place]  Pregnant women 60 years of age or older with a Medicaid card  [facility] [place]  Cleaning  Sealants, fillings, crowns  Extractions  Emergency treatment [phone] [address] [place], [place] Pregnant women 60 years of age or older with a Medicaid card  [facility] For children 70 - 60 years of age:   Cleaning  Tooth brushing/flossing instruction  Sealants, fillings, crowns  Extractions  Emergency treatment Limited orthodontic services for patients with Medicaid [phone] [address] [place], [place] [address] Medicaid and [facility] Health Choice cover for children up to age 60 and pregnant women.  Parents of children up to age 60 without Medicaid pay a reduced fee at time of service.  [facility] [place] For children 880 - 60 years of age:   Cleaning  Tooth brushing/flossing instruction  Sealants, fillings, crowns  Extractions  Emergency treatment Limited orthodontic services for patients with Medicaid [phone] [address] [place], [place].  Medicaid and [place] Health Choice cover for children up to age 60 and pregnant women.  Parents of children up to age 88 without Medicaid pay a reduced fee.  [facility]  Sealants, fillings, crowns  Extractions  Hours: Tuesdays and Thursdays, 4:15 - 8 pm [phone] [address] [place], [place] [address] Services free of  charge to [facility] residents ages 18-64 who do not have health insurance, Medicare, [place], or [place] benefits and fall within federal poverty guidelines  [employer]    Provides dental care in addition to primary medical care, nutritional counseling, and pharmacy:  Nurse, mental health, fillings, crowns  Extractions                  [phone] [facility], [address] [place], [place]  [phone] [facility], [address] [place], [place]  [phone] [facility] [place], [place]  [phone] [facility], [address] [place], [place]  [phone] [facility] [address] [place], [place] Accepts [place], [place], most insurance.  Also provides services available to all with fees adjusted based on ability to pay.    [facility]  Cleaning  Tooth brushing/flossing instruction  Sealants, fillings, crowns  Extractions  Emergency treatment Hours: Tuesdays, Thursdays, and Fridays from 8 am to 5 pm by appointment only. [phone] [address] [place], [place] [address] [facility] residents with Medicaid (depending on eligibility) and children with [facility] Health Choice - call for more information.  [facility]  Extractions only  Hours: 2nd and 4th Thursday of each month from 6:30 am - 9 am.   [phone]. [phone] [place], [place] [address] Ages 69 and older only.  Patients are seen on a first come, first served basis.  [employer]  [employer]  Extractions  Orthodontics  Endodontics  Implants/Crowns/Bridges  Complete and partial dentures [phone] [place], [place] Patients must complete an application for services.  There is often a waiting list.

## 2015-09-22 NOTE — ED Provider Notes (Signed)
CSN: [id number]     Arrival date & time 09/22/15  1410 History   First MD Initiated Contact with Patient 09/22/15 1434     Chief Complaint  Patient presents with  . Dental Pain     (Consider location/radiation/quality/duration/timing/severity/associated sxs/prior Treatment) HPI Comments: Patient presents with complaint of acute on chronic dental pain, mainly in his left upper incisor. Patient has had some intermittent, waxing and waning, facial swelling as well. No fevers, difficulty breathing or swallowing. The onset of this condition was acute. The course is constant. Aggravating factors: chewing. Alleviating factors: none.    Patient is a 60 y.o. male presenting with tooth pain. The history is provided by the patient.  Dental Pain Associated symptoms: facial swelling   Associated symptoms: no fever, no headaches and no neck pain     Past Medical History  Diagnosis Date  . Hiatal hernia   . TIA (transient ischemic attack)     Nov and/or Dec 2014  . Edema   . Stroke (HCC)   . Bell's palsy   . Arthritis    Past Surgical History  Procedure Laterality Date  . Knee surgery Right     1974  . Esophagus surgery    . Wisdom tooth extraction    . Tonsillectomy     Family History  Problem Relation Age of Onset  . Hypertension Mother     Living  . Hypertension Father   . Hypertension Brother   . Stroke Brother 6762    Deceased  . Cancer - Other Mother     Oral  . Alzheimer's disease Father     Living  . Alzheimer's disease Paternal Aunt   . Stroke Paternal Aunt     #1  . Dementia Paternal Aunt     #2  . Healthy Brother     x1  . Healthy Son     x3   Social History  Substance Use Topics  . Smoking status: Never Smoker   . Smokeless tobacco: Never Used  . Alcohol Use: No    Review of Systems  Constitutional: Negative for fever.  HENT: Positive for dental problem and facial swelling. Negative for ear pain, sore throat and trouble swallowing.   Respiratory:  Negative for shortness of breath and stridor.   Musculoskeletal: Negative for neck pain.  Skin: Negative for color change.  Neurological: Negative for headaches.      Allergies  Penicillins  Home Medications   Prior to Admission medications   Medication Sig Start Date End Date Taking? Authorizing Provider  artificial tears (LACRILUBE) OINT ophthalmic ointment Place into the left eye every hour while awake. 06/28/14   Pricilla LovelessScott Goldston, MD  aspirin 325 MG tablet Take 325 mg by mouth daily.    Historical Provider, MD  clindamycin (CLEOCIN) 150 MG capsule Take 2 capsules (300 mg total) by mouth every 6 (six) hours. 09/22/15   Renne CriglerJoshua Adrielle Polakowski, PA-C  furosemide (LASIX) 20 MG tablet Take 1 tablet (20 mg total) by mouth daily. 12/09/13 12/12/13  Arby BarretteMarcy Pfeiffer, MD  ibuprofen (ADVIL,MOTRIN) 400 MG tablet Take 1 tablet (400 mg total) by mouth every 6 (six) hours as needed. 05/24/15   Benjamin Cartner, PA-C   BP 135/96 mmHg  Pulse 73  Temp(Src) 98.1 F (36.7 C) (Oral)  Resp 18  Ht 5\' 6"  (1.676 m)  Wt 117.935 kg  BMI 41.99 kg/m2  SpO2 99% Physical Exam  Constitutional: He appears well-developed and well-nourished.  HENT:  Head: Normocephalic and atraumatic.  Right Ear: Tympanic membrane, external ear and ear canal normal.  Left Ear: Tympanic membrane, external ear and ear canal normal.  Nose: Nose normal.  Mouth/Throat: Uvula is midline, oropharynx is clear and moist and mucous membranes are normal. No trismus in the jaw. Abnormal dentition. Dental caries present. No dental abscesses or uvula swelling. No tonsillar abscesses.  Patient with multiple missing teeth in very poor overall dentition with advanced periodontal disease. Patient has redness at the base of tooth #10? But no palpable abscess. No gross facial swelling at time of exam.  Eyes: Pupils are equal, round, and reactive to light.  Neck: Normal range of motion. Neck supple.  No neck swelling or Lugwig's angina  Neurological: He is  alert.  Skin: Skin is warm and dry.  Psychiatric: He has a normal mood and affect.  Nursing note and vitals reviewed.   ED Course  Procedures (including critical care time)  Patient seen and examined.   Vital signs reviewed and are as follows: BP 135/96 mmHg  Pulse 73  Temp(Src) 98.1 F (36.7 C) (Oral)  Resp 18  Ht  (1.676 m)  Wt 117.935 kg  BMI 41.99 kg/m2  SpO2 99%  Urged not to drink alcohol, drive, or perform any other activities that requires focus while taking these medications. The patient verbalizes understanding and agrees with the plan.  Patient counseled to take prescribed medications as directed, return with worsening facial or neck swelling, and to follow-up with their dentist as soon as possible.    MDM   Final diagnoses:  Pain, dental   Patient with toothache. No fever. Exam unconcerning for Ludwig's angina or other deep tissue infection in neck.   As there is gum swelling, erythema, and facial swelling, will treat with antibiotic. Urged patient to follow-up with dentist. Referrals given.      Renne Crigler, PA-C 09/22/15 1546  Alvira Monday, MD 09/22/15 2216

## 2016-03-30 ENCOUNTER — Emergency Department (HOSPITAL_COMMUNITY)
Admission: EM | Admit: 2016-03-30 | Discharge: 2016-03-30 | Disposition: A | Discharge status: Home / Self Care | Payer: Self-pay | Attending: Emergency Medicine | Admitting: Emergency Medicine

## 2016-03-30 ENCOUNTER — Encounter (HOSPITAL_COMMUNITY): Payer: Self-pay | Admitting: Oncology

## 2016-03-30 ENCOUNTER — Emergency Department (HOSPITAL_COMMUNITY): Payer: Self-pay

## 2016-03-30 DIAGNOSIS — Z79899 Other long term (current) drug therapy: Secondary | ICD-10-CM | POA: Insufficient documentation

## 2016-03-30 DIAGNOSIS — Z7982 Long term (current) use of aspirin: Secondary | ICD-10-CM | POA: Insufficient documentation

## 2016-03-30 DIAGNOSIS — R69 Illness, unspecified: Secondary | ICD-10-CM

## 2016-03-30 DIAGNOSIS — J111 Influenza due to unidentified influenza virus with other respiratory manifestations: Secondary | ICD-10-CM | POA: Insufficient documentation

## 2016-03-30 DIAGNOSIS — Z8673 Personal history of transient ischemic attack (TIA), and cerebral infarction without residual deficits: Secondary | ICD-10-CM | POA: Insufficient documentation

## 2016-03-30 MED ORDER — ACETAMINOPHEN 500 MG PO TABS
1000.0000 mg | ORAL_TABLET | Freq: Once | ORAL | Status: AC
  Administered 2016-03-30: 1000 mg via ORAL
  Filled 2016-03-30: qty 2

## 2016-03-30 MED ORDER — IBUPROFEN 200 MG PO TABS
600.0000 mg | ORAL_TABLET | Freq: Once | ORAL | Status: AC
  Administered 2016-03-30: 600 mg via ORAL
  Filled 2016-03-30: qty 3

## 2016-03-30 NOTE — Discharge Instructions (Signed)
If you were given medicines take as directed.  If you are on coumadin or contraceptives realize their levels and effectiveness is altered by many different medicines.  If you have any reaction (rash, tongues swelling, other) to the medicines stop taking and see a physician.    If your blood pressure was elevated in the ER make sure you follow up for management with a primary doctor or return for chest pain, shortness of breath or stroke symptoms.  Please follow up as directed and return to the ER or see a physician for new or worsening symptoms.  Thank you. Vitals:   03/30/16 0116  BP: 128/97  Pulse: 73  Resp: 20  Temp: 100.4 F (38 C)  TempSrc: Oral  SpO2: 96%  Weight: 263 lb 9.6 oz (119.6 kg)  Height: 5\' 6"  (1.676 m)

## 2016-03-30 NOTE — ED Triage Notes (Signed)
Pt presents d/t cough, congestion, abdominal pain, nausea x 3 days.  Pt has tried OTC medication w/o relief.  Pt also c/o hiccups.

## 2016-03-30 NOTE — ED Provider Notes (Signed)
[facility]-EMERGENCY DEPT Provider Note   CSN: [id number] Arrival date & time: 03/30/16  0107     History   Chief Complaint Chief Complaint  Patient presents with  . Cough    HPI Edward Weaver is a 61 y.o. male.  Patient presents with cough and fever for 3 days. Wife is also sick. Patient denies history of heart problems. Patient also has body aches. Symptoms intermittent.   The history is provided by the patient.  Cough  Pertinent negatives include no chest pain, no chills, no headaches and no shortness of breath.    Past Medical History:  Diagnosis Date  . Arthritis   . Bell's palsy   . Edema   . Hiatal hernia   . Stroke (HCC)   . TIA (transient ischemic attack)    Nov and/or Dec 2014    Patient Active Problem List   Diagnosis Date Noted  . Anxiety 08/02/2013  . Right ankle pain 08/02/2013  . TIA (transient ischemic attack) 08/02/2013  . Visit for preventive health examination 07/24/2013  . OA (osteoarthritis) of knee 07/24/2013  . Venous insufficiency of right lower extremity 07/24/2013    Past Surgical History:  Procedure Laterality Date  . ESOPHAGUS SURGERY    . KNEE SURGERY Right    1974  . TONSILLECTOMY    . WISDOM TOOTH EXTRACTION         Home Medications    Prior to Admission medications   Medication Sig Start Date End Date Taking? Authorizing Provider  artificial tears (LACRILUBE) OINT ophthalmic ointment Place into the left eye every hour while awake. 06/28/14   Pricilla LovelessScott Goldston, MD  aspirin 325 MG tablet Take 325 mg by mouth daily.    Historical Provider, MD  clindamycin (CLEOCIN) 150 MG capsule Take 2 capsules (300 mg total) by mouth every 6 (six) hours. 09/22/15   Renne CriglerJoshua Geiple, PA-C  furosemide (LASIX) 20 MG tablet Take 1 tablet (20 mg total) by mouth daily. 12/09/13 12/12/13  Arby BarretteMarcy Pfeiffer, MD  ibuprofen (ADVIL,MOTRIN) 400 MG tablet Take 1 tablet (400 mg total) by mouth every 6 (six) hours as needed. 05/24/15   Joycie PeekBenjamin Cartner, PA-C    Family  History Family History  Problem Relation Age of Onset  . Hypertension Mother     Living  . Cancer - Other Mother     Oral  . Hypertension Father   . Alzheimer's disease Father     Living  . Hypertension Brother   . Stroke Brother [id number]    Deceased  . Alzheimer's disease Paternal Aunt   . Stroke Paternal Aunt     #1  . Dementia Paternal Aunt     #2  . Healthy Brother     x1  . Healthy Son     x3    Social History Social History  Substance Use Topics  . Smoking status: Never Smoker  . Smokeless tobacco: Never Used  . Alcohol use No     Allergies   Penicillins   Review of Systems Review of Systems  Constitutional: Positive for fever. Negative for chills.  HENT: Negative for congestion.   Eyes: Negative for visual disturbance.  Respiratory: Positive for cough. Negative for shortness of breath.   Cardiovascular: Negative for chest pain.  Gastrointestinal: Negative for abdominal pain and vomiting.  Genitourinary: Negative for dysuria and flank pain.  Musculoskeletal: Positive for arthralgias. Negative for back pain, neck pain and neck stiffness.  Skin: Positive for pallor. Negative for rash.  Neurological: Negative  for light-headedness and headaches.     Physical Exam Updated Vital Signs BP 158/98 (BP Location: Right Arm)   Pulse 75   Temp 100.4 F (38 C) (Oral)   Resp 18   Ht 5\' 6"  (1.676 m)   Wt 263 lb 9.6 oz (119.6 kg)   SpO2 97%   BMI 42.55 kg/m   Physical Exam  Constitutional: He appears well-developed and well-nourished.  HENT:  Head: Normocephalic and atraumatic.  Eyes: Conjunctivae are normal.  Neck: Neck supple.  Cardiovascular: Normal rate and regular rhythm.   No murmur heard. Pulmonary/Chest: Effort normal and breath sounds normal. No respiratory distress.  Abdominal: Soft. There is no tenderness.  Musculoskeletal: He exhibits no edema.  Neurological: He is alert.  Skin: Skin is warm and dry.  Psychiatric: He has a normal mood and  affect.  Nursing note and vitals reviewed.    ED Treatments / Results  Labs (all labs ordered are listed, but only abnormal results are displayed) Labs Reviewed - No data to display  EKG  EKG Interpretation None       Radiology Dg Chest 2 View  Result Date: 03/30/2016 CLINICAL DATA:  Productive cough for 3 days.  Chills and nausea. EXAM: CHEST  2 VIEW COMPARISON:  12/09/2013 FINDINGS: The cardiomediastinal contours are normal. The lungs are clear. Pulmonary vasculature is normal. No consolidation, pleural effusion, or pneumothorax. No acute osseous abnormalities are seen. IMPRESSION: No active cardiopulmonary disease. Electronically Signed   By: Rubye Oaks M.D.   On: 03/30/2016 02:04    Procedures Procedures (including critical care time)  Medications Ordered in ED Medications  acetaminophen (TYLENOL) tablet 1,000 mg (1,000 mg Oral Given 03/30/16 0532)  ibuprofen (ADVIL,MOTRIN) tablet 600 mg (600 mg Oral Given 03/30/16 0533)     Initial Impression / Assessment and Plan / ED Course  I have reviewed the triage vital signs and the nursing notes.  Pertinent labs & imaging results that were available during my care of the patient were reviewed by me and considered in my medical decision making (see chart for details).  Clinical Course    Patient presents with flulike illness or chest x-ray unremarkable. Discussed supportive care.  Results and differential diagnosis were discussed with the patient/parent/guardian. Xrays were independently reviewed by myself.  Close follow up outpatient was discussed, comfortable with the plan.   Medications  acetaminophen (TYLENOL) tablet 1,000 mg (1,000 mg Oral Given 03/30/16 0532)  ibuprofen (ADVIL,MOTRIN) tablet 600 mg (600 mg Oral Given 03/30/16 0533)    Vitals:   03/30/16 0116 03/30/16 0536  BP: 128/97 158/98  Pulse: 73 75  Resp: 20 18  Temp: 100.4 F (38 C)   TempSrc: Oral   SpO2: 96% 97%  Weight: 263 lb 9.6 oz (119.6 kg)    Height: 5\' 6"  (1.676 m)     Final diagnoses:  Influenza-like illness     Final Clinical Impressions(s) / ED Diagnoses   Final diagnoses:  Influenza-like illness    New Prescriptions New Prescriptions   No medications on file     Blane Ohara, MD 03/30/16 [phone]

## 2016-03-31 NOTE — ED Notes (Signed)
Patient wanted a copy of discharge paperwork

## 2016-04-25 ENCOUNTER — Emergency Department (HOSPITAL_BASED_OUTPATIENT_CLINIC_OR_DEPARTMENT_OTHER)
Admission: EM | Admit: 2016-04-25 | Discharge: 2016-04-25 | Disposition: A | Discharge status: Home / Self Care | Payer: Self-pay | Attending: Emergency Medicine | Admitting: Emergency Medicine

## 2016-04-25 ENCOUNTER — Encounter (HOSPITAL_BASED_OUTPATIENT_CLINIC_OR_DEPARTMENT_OTHER): Payer: Self-pay | Admitting: *Deleted

## 2016-04-25 DIAGNOSIS — Z0001 Encounter for general adult medical examination with abnormal findings: Secondary | ICD-10-CM | POA: Insufficient documentation

## 2016-04-25 DIAGNOSIS — Z7982 Long term (current) use of aspirin: Secondary | ICD-10-CM | POA: Insufficient documentation

## 2016-04-25 DIAGNOSIS — Z139 Encounter for screening, unspecified: Secondary | ICD-10-CM

## 2016-04-25 NOTE — Discharge Instructions (Signed)
Control your weight, avoid salt, exercise 20 minutes per day to maintain healthy blood pressures.  Her blood pressures today are normal, actually low normal, therefore no indication for blood pressure medication or testing at this time

## 2016-04-25 NOTE — ED Triage Notes (Signed)
Pt reports HTN and bilateral eye watering x several months.

## 2016-04-25 NOTE — ED Provider Notes (Signed)
[facility]   CSN: [id number] Arrival date & time: 04/25/16  1641   By signing my name below, I, Soijett Blue, attest that this documentation has been prepared under the direction and in the presence of Rolland PorterMark Trudi Morgenthaler, MD. Electronically Signed: Soijett Blue, ED Scribe. 04/25/16. 7:18 PM.  History   Chief Complaint Chief Complaint  Patient presents with  . Hypertension    HPI Edward Weaver is a 61 y.o. male who presents to the Emergency Department complaining of elevated blood pressure onset 1-2 weeks ago. Pt reports associated frontal HA and bilateral eye redness x 2-3 days ago. Pt hasn't tried any medications for the relief of his symptoms. Pt notes that he checks his blood pressure at various drugstores and it intermittently reads as slightly elevated. Pt states that he hasn't had a hx of HTN or had to take HTN medications. Pt notes that he also came into the ED for evaluation of erectile dysfunction onset 2 months ago. He denies weight change and any other symptoms. Denies smoking cigarettes, but notes that he does consume ETOH.   The history is provided by the patient. No language interpreter was used.    Past Medical History:  Diagnosis Date  . Arthritis   . Bell's palsy   . Edema   . Hiatal hernia   . Stroke (HCC)   . TIA (transient ischemic attack)    Nov and/or Dec 2014    Patient Active Problem List   Diagnosis Date Noted  . Anxiety 08/02/2013  . Right ankle pain 08/02/2013  . TIA (transient ischemic attack) 08/02/2013  . Visit for preventive health examination 07/24/2013  . OA (osteoarthritis) of knee 07/24/2013  . Venous insufficiency of right lower extremity 07/24/2013    Past Surgical History:  Procedure Laterality Date  . ESOPHAGUS SURGERY    . KNEE SURGERY Right    1974  . TONSILLECTOMY    . WISDOM TOOTH EXTRACTION         Home Medications    Prior to Admission medications   Medication Sig Start Date End Date Taking?  Authorizing Provider  artificial tears (LACRILUBE) OINT ophthalmic ointment Place into the left eye every hour while awake. 06/28/14   Pricilla LovelessScott Goldston, MD  aspirin 325 MG tablet Take 325 mg by mouth daily.    Historical Provider, MD  clindamycin (CLEOCIN) 150 MG capsule Take 2 capsules (300 mg total) by mouth every 6 (six) hours. 09/22/15   Renne CriglerJoshua Geiple, PA-C  furosemide (LASIX) 20 MG tablet Take 1 tablet (20 mg total) by mouth daily. 12/09/13 12/12/13  Arby BarretteMarcy Pfeiffer, MD  ibuprofen (ADVIL,MOTRIN) 400 MG tablet Take 1 tablet (400 mg total) by mouth every 6 (six) hours as needed. 05/24/15   Joycie PeekBenjamin Cartner, PA-C    Family History Family History  Problem Relation Age of Onset  . Hypertension Mother     Living  . Cancer - Other Mother     Oral  . Hypertension Father   . Alzheimer's disease Father     Living  . Hypertension Brother   . Stroke Brother [id number]    Deceased  . Alzheimer's disease Paternal Aunt   . Stroke Paternal Aunt     #1  . Dementia Paternal Aunt     #2  . Healthy Brother     x1  . Healthy Son     x3    Social History Social History  Substance Use Topics  . Smoking status: Never Smoker  .  Smokeless tobacco: Never Used  . Alcohol use No     Allergies   Penicillins   Review of Systems Review of Systems  Constitutional: Negative for appetite change, chills, diaphoresis, fatigue, fever and unexpected weight change.  HENT: Negative for mouth sores, sore throat and trouble swallowing.   Eyes: Positive for redness (bilateral eyes). Negative for visual disturbance.  Respiratory: Negative for cough, chest tightness, shortness of breath and wheezing.   Cardiovascular: Negative for chest pain.  Gastrointestinal: Negative for abdominal distention, abdominal pain, diarrhea, nausea and vomiting.  Endocrine: Negative for polydipsia, polyphagia and polyuria.  Genitourinary: Negative for dysuria, frequency and hematuria.  Musculoskeletal: Negative for gait problem.  Skin:  Negative for color change, pallor and rash.  Neurological: Positive for headaches (frontal). Negative for dizziness, syncope and light-headedness.  Hematological: Does not bruise/bleed easily.  Psychiatric/Behavioral: Negative for behavioral problems and confusion.     Physical Exam Updated Vital Signs BP 138/95 (BP Location: Right Arm)   Pulse 106   Temp 98.5 F (36.9 C) (Oral)   Resp 20   Ht 5\' 6"  (1.676 m)   Wt 260 lb (117.9 kg)   SpO2 97%   BMI 41.97 kg/m   Physical Exam  Constitutional: He is oriented to person, place, and time. He appears well-developed and well-nourished. No distress.  HENT:  Head: Normocephalic.  Eyes: Conjunctivae are normal. Pupils are equal, round, and reactive to light. No scleral icterus.  Neck: Normal range of motion. Neck supple. No thyromegaly present.  Cardiovascular: Normal rate, regular rhythm and normal heart sounds.  Exam reveals no gallop and no friction rub.   No murmur heard. BP on right arm was 111/73.  Pulmonary/Chest: Effort normal and breath sounds normal. No respiratory distress. He has no wheezes. He has no rales.  Abdominal: Soft. Bowel sounds are normal. He exhibits no distension. There is no tenderness. There is no rebound.  Musculoskeletal: Normal range of motion.  Neurological: He is alert and oriented to person, place, and time.  Skin: Skin is warm and dry. No rash noted.  Psychiatric: He has a normal mood and affect. His behavior is normal.  Nursing note and vitals reviewed.    ED Treatments / Results  DIAGNOSTIC STUDIES: Oxygen Saturation is 97% on RA, nl by my interpretation.    COORDINATION OF CARE: 7:14 PM Discussed treatment plan with pt at bedside and pt agreed to plan.   Procedures Procedures (including critical care time)  Medications Ordered in ED Medications - No data to display   Initial Impression / Assessment and Plan / ED Course  I have reviewed the triage vital signs and the nursing  notes.  Patient blood pressures that he describes at the pharmacy are normal. He was normal triage. He is low-normal in the room. Ultimately he states that he is here hoping to "get something for erectile dysfunction. Informed him that the ER as an inappropriate place for this and that he should seek out primary care physician for this.  Final Clinical Impressions(s) / ED Diagnoses   Final diagnoses:  Encounter for medical screening examination    New Prescriptions New Prescriptions   No medications on file   I personally performed the services described in this documentation, which was scribed in my presence. The recorded information has been reviewed and is accurate. statement}   Rolland Porter, MD 04/25/16 [id number]

## 2017-01-10 ENCOUNTER — Encounter (HOSPITAL_BASED_OUTPATIENT_CLINIC_OR_DEPARTMENT_OTHER): Payer: Self-pay | Admitting: Emergency Medicine

## 2017-01-10 ENCOUNTER — Observation Stay (HOSPITAL_BASED_OUTPATIENT_CLINIC_OR_DEPARTMENT_OTHER)
Admission: EM | Admit: 2017-01-10 | Discharge: 2017-01-11 | Disposition: A | Discharge status: Home / Self Care | Payer: Self-pay | Attending: Internal Medicine | Admitting: Internal Medicine

## 2017-01-10 ENCOUNTER — Observation Stay (HOSPITAL_COMMUNITY): Payer: Self-pay

## 2017-01-10 ENCOUNTER — Emergency Department (HOSPITAL_BASED_OUTPATIENT_CLINIC_OR_DEPARTMENT_OTHER): Payer: Self-pay

## 2017-01-10 DIAGNOSIS — G459 Transient cerebral ischemic attack, unspecified: Secondary | ICD-10-CM | POA: Diagnosis present

## 2017-01-10 DIAGNOSIS — R0609 Other forms of dyspnea: Secondary | ICD-10-CM | POA: Insufficient documentation

## 2017-01-10 DIAGNOSIS — Z79899 Other long term (current) drug therapy: Secondary | ICD-10-CM | POA: Insufficient documentation

## 2017-01-10 DIAGNOSIS — R0601 Orthopnea: Secondary | ICD-10-CM | POA: Insufficient documentation

## 2017-01-10 DIAGNOSIS — I10 Essential (primary) hypertension: Secondary | ICD-10-CM | POA: Insufficient documentation

## 2017-01-10 DIAGNOSIS — I4891 Unspecified atrial fibrillation: Principal | ICD-10-CM | POA: Diagnosis present

## 2017-01-10 DIAGNOSIS — E669 Obesity, unspecified: Secondary | ICD-10-CM | POA: Diagnosis present

## 2017-01-10 DIAGNOSIS — Z7982 Long term (current) use of aspirin: Secondary | ICD-10-CM | POA: Insufficient documentation

## 2017-01-10 HISTORY — DX: Essential (primary) hypertension: I10

## 2017-01-10 LAB — CBC WITH DIFFERENTIAL/PLATELET
Basophils Absolute: 0.1 10*3/uL (ref 0.0–0.1)
Basophils Relative: 1 %
EOS ABS: 0.3 10*3/uL (ref 0.0–0.7)
EOS PCT: 5 %
HCT: 41.9 % (ref 39.0–52.0)
Hemoglobin: 14.3 g/dL (ref 13.0–17.0)
LYMPHS ABS: 2.2 10*3/uL (ref 0.7–4.0)
LYMPHS PCT: 35 %
MCH: 29.4 pg (ref 26.0–34.0)
MCHC: 34.1 g/dL (ref 30.0–36.0)
MCV: 86.2 fL (ref 78.0–100.0)
MONO ABS: 0.5 10*3/uL (ref 0.1–1.0)
Monocytes Relative: 7 %
Neutro Abs: 3.4 10*3/uL (ref 1.7–7.7)
Neutrophils Relative %: 52 %
PLATELETS: 282 10*3/uL (ref 150–400)
RBC: 4.86 MIL/uL (ref 4.22–5.81)
RDW: 12.9 % (ref 11.5–15.5)
WBC: 6.5 10*3/uL (ref 4.0–10.5)

## 2017-01-10 LAB — URINALYSIS, ROUTINE W REFLEX MICROSCOPIC
Bilirubin Urine: NEGATIVE
GLUCOSE, UA: NEGATIVE mg/dL
HGB URINE DIPSTICK: NEGATIVE
Ketones, ur: NEGATIVE mg/dL
Leukocytes, UA: NEGATIVE
Nitrite: NEGATIVE
PROTEIN: NEGATIVE mg/dL
Specific Gravity, Urine: 1.025 (ref 1.005–1.030)
pH: 6 (ref 5.0–8.0)

## 2017-01-10 LAB — HEMOGLOBIN A1C
Hgb A1c MFr Bld: 5.9 % — ABNORMAL HIGH (ref 4.8–5.6)
Hgb A1c MFr Bld: 6 % — ABNORMAL HIGH (ref 4.8–5.6)
MEAN PLASMA GLUCOSE: 125.5 mg/dL
Mean Plasma Glucose: 122.63 mg/dL

## 2017-01-10 LAB — COMPREHENSIVE METABOLIC PANEL
ALK PHOS: 52 U/L (ref 38–126)
ALT: 27 U/L (ref 17–63)
ANION GAP: 7 (ref 5–15)
AST: 20 U/L (ref 15–41)
Albumin: 3.7 g/dL (ref 3.5–5.0)
BILIRUBIN TOTAL: 0.6 mg/dL (ref 0.3–1.2)
BUN: 17 mg/dL (ref 6–20)
CALCIUM: 9.1 mg/dL (ref 8.9–10.3)
CO2: 25 mmol/L (ref 22–32)
CREATININE: 1.1 mg/dL (ref 0.61–1.24)
Chloride: 106 mmol/L (ref 101–111)
GFR calc non Af Amer: >60 mL/min (ref >60)
GLUCOSE: 109 mg/dL — AB (ref 65–99)
Potassium: 4.2 mmol/L (ref 3.5–5.1)
SODIUM: 138 mmol/L (ref 135–145)
TOTAL PROTEIN: 7.2 g/dL (ref 6.5–8.1)

## 2017-01-10 LAB — TROPONIN I

## 2017-01-10 LAB — TSH
TSH: 1.449 u[IU]/mL (ref 0.350–4.500)
TSH: 1.385 u[IU]/mL (ref 0.350–4.500)

## 2017-01-10 LAB — MAGNESIUM: Magnesium: 2.1 mg/dL (ref 1.7–2.4)

## 2017-01-10 LAB — BRAIN NATRIURETIC PEPTIDE: B Natriuretic Peptide: 120.8 pg/mL — ABNORMAL HIGH (ref 0.0–100.0)

## 2017-01-10 MED ORDER — ASPIRIN EC 81 MG PO TBEC: 81.0000 mg | DELAYED_RELEASE_TABLET | Freq: Every day | ORAL | Status: DC

## 2017-01-10 MED ORDER — SENNA 8.6 MG PO TABS
1.0000 | ORAL_TABLET | Freq: Two times a day (BID) | ORAL | Status: DC
  Filled 2017-01-10: qty 1

## 2017-01-10 MED ORDER — ALBUTEROL SULFATE (2.5 MG/3ML) 0.083% IN NEBU: 2.5000 mg | INHALATION_SOLUTION | RESPIRATORY_TRACT | Status: DC | PRN

## 2017-01-10 MED ORDER — POLYETHYLENE GLYCOL 3350 17 G PO PACK: 17.0000 g | PACK | Freq: Every day | ORAL | Status: DC | PRN

## 2017-01-10 MED ORDER — SODIUM CHLORIDE 0.9% FLUSH: 3.0000 mL | INTRAVENOUS | Status: DC | PRN

## 2017-01-10 MED ORDER — RIVAROXABAN 20 MG PO TABS
20.0000 mg | ORAL_TABLET | Freq: Every day | ORAL | Status: DC
  Administered 2017-01-11: 20 mg via ORAL
  Filled 2017-01-10: qty 1

## 2017-01-10 MED ORDER — SODIUM CHLORIDE 0.9 % IV SOLN: 250.0000 mL | INTRAVENOUS | Status: DC | PRN

## 2017-01-10 MED ORDER — TRAZODONE HCL 50 MG PO TABS: 50.0000 mg | ORAL_TABLET | Freq: Every evening | ORAL | Status: DC | PRN

## 2017-01-10 MED ORDER — RIVAROXABAN 20 MG PO TABS
20.0000 mg | ORAL_TABLET | Freq: Once | ORAL | Status: DC
  Filled 2017-01-10: qty 1

## 2017-01-10 MED ORDER — ACETAMINOPHEN 650 MG RE SUPP: 650.0000 mg | Freq: Four times a day (QID) | RECTAL | Status: DC | PRN

## 2017-01-10 MED ORDER — ACETAMINOPHEN 325 MG PO TABS
650.0000 mg | ORAL_TABLET | Freq: Four times a day (QID) | ORAL | Status: DC | PRN
  Administered 2017-01-10: 650 mg via ORAL
  Filled 2017-01-10: qty 2

## 2017-01-10 MED ORDER — ASPIRIN EC 81 MG PO TBEC
81.0000 mg | DELAYED_RELEASE_TABLET | Freq: Once | ORAL | Status: AC
  Administered 2017-01-10: 81 mg via ORAL
  Filled 2017-01-10: qty 1

## 2017-01-10 MED ORDER — RIVAROXABAN (XARELTO) EDUCATION KIT FOR AFIB PATIENTS
PACK | Freq: Once | Status: AC
  Administered 2017-01-10: 19:00:00
  Filled 2017-01-10 (×2): qty 1

## 2017-01-10 MED ORDER — OFF THE BEAT BOOK
Freq: Once | Status: AC
Start: 2017-01-10 — End: 2017-01-10
  Administered 2017-01-10: 17:00:00
  Filled 2017-01-10: qty 1

## 2017-01-10 MED ORDER — ONDANSETRON HCL 4 MG/2ML IJ SOLN: 4.0000 mg | Freq: Four times a day (QID) | INTRAMUSCULAR | Status: DC | PRN

## 2017-01-10 MED ORDER — LABETALOL HCL 5 MG/ML IV SOLN: 10.0000 mg | INTRAVENOUS | Status: DC | PRN

## 2017-01-10 MED ORDER — SODIUM CHLORIDE 0.9% FLUSH
3.0000 mL | Freq: Two times a day (BID) | INTRAVENOUS | Status: DC
  Administered 2017-01-10: 3 mL via INTRAVENOUS

## 2017-01-10 MED ORDER — ONDANSETRON HCL 4 MG PO TABS: 4.0000 mg | ORAL_TABLET | Freq: Four times a day (QID) | ORAL | Status: DC | PRN

## 2017-01-10 MED ORDER — HEPARIN SODIUM (PORCINE) 5000 UNIT/ML IJ SOLN: 5000.0000 [IU] | Freq: Three times a day (TID) | INTRAMUSCULAR | Status: DC

## 2017-01-10 NOTE — ED Notes (Signed)
[facility] has left with patient at this time. 

## 2017-01-10 NOTE — ED Provider Notes (Signed)
[facility] EMERGENCY DEPARTMENT Provider Note   CSN: [id number] Arrival date & time: 01/10/17  [id number]     History   Chief Complaint Chief Complaint  Patient presents with  . Cough    HPI Edward Weaver is a 61 y.o. male.  HPI Having problems with shortness of breath and cough paroxysms for more than 3 weeks.  He reports that symptoms get pretty bad at night.  He cannot lie flat.  He wakes up short of breath and has to get in front of the window for an air conditioner.  His wife reports that at the end of the day his legs are very swollen.  He is having exertional dyspnea.  His wife reports if he walks a short distance he starts panting.  She denies chest pain.  He reports he does however have problems with if he starts coughing, he gets very short of breath and worn out.  He reports it does not feel like a cold or a pneumonia though.  No fever.  No sputum production.  No abdominal pain.  Patient denies any known history of cardiac disease.  No smoking history.  Patient has been told several times that he has high blood pressure and needs to be treated.  He has not had insurance and has not had routine medical care.  Patient reports [facility]  He denies he has any knowledge of ever having had atrial fibrillation.  As we discussed this he does report about 2 weeks ago he felt like the left side of his mouth drew up and does not work right.  He reports he is gotten used to it now.  He reports he has a distant history of Bell's palsy but this left-sided change in his mouth was not present until 2 weeks ago. Past Medical History:  Diagnosis Date  . Arthritis   . Bell's palsy   . Edema   . Hiatal hernia   . Hypertension   . Stroke (HCC)   . TIA (transient ischemic attack)    Nov and/or Dec 2014    Patient Active Problem List   Diagnosis Date Noted  . Anxiety 08/02/2013  . Right ankle pain 08/02/2013  . TIA (transient ischemic  attack) 08/02/2013  . Visit for preventive health examination 07/24/2013  . OA (osteoarthritis) of knee 07/24/2013  . Venous insufficiency of right lower extremity 07/24/2013    Past Surgical History:  Procedure Laterality Date  . ESOPHAGUS SURGERY    . KNEE SURGERY Right    1974  . TONSILLECTOMY    . WISDOM TOOTH EXTRACTION         Home Medications    Prior to Admission medications   Medication Sig Start Date End Date Taking? Authorizing Provider  artificial tears (LACRILUBE) OINT ophthalmic ointment Place into the left eye every hour while awake. 06/28/14   Pricilla LovelessGoldston, Scott, MD  aspirin 325 MG tablet Take 325 mg by mouth daily.    [provider]  clindamycin (CLEOCIN) 150 MG capsule Take 2 capsules (300 mg total) by mouth every 6 (six) hours. 09/22/15   Renne CriglerGeiple, Joshua, PA-C  furosemide (LASIX) 20 MG tablet Take 1 tablet (20 mg total) by mouth daily. 12/09/13 12/12/13  Arby BarrettePfeiffer, Darina Hartwell, MD  ibuprofen (ADVIL,MOTRIN) 400 MG tablet Take 1 tablet (400 mg total) by mouth every 6 (six) hours as needed. 05/24/15   Joycie Peekartner, Benjamin, PA-C    Family History Family  History  Problem Relation Age of Onset  . Hypertension Mother        Living  . Cancer - Other Mother        Oral  . Hypertension Father   . Alzheimer's disease Father        Living  . Hypertension Brother   . Stroke Brother 18       Deceased  . Alzheimer's disease Paternal Aunt   . Stroke Paternal Aunt        #1  . Dementia Paternal Aunt        #2  . Healthy Brother        x1  . Healthy Son        x3    Social History Social History  Substance Use Topics  . Smoking status: Never Smoker  . Smokeless tobacco: Never Used  . Alcohol use No     Allergies   Penicillins   Review of Systems Review of Systems  10 Systems reviewed and are negative for acute change except as noted in the HPI.  Physical Exam Updated Vital Signs BP 130/76 (BP Location: Right Arm)   Pulse 64   Temp 98 F (36.7 C)  (Oral)   Resp 20   Ht 5\' 6"  (1.676 m)   Wt 117.9 kg (260 lb)   SpO2 96%   BMI 41.97 kg/m   Physical Exam  Constitutional: He is oriented to person, place, and time.  Alert nontoxic.  No respiratory distress at rest.  Obesity and deconditioning.  HENT:  Head: Normocephalic and atraumatic.  Mouth/Throat: Oropharynx is clear and moist.  Eyes: EOM are normal.  Neck: Neck supple.  Cardiovascular:  Bradycardia.  No gross rub murmur gallop.  Pulmonary/Chest:  No respiratory distress at rest.  Grossly clear without wheeze rhonchi rales  Abdominal: Soft. He exhibits no distension. There is no tenderness.  Musculoskeletal:  Trace pitting edema bilateral lower extremities.  Calves soft nontender.  Neurological: He is alert and oriented to person, place, and time. A cranial nerve deficit is present. He exhibits normal muscle tone. Coordination normal.  With grimace, left side of mouth does not draw back evenly. B/L upper extremity motor strength 5\5 B/L lower extremity motor strength 5\5.  Skin: Skin is warm and dry.  Psychiatric: He has a normal mood and affect.      ED Treatments / Results  Labs (all labs ordered are listed, but only abnormal results are displayed) Labs Reviewed  COMPREHENSIVE METABOLIC PANEL - Abnormal; Notable for the following:       Result Value   Glucose, Bld 109 (*)    All other components within normal limits  BRAIN NATRIURETIC PEPTIDE - Abnormal; Notable for the following:    B Natriuretic Peptide 120.8 (*)    All other components within normal limits  TROPONIN I  CBC WITH DIFFERENTIAL/PLATELET  URINALYSIS, ROUTINE W REFLEX MICROSCOPIC    EKG  EKG Interpretation  Date/Time:  Sunday January 10 2017 11:15:41 EDT Ventricular Rate:  66 PR Interval:    QRS Duration: 112 QT Interval:  406 QTC Calculation: 426 R Axis:   82 Text Interpretation:  Atrial fibrillation Borderline intraventricular conduction delay agree.no acute ischemic appearance. Afib  new compared to previious Confirmed by Arby Barrette [id number]) on 01/10/2017 1:30:21 PM       Radiology Dg Chest 2 View  Result Date: 01/10/2017 CLINICAL DATA:  Pt states " When I have exertion , I start coughing and then I get starved  for air" . Dry, cough . Symptoms x 3 weeks, denies chest pain EXAM: CHEST - 2 VIEW COMPARISON:  03/30/2016 FINDINGS: Lungs are clear. Heart size and mediastinal contours are within normal limits. Tortuous thoracic aorta. No effusion.  No pneumothorax. Visualized bones unremarkable. IMPRESSION: No acute cardiopulmonary disease. Electronically Signed   By: Corlis Leak M.D.   On: 01/10/2017 10:22    Procedures Procedures (including critical care time)  Medications Ordered in ED Medications  rivaroxaban (XARELTO) tablet 20 mg (not administered)     Initial Impression / Assessment and Plan / ED Course  I have reviewed the triage vital signs and the nursing notes.  Pertinent labs & imaging results that were available during my care of the patient were reviewed by me and considered in my medical decision making (see chart for details).    Consult:[facility] hospitalist for admission  Final Clinical Impressions(s) / ED Diagnoses   Final diagnoses:  New onset atrial fibrillation (HCC)  TIA (transient ischemic attack)  Orthopnea  Dyspnea on exertion   Patient has new onset atrial fibrillation.  Presenting symptoms were dyspnea on exertion and orthopnea.  Significant concern is patient's history of TIA 2 years ago and now reportedly about 2 weeks ago getting weakness and droop of the left side of the mouth which persisted.  High risk for CVA with untreated atrial fibrillation.  Also symptoms are suggestive of possible cardiac ischemia with exertional dyspnea and undue fatigue with any activity.  Admit patient for further evaluation and treatment of new onset atrial fibrillation with high risk for CVA and cardiac ischemic disease.  New Prescriptions New  Prescriptions   No medications on file     Arby Barrette, MD 01/10/17 [phone]

## 2017-01-10 NOTE — ED Triage Notes (Signed)
"   When I have exertion , I start coughing and then I get starved for air" . Dry, cough . Symptoms x 3 weeks, denies chest pain

## 2017-01-10 NOTE — Progress Notes (Signed)
Pt reluctant to start Xarelto.  Dr. Denton Brick ([facility]) paged who spoke to pt over phone.  Will hold today's dose of Xarelto and give pt baby Aspirin.  Xarelto pt kit from Pharmacy and Xarelto info sheet given to pt.  Family at bedside.

## 2017-01-10 NOTE — ED Notes (Signed)
[facility] here at this time 

## 2017-01-10 NOTE — H&P (Signed)
Patient Demographics:    Edward Weaver, is a 61 y.o. male  MRN: [id number]   DOB - [birth year]  Admit Date - 01/10/2017  Outpatient Primary MD for the patient is Patient, No Pcp Per   Assessment & Plan:    Principal Problem:   A-fib (HCC) Active Problems:   TIA (transient ischemic attack)   Obesity  1)New onset Atrial Fibrillation-cha2ds2 vascular score is 3 (history of TIA and hypertension), which corresponds to annual stroke risk of 3.2% and a 4.6% risk of Stroke/TIA/Systemic Embolism, HAS-BLED score is 0 (zero), risk versus benefit of anticoagulation discussed with patient and son at bedside, patient is agreeable to take Xarelto 20 mg daily for stroke prophylaxis, echocardiogram pending, TSH, hemoglobin A1c and serum magnesium are pending.  Patient's rate is currently controlled for the most part  2)TiA- ???  Recurrent, patient states he was diagnosed with a TIA in 2014 at [facility], has had intermittent episodes of left-sided facial numbness about the last week or so, TIA/stroke workup in progress including CT head, echocardiogram, carotid artery Dopplers,  A1c, and fasting lipid profile.  Xarelto 20 mg daily for stroke prophylaxis as above #1  3)HTN-blood pressure at this time is not terribly high , patient with history of hypertension, does not take medications for blood pressure usually, if BP control agent is  needed consider beta-blockers if echo shows low EF, otherwise may use Cardizem (if EF is WNL) to improve rate control and also help his BP  4)Obesity-suspect the patient may have metabolic syndrome, A1c, lipid profile pending, lifestyle and dietary modifications discussed with patient  5)DOE/Dyspnea/Orthopnea-  suspect it is related to #1 above, echocardiogram pending, sedentary lifestyle and body  habitus might be contributory, actively no significant adventitious sounds, chest x-ray without acute findings, BNP 120  6)Social- social work/case manager consult to help patient with prescription assistance with Xarelto coupons/discount cards   With History of - Reviewed by me  Past Medical History:  Diagnosis Date  . Arthritis   . Bell's palsy   . Edema   . Hiatal hernia   . Hypertension   . Stroke (HCC)   . TIA (transient ischemic attack)    Nov and/or Dec 2014      Past Surgical History:  Procedure Laterality Date  . ESOPHAGUS SURGERY    . KNEE SURGERY Right    1974  . TONSILLECTOMY    . WISDOM TOOTH EXTRACTION      Chief Complaint  Patient presents with  . Cough      HPI:    Edward Weaver  is a 61 y.o. male with past medical history relevant for history of hypertension but does not take any medications for this as well as obesity and history of recurrent TIAs who was transferred from [facility] in [facility] with due to new onset atrial fibrillation associated with shortness of breath and dyspnea at rest and with exertion.  No  leg pain no leg swelling no pleuritic symptoms.  No frank chest pains  Over the last week patient has had intermittent episodes of left-sided numbness and twitching raising concerns about possible TIA, he was diagnosed with a TIA in November 2014 at [facility] in [place].   Patient is a non-smoker, but he has had some cough over the last week or so, no fevers and no sick contacts at home no vomiting or diarrhea  [facility] is found to be in atrial fibrillation which is new for patient.  ED provider initiated Xarelto    Review of systems:    In addition to the HPI above,   A full 12 point Review of 10 Systems was done, except as stated above, all other Review of 10 Systems were negative.    Social History:  Reviewed by me    Social History  Substance Use Topics  . Smoking  status: Never Smoker  . Smokeless tobacco: Never Used  . Alcohol use No    Family History :  Reviewed by me   Family History  Problem Relation Age of Onset  . Hypertension Mother        Living  . Cancer - Other Mother        Oral  . Hypertension Father   . Alzheimer's disease Father        Living  . Hypertension Brother   . Stroke Brother 76       Deceased  . Alzheimer's disease Paternal Aunt   . Stroke Paternal Aunt        #1  . Dementia Paternal Aunt        #2  . Healthy Brother        x1  . Healthy Son        x3    Home Medications:   Prior to Admission medications   Medication Sig Start Date End Date Taking? Authorizing Provider  artificial tears (LACRILUBE) OINT ophthalmic ointment Place into the left eye every hour while awake. 06/28/14   Pricilla Loveless, MD  aspirin 325 MG tablet Take 325 mg by mouth daily.    [provider]  clindamycin (CLEOCIN) 150 MG capsule Take 2 capsules (300 mg total) by mouth every 6 (six) hours. 09/22/15   Renne Crigler, PA-C  furosemide (LASIX) 20 MG tablet Take 1 tablet (20 mg total) by mouth daily. 12/09/13 12/12/13  Arby Barrette, MD  ibuprofen (ADVIL,MOTRIN) 400 MG tablet Take 1 tablet (400 mg total) by mouth every 6 (six) hours as needed. 05/24/15   [name], PA-C     Allergies:     Allergies  Allergen Reactions  . Penicillins Other (See Comments)    Syncope, hypotension     Physical Exam:   Vitals  Blood pressure 139/86, pulse 63, temperature 97.6 F (36.4 C), temperature source Oral, resp. rate 15, height 5\' 6"  (1.676 m), weight 118.8 kg (262 lb), SpO2 98 %.  Physical Examination: General appearance - alert, obese appearing, and in no distress Mental status - alert, oriented to person, place, and time Eyes - sclera anicteric Neck - supple, no JVD elevation , Chest -diminished in bases without significant wheezes or rales  heart - S1 and S2 normal, irregularly irregular, heart rate 68 Abdomen -  soft, nontender, nondistended, no masses or organomegaly Neurological - screening mental status exam normal, neck supple without rigidity, cranial nerves II through XII intact, DTR's normal and symmetric Extremities -  no pedal edema noted, intact peripheral pulses  Skin - warm, dry    Data Review:    CBC  Recent Labs Lab 01/10/17 1103  WBC 6.5  HGB 14.3  HCT 41.9  PLT 282  MCV 86.2  MCH 29.4  MCHC 34.1  RDW 12.9  LYMPHSABS 2.2  MONOABS 0.5  EOSABS 0.3  BASOSABS 0.1   ------------------------------------------------------------------------------------------------------------------  Chemistries   Recent Labs Lab 01/10/17 1103  NA 138  K 4.2  CL 106  CO2 25  GLUCOSE 109*  BUN 17  CREATININE 1.10  CALCIUM 9.1  AST 20  ALT 27  ALKPHOS 52  BILITOT 0.6   ------------------------------------------------------------------------------------------------------------------ estimated creatinine clearance is 85.6 mL/min (by C-G formula based on SCr of 1.1 mg/dL). ------------------------------------------------------------------------------------------------------------------ No results for input(s): TSH, T4TOTAL, T3FREE, THYROIDAB in the last 72 hours.  Invalid input(s): FREET3   Coagulation profile No results for input(s): INR, PROTIME in the last 168 hours. ------------------------------------------------------------------------------------------------------------------- No results for input(s): DDIMER in the last 72 hours. -------------------------------------------------------------------------------------------------------------------  Cardiac Enzymes  Recent Labs Lab 01/10/17 1103  TROPONINI <0.03   ------------------------------------------------------------------------------------------------------------------    Component Value Date/Time   BNP 120.8 (H) 01/10/2017 1103    ---------------------------------------------------------------------------------------------------------------  Urinalysis    Component Value Date/Time   COLORURINE YELLOW 01/10/2017 1116   APPEARANCEUR CLEAR 01/10/2017 1116   LABSPEC 1.025 01/10/2017 1116   PHURINE 6.0 01/10/2017 1116   GLUCOSEU NEGATIVE 01/10/2017 1116   HGBUR NEGATIVE 01/10/2017 1116   BILIRUBINUR NEGATIVE 01/10/2017 1116   KETONESUR NEGATIVE 01/10/2017 1116   PROTEINUR NEGATIVE 01/10/2017 1116   UROBILINOGEN 0.2 12/11/2013 1735   NITRITE NEGATIVE 01/10/2017 1116   LEUKOCYTESUR NEGATIVE 01/10/2017 1116    ----------------------------------------------------------------------------------------------------------------   Imaging Results:    Dg Chest 2 View  Result Date: 01/10/2017 CLINICAL DATA:  Pt states " When I have exertion , I start coughing and then I get starved for air" . Dry, cough . Symptoms x 3 weeks, denies chest pain EXAM: CHEST - 2 VIEW COMPARISON:  03/30/2016 FINDINGS: Lungs are clear. Heart size and mediastinal contours are within normal limits. Tortuous thoracic aorta. No effusion.  No pneumothorax. Visualized bones unremarkable. IMPRESSION: No acute cardiopulmonary disease. Electronically Signed   By: Corlis Leak  Hassell M.D.   On: 01/10/2017 10:22    Radiological Exams on Admission: Dg Chest 2 View  Result Date: 01/10/2017 CLINICAL DATA:  Pt states " When I have exertion , I start coughing and then I get starved for air" . Dry, cough . Symptoms x 3 weeks, denies chest pain EXAM: CHEST - 2 VIEW COMPARISON:  03/30/2016 FINDINGS: Lungs are clear. Heart size and mediastinal contours are within normal limits. Tortuous thoracic aorta. No effusion.  No pneumothorax. Visualized bones unremarkable. IMPRESSION: No acute cardiopulmonary disease. Electronically Signed   By: Corlis Leak  Hassell M.D.   On: 01/10/2017 10:22    DVT Prophylaxis -SCD /Xarelto AM Labs Ordered, also please review Full Orders  Family  Communication: Admission, patients condition and plan of care including tests being ordered have been discussed with the patient and son who indicate understanding and agree with the plan   Code Status - Full Code  Likely DC to  home  Condition   stable Jaeley Wiker M.D on 01/10/2017 at 4:51 PM  Between 7am to 7pm - Pager - [phone] After 7pm go to [email or web address] - password [email or web address]  [facility] - Office  [phone]  Voice Recognition Reubin Milan/Dragon dictation system was used to create this note, attempts have been made to correct  errors. Please contact the author with questions and/or clarifications.

## 2017-01-11 ENCOUNTER — Observation Stay (HOSPITAL_BASED_OUTPATIENT_CLINIC_OR_DEPARTMENT_OTHER): Payer: Self-pay

## 2017-01-11 DIAGNOSIS — I10 Essential (primary) hypertension: Secondary | ICD-10-CM

## 2017-01-11 DIAGNOSIS — Z6841 Body Mass Index (BMI) 40.0 and over, adult: Secondary | ICD-10-CM

## 2017-01-11 DIAGNOSIS — G459 Transient cerebral ischemic attack, unspecified: Secondary | ICD-10-CM

## 2017-01-11 DIAGNOSIS — I4891 Unspecified atrial fibrillation: Secondary | ICD-10-CM

## 2017-01-11 LAB — VAS US CAROTID
LCCADDIAS: -32 cm/s
LCCAPDIAS: 31 cm/s
LCCAPSYS: 92 cm/s
LEFT ECA DIAS: -31 cm/s
LEFT VERTEBRAL DIAS: -16 cm/s
LICADDIAS: -28 cm/s
LICADSYS: -96 cm/s
LICAPSYS: 157 cm/s
Left CCA dist sys: -100 cm/s
Left ICA prox dias: 51 cm/s
RCCADSYS: -109 cm/s
RIGHT ECA DIAS: -19 cm/s
RIGHT VERTEBRAL DIAS: -8 cm/s
Right CCA prox dias: 14 cm/s
Right CCA prox sys: 69 cm/s

## 2017-01-11 LAB — BASIC METABOLIC PANEL
ANION GAP: 6 (ref 5–15)
BUN: 16 mg/dL (ref 6–20)
CALCIUM: 8.7 mg/dL — AB (ref 8.9–10.3)
CO2: 27 mmol/L (ref 22–32)
Chloride: 103 mmol/L (ref 101–111)
Creatinine, Ser: 1.2 mg/dL (ref 0.61–1.24)
GFR calc Af Amer: >60 mL/min (ref >60)
Glucose, Bld: 138 mg/dL — ABNORMAL HIGH (ref 65–99)
POTASSIUM: 4.2 mmol/L (ref 3.5–5.1)
SODIUM: 136 mmol/L (ref 135–145)

## 2017-01-11 LAB — LIPID PANEL
Cholesterol: 225 mg/dL — ABNORMAL HIGH (ref 0–200)
HDL: 41 mg/dL (ref >40)
LDL CALC: 127 mg/dL — AB (ref 0–99)
Total CHOL/HDL Ratio: 5.5 RATIO
Triglycerides: 284 mg/dL — ABNORMAL HIGH (ref <150)
VLDL: 57 mg/dL — ABNORMAL HIGH (ref 0–40)

## 2017-01-11 LAB — HIV ANTIBODY (ROUTINE TESTING W REFLEX): HIV Screen 4th Generation wRfx: NONREACTIVE

## 2017-01-11 MED ORDER — RIVAROXABAN 20 MG PO TABS: 20.0000 mg | ORAL_TABLET | Freq: Every day | ORAL | 0 refills | Status: DC

## 2017-01-11 MED ORDER — POLYETHYLENE GLYCOL 3350 17 G PO PACK: 17.0000 g | PACK | Freq: Every day | ORAL | 0 refills | Status: DC | PRN

## 2017-01-11 MED ORDER — ATORVASTATIN CALCIUM 40 MG PO TABS
40.0000 mg | ORAL_TABLET | Freq: Every day | ORAL | Status: DC
  Administered 2017-01-11: 40 mg via ORAL
  Filled 2017-01-11: qty 1

## 2017-01-11 MED ORDER — DILTIAZEM HCL ER COATED BEADS 120 MG PO CP24: 120.0000 mg | ORAL_CAPSULE | Freq: Every day | ORAL | 0 refills | Status: DC

## 2017-01-11 MED ORDER — TRAZODONE HCL 50 MG PO TABS: 50.0000 mg | ORAL_TABLET | Freq: Every evening | ORAL | 0 refills | Status: DC | PRN

## 2017-01-11 MED ORDER — ATORVASTATIN CALCIUM 40 MG PO TABS: 40.0000 mg | ORAL_TABLET | Freq: Every day | ORAL | 0 refills | Status: DC

## 2017-01-11 MED ORDER — DILTIAZEM HCL ER COATED BEADS 120 MG PO CP24
120.0000 mg | ORAL_CAPSULE | Freq: Every day | ORAL | Status: DC
  Administered 2017-01-11: 120 mg via ORAL
  Filled 2017-01-11: qty 1

## 2017-01-11 MED FILL — DILTIAZEM 24HR ER 120 MG CA: 120 | 30 days supply | Qty: 30 | Fill #0

## 2017-01-11 MED FILL — ?ATORVASTATIN 40MG TABLET: 40 | 30 days supply | Qty: 30 | Fill #0

## 2017-01-11 MED FILL — XARELTO 20 MG TABLET: 20 | 30 days supply | Qty: 30 | Fill #0

## 2017-01-11 NOTE — Progress Notes (Signed)
Pt's wife states pt doesn't want to take Xarelto because she (the wife) has experienced adverse side effects from taking Xarelto in the past.  Pt's wife also reports that Mr. Edward Weaver has c/o chest pain radiating down his left arm intermittently over the past several weeks.  She states that he gets very SOB walking even short distances.  Pt and wife encouraged to discuss these concerns with the rounding physician in the morning.  Also encouraged to notify staff if these symptoms recur while here in the hospital.  Pt denies c/o CP at this time.  Will continue to monitor.  Alonza Bogusuvall, Chau Sawin Gray

## 2017-01-11 NOTE — Progress Notes (Signed)
  Echocardiogram 2D Echocardiogram has been performed.  Edward SavoyCasey N Eleen Weaver 01/11/2017, 12:07 PM

## 2017-01-11 NOTE — Progress Notes (Signed)
Pt ambulated in hallway without difficulty. Sat's maintained 98% on RA, HR 108. Dr. Benjamine MolaVann notified. Emelda Brothershristy Caidin Heidenreich RN

## 2017-01-11 NOTE — Plan of Care (Signed)
Problem: Education: Goal: Knowledge of disease or condition will improve Outcome: Progressing Off the Beat book given.  Pt's and family's questions answered.

## 2017-01-11 NOTE — Care Management Note (Signed)
Case Management Note  Patient Details  Name: Edward Weaver MRN: [id number] Date of Birth: 03-10-1956  Subjective/Objective:     New onset afib, HTN               Action/Plan: Discharge Planning: NCM spoke to pt and wife at bedside. Pt states he does not have insurance or PCP. [facility] at 930 am. [facility]. [facility] Contacted pharmacy and they do have in stock. Completed [employer] for [employer]. Faxed application to [employer]. Provided pt [facility] to give to [place].    Expected Discharge Date: 01/11/2017                 Expected Discharge Plan:  Home/Self Care  In-House Referral:  NA  Discharge planning Services  CM Consult, Medication Assistance, Follow-up appt scheduled  Post Acute Care Choice:  NA Choice offered to:  NA  DME Arranged:  N/A DME Agency:  NA  HH Arranged:  NA HH Agency:  NA  Status of Service:  Completed, signed off  If discussed at Long Length of Stay Meetings, dates discussed:    Additional Comments:  Elliot CousinShavis, Zaylon Bossier Ellen, RN 01/11/2017, 3:08 PM

## 2017-01-11 NOTE — Discharge Instructions (Addendum)

## 2017-01-11 NOTE — Discharge Summary (Addendum)
Physician Discharge Summary  Edward Weaver:[id number] DOB: 1956-03-10 DOA: 01/10/2017  PCP: Patient, No Pcp Per  Admit date: 01/10/2017 Discharge date: 01/11/2017   Recommendations for Outpatient Follow-Up:   1. outpatient sleep study referral 2. Referral to cardiology for a fib 3. Follow up of left carotid stenosis 4. FLP and LFTS in 6 weeks- started statin   Discharge Diagnosis:   Principal Problem:   A-fib [facility]) Active Problems:   TIA (transient ischemic attack)   Obesity   Discharge disposition:  Home.  SNF:  Discharge Condition: Improved.  Diet recommendation: Low sodium, heart healthy  Wound care: None.   History of Present Illness:   Edward Weaver  is a 61 y.o. male with past medical history relevant for history of hypertension but does not take any medications for this as well as obesity and history of recurrent TIAs who was transferred from [facility] in [facility] with due to new onset atrial fibrillation associated with shortness of breath and dyspnea at rest and with exertion.  No leg pain no leg swelling no pleuritic symptoms.  No frank chest pains  Over the last week patient has had intermittent episodes of left-sided numbness and twitching raising concerns about possible TIA, he was diagnosed with a TIA in November 2014 at [facility] in [place].   Patient is a non-smoker, but he has had some cough over the last week or so, no fevers and no sick contacts at home no vomiting or diarrhea  [facility] is found to be in atrial fibrillation which is new for patient.  ED provider initiated [facility] Course by Problem:   1 Atrial Fibrillation-cha2ds2 vascular score is 3 (history of TIA and hypertension), which corresponds to annual stroke risk of 3.2% and a 4.6% risk of Stroke/TIA/Systemic Embolism, HAS-BLED score is 0 (zero), -Xarelto 20 mg daily for stroke prophylaxis -echocardiogram  done -TSH ok -hemoglobin A1c ok -Mg ok -outpatient cardiology follow up -OSA screening  2)TIA -  Recurrent, patient states he was diagnosed with a TIA in 2014 at [facility], has had intermittent episodes of left-sided facial numbness about the last week or so - Xarelto 20 mg daily for stroke prophylaxis as above #1 -added statin for LDL >70  3)HTN- added cardizem for rate control   4)Obesity-suspect the patient may have metabolic syndrome- encouraged lifestyle modifications  5)DOE/Dyspnea/Orthopnea-  suspect it is related to #1 above, echocardiogram with preserved EF, sedentary lifestyle and body habitus might be contributory, actively no significant adventitious sounds, chest x-ray without acute findings, BNP 120       Medical Consultants:    None.   Discharge Exam:   Vitals:   01/11/17 0509 01/11/17 1324  BP: 139/83 132/77  Pulse: 65 73  Resp: 18 18  Temp: 98 F (36.7 C) 98.2 F (36.8 C)  SpO2: 98% 98%   Vitals:   01/10/17 1559 01/10/17 2112 01/11/17 0509 01/11/17 1324  BP: 139/86 (!) 122/99 139/83 132/77  Pulse: 63 66 65 73  Resp: 15 16 18 18   Temp: 97.6 F (36.4 C) 98 F (36.7 C) 98 F (36.7 C) 98.2 F (36.8 C)  TempSrc: Oral Oral Oral Oral  SpO2: 98% 98% 98% 98%  Weight: 118.8 kg (262 lb)  118.6 kg (261 lb 8 oz)   Height: 5\' 6"  (1.676 m)       Gen:  NAD    The results of significant diagnostics  from this hospitalization (including imaging, microbiology, ancillary and laboratory) are listed below for reference.     Procedures and Diagnostic Studies:   Dg Chest 2 View  Result Date: 01/10/2017 CLINICAL DATA:  Pt states " When I have exertion , I start coughing and then I get starved for air" . Dry, cough . Symptoms x 3 weeks, denies chest pain EXAM: CHEST - 2 VIEW COMPARISON:  03/30/2016 FINDINGS: Lungs are clear. Heart size and mediastinal contours are within normal limits. Tortuous thoracic aorta. No effusion.  No pneumothorax.  Visualized bones unremarkable. IMPRESSION: No acute cardiopulmonary disease. Electronically Signed   By: Corlis Leak  Hassell M.D.   On: 01/10/2017 10:22   Ct Head Wo Contrast  Result Date: 01/10/2017 CLINICAL DATA:  TIA. Left-sided facial numbness for the last week or so. EXAM: CT HEAD WITHOUT CONTRAST TECHNIQUE: Contiguous axial images were obtained from the base of the skull through the vertex without intravenous contrast. COMPARISON:  02/28/2012 is most recent available. FINDINGS: Brain: No evidence of acute infarction, hemorrhage, hydrocephalus, extra-axial collection or mass lesion/mass effect. No findings along Meckel's cave in the skullbase. Vascular: No hyperdense vessel or unexpected calcification. Skull: Normal. Negative for fracture or focal lesion. Sinuses/Orbits: No acute finding. IMPRESSION: Negative head CT.  No explanation for symptoms. Electronically Signed   By: Marnee SpringJonathon  Watts M.D.   On: 01/10/2017 18:48     Labs:   Basic Metabolic Panel:  Recent Labs Lab 01/10/17 1103 01/10/17 1812 01/11/17 0531  NA 138  --  136  K 4.2  --  4.2  CL 106  --  103  CO2 25  --  27  GLUCOSE 109*  --  138*  BUN 17  --  16  CREATININE 1.10  --  1.20  CALCIUM 9.1  --  8.7*  MG  --  2.1  --    GFR Estimated Creatinine Clearance: 78.4 mL/min (by C-G formula based on SCr of 1.2 mg/dL). Liver Function Tests:  Recent Labs Lab 01/10/17 1103  AST 20  ALT 27  ALKPHOS 52  BILITOT 0.6  PROT 7.2  ALBUMIN 3.7   No results for input(s): LIPASE, AMYLASE in the last 168 hours. No results for input(s): AMMONIA in the last 168 hours. Coagulation profile No results for input(s): INR, PROTIME in the last 168 hours.  CBC:  Recent Labs Lab 01/10/17 1103  WBC 6.5  NEUTROABS 3.4  HGB 14.3  HCT 41.9  MCV 86.2  PLT 282   Cardiac Enzymes:  Recent Labs Lab 01/10/17 1103  TROPONINI <0.03   BNP: Invalid input(s): POCBNP CBG: No results for input(s): GLUCAP in the last 168  hours. D-Dimer No results for input(s): DDIMER in the last 72 hours. Hgb A1c  Recent Labs  01/10/17 1628 01/10/17 1812  HGBA1C 5.9* 6.0*   Lipid Profile  Recent Labs  01/11/17 0531  CHOL 225*  HDL 41  LDLCALC 127*  TRIG 284*  CHOLHDL 5.5   Thyroid function studies  Recent Labs  01/10/17 1812  TSH 1.385   Anemia work up No results for input(s): VITAMINB12, FOLATE, FERRITIN, TIBC, IRON, RETICCTPCT in the last 72 hours. Microbiology No results found for this or any previous visit (from the past 240 hour(s)).   Discharge Instructions:   Discharge Instructions    Diet - low sodium heart healthy    Complete by:  As directed    Discharge instructions    Complete by:  As directed    Outpatient referral to cardiology  Monitoring of left carotid stenosis- if amount of blockage increased, will need referral to vascular surgery Monitor salt intake-- see attached information about diet Wear TED hose when up during the day to help with LE swelling   Increase activity slowly    Complete by:  As directed      Allergies as of 01/11/2017      Reactions   Penicillins Other (See Comments)   Syncope, hypotension      Medication List    STOP taking these medications   ibuprofen 400 MG tablet Commonly known as:  ADVIL,MOTRIN     TAKE these medications   atorvastatin 40 MG tablet Commonly known as:  LIPITOR Take 1 tablet (40 mg total) by mouth daily at 6 PM.   diltiazem 120 MG 24 hr capsule Commonly known as:  CARDIZEM CD Take 1 capsule (120 mg total) by mouth daily.   polyethylene glycol packet Commonly known as:  MIRALAX / GLYCOLAX Take 17 g by mouth daily as needed for mild constipation.   rivaroxaban 20 MG Tabs tablet Commonly known as:  XARELTO Take 1 tablet (20 mg total) by mouth daily.   traZODone 50 MG tablet Commonly known as:  DESYREL Take 1 tablet (50 mg total) by mouth at bedtime as needed for sleep.      Follow-up Information    [facility] Follow up.   Specialty:  Internal Medicine Why:  appointment 01/29/2017 at 9:30 am please call if you can make the appointment to reschedule.  Contact information: [address] [place] [address] [phone]           Time coordinating discharge: 25 min  Signed:  Ajla Mcgeachy Juanetta Gosling   [facility] 01/11/2017, 3:29 PM

## 2017-01-11 NOTE — Progress Notes (Signed)
VASCULAR LAB PRELIMINARY  PRELIMINARY  PRELIMINARY  PRELIMINARY  Carotid duplex completed.    Please see results review for full preliminary report.  Demonica Farrey, RVT 01/11/2017, 11:44 AM

## 2017-01-18 ENCOUNTER — Emergency Department (HOSPITAL_COMMUNITY)
Admission: EM | Admit: 2017-01-18 | Discharge: 2017-01-19 | Disposition: A | Discharge status: Home / Self Care | Payer: Self-pay | Attending: Emergency Medicine | Admitting: Emergency Medicine

## 2017-01-18 ENCOUNTER — Other Ambulatory Visit: Payer: Self-pay

## 2017-01-18 ENCOUNTER — Encounter (HOSPITAL_COMMUNITY): Payer: Self-pay | Admitting: Emergency Medicine

## 2017-01-18 DIAGNOSIS — Z7901 Long term (current) use of anticoagulants: Secondary | ICD-10-CM | POA: Insufficient documentation

## 2017-01-18 DIAGNOSIS — I10 Essential (primary) hypertension: Secondary | ICD-10-CM | POA: Insufficient documentation

## 2017-01-18 DIAGNOSIS — R002 Palpitations: Secondary | ICD-10-CM

## 2017-01-18 DIAGNOSIS — Z8673 Personal history of transient ischemic attack (TIA), and cerebral infarction without residual deficits: Secondary | ICD-10-CM | POA: Insufficient documentation

## 2017-01-18 DIAGNOSIS — Z79899 Other long term (current) drug therapy: Secondary | ICD-10-CM | POA: Insufficient documentation

## 2017-01-18 DIAGNOSIS — I4891 Unspecified atrial fibrillation: Secondary | ICD-10-CM | POA: Insufficient documentation

## 2017-01-18 LAB — I-STAT TROPONIN, ED: TROPONIN I, POC: 0 ng/mL (ref 0.00–0.08)

## 2017-01-18 LAB — BASIC METABOLIC PANEL
ANION GAP: 10 (ref 5–15)
BUN: 14 mg/dL (ref 6–20)
CHLORIDE: 105 mmol/L (ref 101–111)
CO2: 22 mmol/L (ref 22–32)
Calcium: 8.9 mg/dL (ref 8.9–10.3)
Creatinine, Ser: 1.13 mg/dL (ref 0.61–1.24)
GFR calc Af Amer: >60 mL/min (ref >60)
GFR calc non Af Amer: >60 mL/min (ref >60)
Glucose, Bld: 140 mg/dL — ABNORMAL HIGH (ref 65–99)
POTASSIUM: 3.7 mmol/L (ref 3.5–5.1)
SODIUM: 137 mmol/L (ref 135–145)

## 2017-01-18 LAB — CBC
HEMATOCRIT: 41 % (ref 39.0–52.0)
HEMOGLOBIN: 13.8 g/dL (ref 13.0–17.0)
MCH: 29.6 pg (ref 26.0–34.0)
MCHC: 33.7 g/dL (ref 30.0–36.0)
MCV: 87.8 fL (ref 78.0–100.0)
Platelets: 282 10*3/uL (ref 150–400)
RBC: 4.67 MIL/uL (ref 4.22–5.81)
RDW: 12.7 % (ref 11.5–15.5)
WBC: 7.4 10*3/uL (ref 4.0–10.5)

## 2017-01-18 LAB — TROPONIN I: Troponin I: <0.03 ng/mL (ref <0.03)

## 2017-01-18 LAB — BRAIN NATRIURETIC PEPTIDE: B Natriuretic Peptide: 110.7 pg/mL — ABNORMAL HIGH (ref 0.0–100.0)

## 2017-01-18 NOTE — Discharge Instructions (Signed)
Please call your cardiologist regarding your symptoms.  You develop chest pain, nausea, vomiting, shortness of breath, feel lightheaded, or dizzy, or your palpitations become continuous or you have any other concerns please seek additional medical care and evaluation.

## 2017-01-18 NOTE — ED Provider Notes (Signed)
[facility] Provider Note   CSN: [id number] Arrival date & time: 01/18/17  1926     History   Chief Complaint No chief complaint on file.   HPI Edward DuelWayne D Weaver is a 61 y.o. male with a history of TIA, stroke, Bell's palsy, recently diagnosed with A. fib on 10/28 and discharged on 10/29 who presents today for evaluation of palpitations.  Chart review shows that he was not cardioverted and has remained in A. fib throughout his previous hospitalization.  He reports that since then he has been feeling palpitations.  He denies any chest pain or pressure.  He reports that he has been taking all of his medications as directed.  He was started on Xarelto, Lipitor, diltiazem, and trazodone.  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  He denies any increased shortness of breath.  Denies worsening leg swelling since last admission, no worsening shortness of breath since last admission.  HPI  Past Medical History:  Diagnosis Date  . Arthritis   . Bell's palsy   . Edema   . Hiatal hernia   . Hypertension   . Stroke (HCC)   . TIA (transient ischemic attack)    Nov and/or Dec 2014    Patient Active Problem List   Diagnosis Date Noted  . A-fib (HCC) 01/10/2017  . Obesity 01/10/2017  . Anxiety 08/02/2013  . Right ankle pain 08/02/2013  . TIA (transient ischemic attack) 08/02/2013  . Visit for preventive health examination 07/24/2013  . OA (osteoarthritis) of knee 07/24/2013  . Venous insufficiency of right lower extremity 07/24/2013    Past Surgical History:  Procedure Laterality Date  . ESOPHAGUS SURGERY    . KNEE SURGERY Right    1974  . TONSILLECTOMY    . WISDOM TOOTH EXTRACTION         Home Medications    Prior to Admission medications   Medication Sig Start Date End Date Taking? Authorizing Provider  atorvastatin (LIPITOR) 40  MG tablet Take 1 tablet (40 mg total) by mouth daily at 6 PM. 01/11/17   Joseph ArtVann, Jessica U, DO  diltiazem (CARDIZEM CD) 120 MG 24 hr capsule Take 1 capsule (120 mg total) by mouth daily. 01/11/17   Joseph ArtVann, Jessica U, DO  polyethylene glycol (MIRALAX / GLYCOLAX) packet Take 17 g by mouth daily as needed for mild constipation. 01/11/17   Joseph ArtVann, Jessica U, DO  rivaroxaban (XARELTO) 20 MG TABS tablet Take 1 tablet (20 mg total) by mouth daily. 01/11/17   Joseph ArtVann, Jessica U, DO  traZODone (DESYREL) 50 MG tablet Take 1 tablet (50 mg total) by mouth at bedtime as needed for sleep. 01/11/17   Joseph ArtVann, Jessica U, DO    Family History Family History  Problem Relation Age of Onset  . Hypertension Mother        Living  . Cancer - Other Mother        Oral  . Hypertension Father   . Alzheimer's disease Father        Living  . Hypertension Brother   . Stroke Brother [id number]       Deceased  . Alzheimer's disease Paternal Aunt   . Stroke Paternal Aunt        #1  . Dementia Paternal Aunt        #2  . Healthy Brother  x1  . Healthy Son        x3    Social History Social History   Tobacco Use  . Smoking status: Never Smoker  . Smokeless tobacco: Never Used  Substance Use Topics  . Alcohol use: No  . Drug use: No     Allergies   Penicillins   Review of Systems Review of Systems  Constitutional: Negative for chills, fatigue, fever and unexpected weight change.  HENT: Negative for ear pain and sore throat.   Eyes: Negative for pain and visual disturbance.  Respiratory: Negative for cough, chest tightness and shortness of breath.   Cardiovascular: Positive for palpitations. Negative for chest pain and leg swelling.  Gastrointestinal: Negative for abdominal pain and vomiting.  Genitourinary: Negative for dysuria and hematuria.  Musculoskeletal: Negative for arthralgias and back pain.  Skin: Negative for color change and rash.  Neurological: Negative for seizures, syncope, light-headedness and  headaches.  Psychiatric/Behavioral: Negative for confusion.  All other systems reviewed and are negative.    Physical Exam Updated Vital Signs BP 106/77 (BP Location: Right Arm)   Pulse 65   Temp 98.7 F (37.1 C) (Oral)   Resp 18   Ht 5\' 6"  (1.676 m)   Wt 117.9 kg (260 lb)   SpO2 98%   BMI 41.97 kg/m   Physical Exam  Constitutional: He is oriented to person, place, and time. He appears well-developed and well-nourished. No distress.  HENT:  Head: Normocephalic and atraumatic.  Mouth/Throat: Oropharynx is clear and moist.  Eyes: Conjunctivae are normal. Pupils are equal, round, and reactive to light.  Neck: Normal range of motion. Neck supple. No JVD present.  Cardiovascular: Normal rate, normal heart sounds and intact distal pulses. An irregularly irregular rhythm present. Exam reveals no friction rub.  No murmur heard. Pulmonary/Chest: Effort normal and breath sounds normal. No stridor. No respiratory distress. He has no wheezes. He exhibits no tenderness.  Abdominal: Soft. He exhibits no distension. There is no tenderness.  Musculoskeletal: He exhibits no edema.  Neurological: He is alert and oriented to person, place, and time.  Skin: Skin is warm and dry. He is not diaphoretic.  Psychiatric: He has a normal mood and affect.  Nursing note and vitals reviewed.    ED Treatments / Results  Labs (all labs ordered are listed, but only abnormal results are displayed) Labs Reviewed  BRAIN NATRIURETIC PEPTIDE - Abnormal; Notable for the following components:      Result Value   B Natriuretic Peptide 110.7 (*)    All other components within normal limits  BASIC METABOLIC PANEL - Abnormal; Notable for the following components:   Glucose, Bld 140 (*)    All other components within normal limits  CBC  TROPONIN I  I-STAT TROPONIN, ED    EKG  EKG Interpretation None     Viewed by Dr. Erma Heritage.  A-fib.   Radiology No results found.  Procedures Procedures (including  critical care time)  Medications Ordered in ED Medications - No data to display   Initial Impression / Assessment and Plan / ED Course  I have reviewed the triage vital signs and the nursing notes.  Pertinent labs & imaging results that were available during my care of the patient were reviewed by me and considered in my medical decision making (see chart for details).  Clinical Course as of Jan 20 131  Sheral Flow Jan 18, 2017  2222 Called lab regarding BMP and BNP, they said that labs were canceled and  they were never notified of the additional labs.  They will add it on.   [EH]    Clinical Course User Index [EH] Cristina Gong, PA-C   Edward Duel presents today for evaluation of palpitations.  He was diagnosed with new onset A. fib on 01/10/17.  He reports compliance with all of his medications since then, and presented today after feeling multiple palpitations in the morning to be checked out.  He does not have any chest pain, no shortness of breath.  Aside from palpitations he denies no new symptoms.  He was given rate control medication, however he was not cardioverted and is in A. fib today, I suspect that his palpitations are secondary to A. fib.  No worsening leg swelling.  EKG was obtained showing a-fib.  He did not have any palpitations in the ED.  He was observed for multiple hours.  Troponin negative, labs reassuring.  Patient given return precautions.  This patient was discussed with Dr. Erma Heritage who agreed with my plan.    At this time there does not appear to be any evidence of an acute emergency medical condition and the patient appears stable for discharge with appropriate outpatient follow up.Diagnosis was discussed with patient who verbalizes understanding and is agreeable to discharge.    Final Clinical Impressions(s) / ED Diagnoses   Final diagnoses:  Intermittent palpitations  Atrial fibrillation, unspecified type [facility])    ED Discharge Orders    None         Norman Clay 01/19/17 1610    Shaune Pollack, MD 01/19/17 1157

## 2017-01-18 NOTE — ED Notes (Signed)
PA at bedside.

## 2017-01-18 NOTE — ED Triage Notes (Signed)
[facility] with complaints of intermittent chest palpitations, occurs more with movement. No chest pain, nausea, vomiting or dizziness reported. Recently diagnosed with Afib, on Xarelto. History of Afib, HTN, and elevated cholesterol. EMS provided 324 mg of aspirin in route.

## 2017-01-19 ENCOUNTER — Telehealth (HOSPITAL_COMMUNITY): Payer: Self-pay | Admitting: *Deleted

## 2017-01-19 NOTE — Telephone Encounter (Signed)
LMOM for pt to clbk to schedule an appt since his visit to the ED

## 2017-01-27 ENCOUNTER — Ambulatory Visit (HOSPITAL_COMMUNITY)
Admission: RE | Admit: 2017-01-27 | Discharge: 2017-01-27 | Disposition: A | Discharge status: Home / Self Care | Payer: Self-pay | Source: Ambulatory Visit | Attending: Nurse Practitioner | Admitting: Nurse Practitioner

## 2017-01-27 ENCOUNTER — Encounter (HOSPITAL_COMMUNITY): Payer: Self-pay | Admitting: Nurse Practitioner

## 2017-01-27 VITALS — BP 126/84 | HR 65 | Ht 66.0 in | Wt 265.0 lb

## 2017-01-27 DIAGNOSIS — I481 Persistent atrial fibrillation: Secondary | ICD-10-CM

## 2017-01-27 DIAGNOSIS — I10 Essential (primary) hypertension: Secondary | ICD-10-CM | POA: Insufficient documentation

## 2017-01-27 DIAGNOSIS — Z8249 Family history of ischemic heart disease and other diseases of the circulatory system: Secondary | ICD-10-CM | POA: Insufficient documentation

## 2017-01-27 DIAGNOSIS — Z79899 Other long term (current) drug therapy: Secondary | ICD-10-CM | POA: Insufficient documentation

## 2017-01-27 DIAGNOSIS — Z8673 Personal history of transient ischemic attack (TIA), and cerebral infarction without residual deficits: Secondary | ICD-10-CM | POA: Insufficient documentation

## 2017-01-27 DIAGNOSIS — Z823 Family history of stroke: Secondary | ICD-10-CM | POA: Insufficient documentation

## 2017-01-27 DIAGNOSIS — I4891 Unspecified atrial fibrillation: Secondary | ICD-10-CM | POA: Insufficient documentation

## 2017-01-27 DIAGNOSIS — Z880 Allergy status to penicillin: Secondary | ICD-10-CM | POA: Insufficient documentation

## 2017-01-27 DIAGNOSIS — I4819 Other persistent atrial fibrillation: Secondary | ICD-10-CM

## 2017-01-27 DIAGNOSIS — Z6841 Body Mass Index (BMI) 40.0 and over, adult: Secondary | ICD-10-CM | POA: Insufficient documentation

## 2017-01-27 DIAGNOSIS — M1990 Unspecified osteoarthritis, unspecified site: Secondary | ICD-10-CM | POA: Insufficient documentation

## 2017-01-27 DIAGNOSIS — E669 Obesity, unspecified: Secondary | ICD-10-CM | POA: Insufficient documentation

## 2017-01-27 DIAGNOSIS — Z7901 Long term (current) use of anticoagulants: Secondary | ICD-10-CM | POA: Insufficient documentation

## 2017-01-27 NOTE — Progress Notes (Signed)
CSW referred to assist with insurance. Patient reports he has been out of work for several months due to health issues. Patient has been staying with different family members due to no income and needed support. Patient's ex wife reports he has not been to a MD in ages and now appears to have multiple problems. Patient encouraged to [facility] and apply for the [employer] Card and inquire about pending medicaid. CSW also provided information about Social Security application process and patient plans to contact to begin application process for disability. Patient and ex verbalize understanding and plan to follow up and return call to Afib clinic with outcome of [employer] card. CSW will follow up as needed pending outcome of applications. Lasandra BeechJackie Alajiah Dutkiewicz, LCSW, CCSW-MCS [phone]

## 2017-01-28 NOTE — Progress Notes (Signed)
Primary Care Physician: Patient, No Pcp Per Referring Physician: [facility]    Edward Weaver is a 61 y.o. male with a h/o obesity,htn,(untreated) previous TIA (dx 2014), carotid disease, not followed by a PCP, that [presented to the ER with new onset afib although per ex wife, he has been symptomatic with dyspnea, fatigue, swelling for months. He was started on Cardizem and xarelto . He was not cardioverted as to unknown onset of afib.   In the afib office, he is now in rate controlled afib at 65 bpm. He is feeling better.  Does not smoke or use alcohol. Does snore with apnea episodes. LLE is improving and is at trace levels today. He is unemployed  and does not have [employer] insurance. SW in to talk to pt.   Today, he denies symptoms of palpitations, chest pain, shortness of breath, orthopnea, PND, lower extremity edema, dizziness, presyncope, syncope, or neurologic sequela. The patient is tolerating medications without difficulties and is otherwise without complaint today.   Past Medical History:  Diagnosis Date  . Arthritis   . Bell's palsy   . Edema   . Hiatal hernia   . Hypertension   . Stroke (HCC)   . TIA (transient ischemic attack)    Nov and/or Dec 2014   Past Surgical History:  Procedure Laterality Date  . ESOPHAGUS SURGERY    . KNEE SURGERY Right    1974  . TONSILLECTOMY    . WISDOM TOOTH EXTRACTION      Current Outpatient Medications  Medication Sig Dispense Refill  . atorvastatin (LIPITOR) 40 MG tablet Take 1 tablet (40 mg total) by mouth daily at 6 PM. 30 tablet 0  . diltiazem (CARDIZEM CD) 120 MG 24 hr capsule Take 1 capsule (120 mg total) by mouth daily. 30 capsule 0  . polyethylene glycol (MIRALAX / GLYCOLAX) packet Take 17 g by mouth daily as needed for mild constipation. 14 each 0  . rivaroxaban (XARELTO) 20 MG TABS tablet Take 1 tablet (20 mg total) by mouth daily. 30 tablet 0  . traZODone (DESYREL) 50 MG tablet Take 1 tablet (50 mg total) by mouth at  bedtime as needed for sleep. (Patient not taking: Reported on 01/27/2017) 20 tablet 0   No current facility-administered medications for this encounter.     Allergies  Allergen Reactions  . Penicillins Other (See Comments)    Syncope, hypotension    Social History   Socioeconomic History  . Marital status: Married    Spouse name: Not on file  . Number of children: Not on file  . Years of education: Not on file  . Highest education level: Not on file  Social Needs  . Financial resource strain: Not on file  . Food insecurity - worry: Not on file  . Food insecurity - inability: Not on file  . Transportation needs - medical: Not on file  . Transportation needs - non-medical: Not on file  Occupational History  . Not on file  Tobacco Use  . Smoking status: Never Smoker  . Smokeless tobacco: Never Used  Substance and Sexual Activity  . Alcohol use: No  . Drug use: No  . Sexual activity: Yes    Birth control/protection: None  Other Topics Concern  . Not on file  Social History Narrative  . Not on file    Family History  Problem Relation Age of Onset  . Hypertension Mother        Living  . Cancer -  Other Mother        Oral  . Hypertension Father   . Alzheimer's disease Father        Living  . Hypertension Brother   . Stroke Brother [id number]       Deceased  . Alzheimer's disease Paternal Aunt   . Stroke Paternal Aunt        #1  . Dementia Paternal Aunt        #2  . Healthy Brother        x1  . Healthy Son        x3    ROS- All systems are reviewed and negative except as per the HPI above  Physical Exam: Vitals:   01/27/17 1447  BP: 126/84  Pulse: 65  Weight: 265 lb (120.2 kg)  Height: 5\' 6"  (1.676 m)   Wt Readings from Last 3 Encounters:  01/27/17 265 lb (120.2 kg)  01/18/17 260 lb (117.9 kg)  01/11/17 261 lb 8 oz (118.6 kg)    Labs: Lab Results  Component Value Date   NA 137 01/18/2017   K 3.7 01/18/2017   CL 105 01/18/2017   CO2 22 01/18/2017    GLUCOSE 140 (H) 01/18/2017   BUN 14 01/18/2017   CREATININE 1.13 01/18/2017   CALCIUM 8.9 01/18/2017   MG 2.1 01/10/2017   No results found for: INR Lab Results  Component Value Date   CHOL 225 (H) 01/11/2017   HDL 41 01/11/2017   LDLCALC 127 (H) 01/11/2017   TRIG 284 (H) 01/11/2017     GEN- The patient is well appearing, alert and oriented x 3 today.   Head- normocephalic, atraumatic Eyes-  Sclera clear, conjunctiva pink Ears- hearing intact Oropharynx- clear Neck- supple, no JVP Lymph- no cervical lymphadenopathy Lungs- Clear to ausculation bilaterally, normal work of breathing Heart- irregular rate and rhythm, no murmurs, rubs or gallops, PMI not laterally displaced GI- soft, NT, ND, + BS Extremities- no clubbing, cyanosis, or edema MS- no significant deformity or atrophy Skin- no rash or lesion Psych- euthymic mood, full affect Neuro- strength and sensation are intact  EKG- afib at 65 bpm, qrs int 104 ms, qtc 418 ms Epic records reviewed    Assessment and Plan: 1. New onset afib General education re afib Continue diltiazem 120 mg daily Continue xarelto 20 mg daily, pt assistance forms filled out and given to pt chadsvasc score is at least 3 Bleeding precautions reviewed Needs echo and sleep study, no insurance SW in to talk to pt, [facility] and [facility] for orange card and then let me know if he qualified and we can get tests ordered  Lupita LeashDonna C. Matthew Folksarroll, ANP-C [facility]  [address] [place], [place] [address] [phone]

## 2017-01-29 ENCOUNTER — Encounter: Payer: Self-pay | Admitting: Family Medicine

## 2017-01-29 ENCOUNTER — Ambulatory Visit (INDEPENDENT_AMBULATORY_CARE_PROVIDER_SITE_OTHER): Payer: Self-pay | Admitting: Family Medicine

## 2017-01-29 VITALS — BP 114/93 | HR 66 | Temp 98.0°F | Resp 18 | Ht 66.0 in | Wt 266.0 lb

## 2017-01-29 DIAGNOSIS — Z0000 Encounter for general adult medical examination without abnormal findings: Secondary | ICD-10-CM

## 2017-01-29 DIAGNOSIS — F411 Generalized anxiety disorder: Secondary | ICD-10-CM

## 2017-01-29 DIAGNOSIS — G4730 Sleep apnea, unspecified: Secondary | ICD-10-CM

## 2017-01-29 DIAGNOSIS — R7303 Prediabetes: Secondary | ICD-10-CM

## 2017-01-29 DIAGNOSIS — G629 Polyneuropathy, unspecified: Secondary | ICD-10-CM

## 2017-01-29 DIAGNOSIS — Z8673 Personal history of transient ischemic attack (TIA), and cerebral infarction without residual deficits: Secondary | ICD-10-CM

## 2017-01-29 DIAGNOSIS — I4891 Unspecified atrial fibrillation: Secondary | ICD-10-CM

## 2017-01-29 DIAGNOSIS — Z23 Encounter for immunization: Secondary | ICD-10-CM

## 2017-01-29 LAB — COMPLETE METABOLIC PANEL WITH GFR
AG Ratio: 1.5 (calc) (ref 1.0–2.5)
ALT: 36 U/L (ref 9–46)
AST: 14 U/L (ref 10–35)
Albumin: 4.3 g/dL (ref 3.6–5.1)
Alkaline phosphatase (APISO): 68 U/L (ref 40–115)
BUN: 15 mg/dL (ref 7–25)
CALCIUM: 9.7 mg/dL (ref 8.6–10.3)
CO2: 26 mmol/L (ref 20–32)
CREATININE: 1.16 mg/dL (ref 0.70–1.25)
Chloride: 103 mmol/L (ref 98–110)
GFR, EST NON AFRICAN AMERICAN: 68 mL/min/{1.73_m2} (ref >=60)
GFR, Est African American: 78 mL/min/{1.73_m2} (ref >=60)
GLOBULIN: 2.9 g/dL (ref 1.9–3.7)
GLUCOSE: 105 mg/dL — AB (ref 65–99)
Potassium: 4.7 mmol/L (ref 3.5–5.3)
Sodium: 139 mmol/L (ref 135–146)
TOTAL PROTEIN: 7.2 g/dL (ref 6.1–8.1)
Total Bilirubin: 0.7 mg/dL (ref 0.2–1.2)

## 2017-01-29 MED ORDER — GABAPENTIN 100 MG PO CAPS: 100.0000 mg | ORAL_CAPSULE | Freq: Every day | ORAL | 3 refills | Status: DC

## 2017-01-29 MED FILL — GABAPENTIN 100 MG CAPSULE: 100 | 30 days supply | Qty: 30 | Fill #0

## 2017-01-29 NOTE — Patient Instructions (Signed)
Thanks for establishing care with me today. We will continue all medications as previously prescribed.  It is important that you take Xarelto consistently and watch for signs of bleeding (stool, urine, gums, etc.). Your body mass index is increased to 42.9, which is consistent with morbid obesity.  I recommend a low-fat, low-salt diet divided over 5-6 really small meals throughout the day and increase your water intake.  Make sure that you are physically active during the winter months. Will return in 1 month to repeat EKG. Refrain from being exposed to secondhand smoke due to dyspnea on exertion. You have pre-or borderline diabetes, watch the carbohydrate intake with white refined sugars such as grits, potatoes, rice, cookies, cakes, pies.  Also discontinued daily soft drinks and switch to diet soft drink. For lower extremity neuropathy I will start a trial of gabapentin 100 mg at bedtime.  Refrain from drinking, driving, or operating machinery while taking this medication.  An off label use for gabapentin is for generalized anxiety.  I will not start a separate medication for your anxiety on today.  We will address this when you return in 1 month. I will also send refills to community health and wellness on today.  He will also receive an influenza and pneumonia vaccination on today.  We will recheck cholesterol and hemoglobin A1c in 3 months. I have also sent a referral for a split-night sleep study due to observed sleep apnea.  Please complete financial assistance forms and let us know when you get approved

## 2017-01-29 NOTE — Progress Notes (Signed)
Subjective:    Patient ID: Edward Weaver, male    DOB: 1956-02-10, 61 y.o.   MRN: [id number]  HPI Edward Weaver, a 61 year old male with a history of hypertension and atrial fibrillation presents to establish care.  Patient states that he has not had a primary provider due to insurance and financial constraints.  He was previously admitted to inpatient services on 01/10/2017 after new onset atrial fibrillation associated with shortness of breath and dyspnea at rest.  Patient also experienced left-sided numbness and twitching.  Patient reports a history of a TIA in 2014 [facility]  Patient establish care on 01/27/2017 at the atrial fibrillation clinic.  During hospitalization patient was not cardioverted due to an unknown onset of atrial fibrillation.  He states that he has been taking Xarelto consistently and is also taking Cardizem 120 mg daily. Patient also reports possible obstructive sleep apnea. Patent has a several month history of symptoms of shortness of breath at rest and observed sleep apnea.  He states that his ex-wife has witnessed him not breathing during sleep.  Patient generally gets 4 or 5 hours of sleep per night, and states they generally have poor sleep quality.  Snoring of moderate severity and apneic episodes present.  Patient denies a history of a tonsillectomy  Past Medical History:  Diagnosis Date  . Arthritis   . Bell's palsy   . Edema   . Hiatal hernia   . Hypertension   . Stroke (HCC)   . TIA (transient ischemic attack)    Nov and/or Dec 2014   There is no immunization history on file for this patient. Review of Systems  Constitutional: Negative.   HENT: Negative.   Eyes: Negative.   Respiratory: Positive for apnea, chest tightness and shortness of breath.   Cardiovascular: Positive for leg swelling. Negative for chest pain and palpitations.  Gastrointestinal: Negative.  Negative for abdominal distention.  Endocrine: Negative.    Genitourinary: Negative.   Musculoskeletal: Negative.  Negative for arthralgias and back pain.  Skin: Negative.   Allergic/Immunologic: Negative.   Neurological: Positive for numbness.  Hematological: Negative.   Psychiatric/Behavioral: Negative.        Objective:   Physical Exam  Constitutional: He is oriented to person, place, and time.  HENT:  Head: Normocephalic and atraumatic.  Right Ear: External ear normal.  Left Ear: External ear normal.  Nose: Nose normal.  Mouth/Throat: Oropharynx is clear and moist. Abnormal dentition.  Partially edentulous  Eyes: Conjunctivae are normal. Pupils are equal, round, and reactive to light.  Neck: Normal range of motion. Neck supple.  Cardiovascular: Normal rate, regular rhythm, normal heart sounds and intact distal pulses.  Pulmonary/Chest: Effort normal and breath sounds normal.  Abdominal: Soft. Bowel sounds are normal.  Increased abdominal obesity  Musculoskeletal: Normal range of motion.  Neurological: He is alert and oriented to person, place, and time. He has normal reflexes.  Skin: Skin is warm and dry.  Psychiatric: He has a normal mood and affect. His behavior is normal. Judgment and thought content normal.      BP (!) 114/93 (BP Location: Right Arm, Patient Position: Sitting, Cuff Size: Large)   Pulse 66   Temp 98 F (36.7 C) (Oral)   Resp 18   Ht 5\' 6"  (1.676 m)   Wt 266 lb (120.7 kg)   SpO2 96%   BMI 42.93 kg/m  Assessment & Plan:  1. Atrial fibrillation, unspecified type (HCC) Continue to follow-up with atrial  fibrillation clinic.  Also continue Xarelto 20 mg daily.  Discussed watching for signs of bleeding at length.  2. History of TIAs Reviewed previous head CT, no acute abnormalities.  We will continue anticoagulation and statin therapy as previously prescribed  3. Generalized anxiety disorder We will start a trial of gabapentin 100 mg at bedtime - gabapentin (NEURONTIN) 100 MG capsule; Take 1 capsule (100  mg total) at bedtime by mouth.  Dispense: 30 capsule; Refill: 3  5. Neuropathy - gabapentin (NEURONTIN) 100 MG capsule; Take 1 capsule (100 mg total) at bedtime by mouth.  Dispense: 30 capsule; Refill: 3  6. Observed sleep apnea - Split night study; Future  7. Prediabetes - COMPLETE METABOLIC PANEL WITH GFR  8. Immunization due - Flu Vaccine QUAD 36+ mos IM (Fluarix & Fluzone Quad PF - Pneumococcal polysaccharide vaccine 23-valent greater than or equal to 2yo subcutaneous/IM  9. Healthcare maintenance - magnesium gluconate (MAGONATE) 500 MG tablet; Take 500 mg 2 (two) times daily by mouth.   RTC: One month for chronic conditions   Thayer Inabinet Rennis PettyMoore Mckoy Bhakta  MSN, FNP-C [facility] [address]  [place], [place] [address] [phone]

## 2017-02-03 ENCOUNTER — Other Ambulatory Visit (HOSPITAL_COMMUNITY): Payer: Self-pay | Admitting: *Deleted

## 2017-02-03 MED ORDER — ATORVASTATIN CALCIUM 40 MG PO TABS: 40.0000 mg | ORAL_TABLET | Freq: Every day | ORAL | 3 refills | Status: DC

## 2017-02-03 MED ORDER — DILTIAZEM HCL ER COATED BEADS 120 MG PO CP24: 120.0000 mg | ORAL_CAPSULE | Freq: Every day | ORAL | 3 refills | Status: DC

## 2017-02-03 MED ORDER — RIVAROXABAN 20 MG PO TABS: 20.0000 mg | ORAL_TABLET | Freq: Every day | ORAL | 3 refills | Status: DC

## 2017-02-03 MED ORDER — RIVAROXABAN 20 MG PO TABS: 20.0000 mg | ORAL_TABLET | Freq: Every day | ORAL | 0 refills | Status: DC

## 2017-02-03 MED FILL — DILTIAZEM 24HR ER 120 MG CA: 120 | 30 days supply | Qty: 30 | Fill #0

## 2017-02-03 MED FILL — ?ATORVASTATIN 40MG TABLET: 40 | 30 days supply | Qty: 30 | Fill #0

## 2017-02-03 MED FILL — XARELTO 20 MG TABLET: 20 | 10 days supply | Qty: 10 | Fill #0

## 2017-02-18 ENCOUNTER — Ambulatory Visit: Payer: Self-pay | Attending: Internal Medicine

## 2017-03-01 ENCOUNTER — Ambulatory Visit (INDEPENDENT_AMBULATORY_CARE_PROVIDER_SITE_OTHER): Payer: Self-pay | Admitting: Family Medicine

## 2017-03-01 ENCOUNTER — Encounter: Payer: Self-pay | Admitting: Family Medicine

## 2017-03-01 VITALS — BP 138/88 | HR 72 | Temp 98.3°F | Resp 18 | Ht 66.0 in | Wt 267.0 lb

## 2017-03-01 DIAGNOSIS — R42 Dizziness and giddiness: Secondary | ICD-10-CM

## 2017-03-01 DIAGNOSIS — Z8679 Personal history of other diseases of the circulatory system: Secondary | ICD-10-CM

## 2017-03-01 DIAGNOSIS — G629 Polyneuropathy, unspecified: Secondary | ICD-10-CM

## 2017-03-01 DIAGNOSIS — Z8673 Personal history of transient ischemic attack (TIA), and cerebral infarction without residual deficits: Secondary | ICD-10-CM

## 2017-03-01 DIAGNOSIS — Z6841 Body Mass Index (BMI) 40.0 and over, adult: Secondary | ICD-10-CM

## 2017-03-01 DIAGNOSIS — R7303 Prediabetes: Secondary | ICD-10-CM

## 2017-03-01 MED FILL — ?ATORVASTATIN 40MG TABLET: 40 | 30 days supply | Qty: 30 | Fill #1

## 2017-03-01 MED FILL — DILTIAZEM 24HR ER 120 MG CA: 120 | 30 days supply | Qty: 30 | Fill #1

## 2017-03-01 MED FILL — XARELTO 20 MG TABLET: 20 | 10 days supply | Qty: 10 | Fill #1

## 2017-03-01 NOTE — Progress Notes (Signed)
Chief Complaint  Patient presents with  . Follow-up    1 month follow up for chronic conditions     Subjective:    Patient ID: Edward Weaver, male    DOB: 07-Apr-1955, 61 y.o.   MRN: [id number]  HPI Edward Weaver Weaver, a 61 year old male with a history of hypertension and atrial fibrillation presents accompanied by wife for a 1 month follow up of neuropathy.   Atrial fibrillation  He was previously admitted to inpatient services on 01/10/2017 after new onset atrial fibrillation associated with shortness of breath and dyspnea at rest.  Patient also experienced left-sided numbness and twitching.  Patient reports a history of a TIA in 2014 that was diagnosed at [facility].  Patient establish care on 01/27/2017 at the atrial fibrillation clinic.  During hospitalization patient was not cardioverted due to an unknown onset of atrial fibrillation.  He states that he has been taking Xarelto consistently and is also taking Cardizem 120 mg daily.  Patient also has a history of neuropathy. Pain characterized as intermittent numbness and burning.  Onset of symptoms was several months ago. Symptoms are currently of moderate severity. He was prescribed Gabapentin 1 month ago and did not start medication. He says that he was "scared to take medication". Patient denies headache, falls, or weakness. Mr Edward Weaver endorses dizziness.   Past Medical History:  Diagnosis Date  . Arthritis   . Bell's palsy   . Edema   . Hiatal hernia   . Hypertension   . Stroke (HCC)   . TIA (transient ischemic attack)    Nov and/or Dec 2014   Immunization History  Administered Date(s) Administered  . Influenza,inj,Quad PF,6+ Mos 01/29/2017  . Pneumococcal Polysaccharide-23 01/29/2017   Review of Systems  Constitutional: Negative.   HENT: Negative.   Eyes: Negative.   Respiratory: Positive for apnea.   Cardiovascular: Negative for chest pain and palpitations.  Gastrointestinal: Negative.  Negative for abdominal  distention.  Endocrine: Negative.   Genitourinary: Negative.   Musculoskeletal: Negative.  Negative for arthralgias and back pain.  Skin: Negative.   Allergic/Immunologic: Negative.   Neurological: Positive for dizziness and numbness.  Hematological: Negative.   Psychiatric/Behavioral: Negative.        Objective:   Physical Exam  Constitutional: He is oriented to person, place, and time.  HENT:  Head: Normocephalic and atraumatic.  Right Ear: External ear normal.  Left Ear: External ear normal.  Nose: Nose normal.  Mouth/Throat: Oropharynx is clear and moist. Abnormal dentition.  Partially edentulous  Eyes: Conjunctivae are normal. Pupils are equal, round, and reactive to light.  Neck: Normal range of motion. Neck supple.  Cardiovascular: Normal rate, regular rhythm, normal heart sounds and intact distal pulses.  Pulmonary/Chest: Effort normal and breath sounds normal.  Abdominal: Soft. Bowel sounds are normal.  Increased abdominal obesity  Musculoskeletal: Normal range of motion.  Neurological: He is alert and oriented to person, place, and time. He has normal reflexes.  Skin: Skin is warm and dry.  Psychiatric: He has a normal mood and affect. His behavior is normal. Judgment and thought content normal.      BP 138/88 (BP Location: Left Arm, Patient Position: Sitting, Cuff Size: Large) Comment: manually  Pulse 72   Temp 98.3 F (36.8 C) (Oral)   Resp 18   Ht 5\' 6"  (1.676 m)   Wt 267 lb (121.1 kg)   SpO2 97%   BMI 43.09 kg/m  Assessment & Plan:    Dizziness Orthostatic vital signs unremarkable.  EKG is consistent with previous.  Continue Xarelto 20 mg  - Orthostatic vital signs - EKG 12-Lead  History of atrial fibrillation Continue Xarelto 20 mg as directed. Discussed medication and potential side effects at length. A full discussion of the nature of anticoagulants has been carried out.  A benefit risk analysis has been presented to the patient, so that they  understand the justification for choosing anticoagulation at this time. The need for frequent and regular monitoring, precise dosage adjustment and compliance is stressed.  Side effects of potential bleeding are discussed.  The patient should avoid any OTC items containing aspirin or ibuprofen. Avoid alcohol consumption.  Call if any signs of abnormal bleeding.     Class 3 severe obesity due to excess calories with serious comorbidity and body mass index (BMI) of 40.0 to 44.9 in adult Edward Weaver(HCC) Recommend a lowfat, low carbohydrate diet divided over 5-6 small meals, increase water intake to 6-8 glasses, and 150 minutes per week of cardiovascular exercise.    Prediabetes Hemoglobin a1C is 6.0, which is consistent with prediabetes. The patient is asked to make an attempt to improve diet and exercise patterns to aid in medical management of this problem.   History of TIA (transient ischemic attack) Reviewed previous head CT, no acute abnormalities.  We will continue anticoagulation and statin therapy as previously prescribed  Neuropathy - gabapentin (NEURONTIN) 100 MG capsule; Take 1 capsule (100 mg total) at bedtime by mouth.  Dispense: 30 capsule; Refill: 3   2 months for chronic conditions  Edward NationsLachina Moore Dovid Bartko  MSN, FNP-C Patient Care [facility] [address]  [place], [place] [id number] [phone]

## 2017-03-01 NOTE — Patient Instructions (Addendum)
Your EKG is consistent with previous, which shows atrial fibrilation. Continue Xarelto 20 mg and Cardizem as prescribed. Please schedule follow up in cardiology. Will follow up in 2 months. - Continue medication, monitor blood pressure at home. Continue DASH diet. Reminder to go to the ER if any CP, SOB, nausea, dizziness, severe HA, changes vision/speech, left arm numbness and tingling and jaw pain.  Will start a trial of Gabapentin 100 mg at bedtime to help with neuropathy.    You have prediabetes. Recommend a lowfat, low carbohydrate diet divided over 5-6 small meals, increase water intake to 6-8 glasses, and 150 minutes per week of cardiovascular exercise.    A full discussion of the nature of anticoagulants has been carried out.  A benefit risk analysis has been presented to the patient, so that they understand the justification for choosing anticoagulation at this time. The need for frequent and regular monitoring, precise dosage adjustment and compliance is stressed.  Side effects of potential bleeding are discussed.  The patient should avoid any OTC items containing aspirin or ibuprofen.  Avoid alcohol consumption.  Call if any signs of abnormal bleeding.     Gabapentin capsules or tablets What is this medicine? GABAPENTIN (GA ba pen tin) is used to control partial seizures in adults with epilepsy. It is also used to treat certain types of nerve pain. This medicine may be used for other purposes; ask your health care provider or pharmacist if you have questions. COMMON BRAND NAME(S): Active-PAC with Gabapentin, Gabarone, Neurontin What should I tell my health care provider before I take this medicine? They need to know if you have any of these conditions: -kidney disease -suicidal thoughts, plans, or attempt; a previous suicide attempt by you or a family member -an unusual or allergic reaction to gabapentin, other medicines, foods, dyes, or preservatives -pregnant or trying to get  pregnant -breast-feeding How should I use this medicine? Take this medicine by mouth with a glass of water. Follow the directions on the prescription label. You can take it with or without food. If it upsets your stomach, take it with food.Take your medicine at regular intervals. Do not take it more often than directed. Do not stop taking except on your doctor's advice. If you are directed to break the 600 or 800 mg tablets in half as part of your dose, the extra half tablet should be used for the next dose. If you have not used the extra half tablet within 28 days, it should be thrown away. A special MedGuide will be given to you by the pharmacist with each prescription and refill. Be sure to read this information carefully each time. Talk to your pediatrician regarding the use of this medicine in children. Special care may be needed. Overdosage: If you think you have taken too much of this medicine contact a poison control center or emergency room at once. NOTE: This medicine is only for you. Do not share this medicine with others. What if I miss a dose? If you miss a dose, take it as soon as you can. If it is almost time for your next dose, take only that dose. Do not take double or extra doses. What may interact with this medicine? Do not take this medicine with any of the following medications: -other gabapentin products This medicine may also interact with the following medications: -alcohol -antacids -antihistamines for allergy, cough and cold -certain medicines for anxiety or sleep -certain medicines for depression or psychotic disturbances -homatropine; hydrocodone -naproxen -  narcotic medicines (opiates) for pain -phenothiazines like chlorpromazine, mesoridazine, prochlorperazine, thioridazine This list may not describe all possible interactions. Give your health care provider a list of all the medicines, herbs, non-prescription drugs, or dietary supplements you use. Also tell them  if you smoke, drink alcohol, or use illegal drugs. Some items may interact with your medicine. What should I watch for while using this medicine? Visit your doctor or health care professional for regular checks on your progress. You may want to keep a record at home of how you feel your condition is responding to treatment. You may want to share this information with your doctor or health care professional at each visit. You should contact your doctor or health care professional if your seizures get worse or if you have any new types of seizures. Do not stop taking this medicine or any of your seizure medicines unless instructed by your doctor or health care professional. Stopping your medicine suddenly can increase your seizures or their severity. Wear a medical identification bracelet or chain if you are taking this medicine for seizures, and carry a card that lists all your medications. You may get drowsy, dizzy, or have blurred vision. Do not drive, use machinery, or do anything that needs mental alertness until you know how this medicine affects you. To reduce dizzy or fainting spells, do not sit or stand up quickly, especially if you are an older patient. Alcohol can increase drowsiness and dizziness. Avoid alcoholic drinks. Your mouth may get dry. Chewing sugarless gum or sucking hard candy, and drinking plenty of water will help. The use of this medicine may increase the chance of suicidal thoughts or actions. Pay special attention to how you are responding while on this medicine. Any worsening of mood, or thoughts of suicide or dying should be reported to your health care professional right away. Women who become pregnant while using this medicine may enroll in the [place] Antiepileptic Drug Pregnancy Registry by calling [phone]. This registry collects information about the safety of antiepileptic drug use during pregnancy. What side effects may I notice from receiving this  medicine? Side effects that you should report to your doctor or health care professional as soon as possible: -allergic reactions like skin rash, itching or hives, swelling of the face, lips, or tongue -worsening of mood, thoughts or actions of suicide or dying Side effects that usually do not require medical attention (report to your doctor or health care professional if they continue or are bothersome): -constipation -difficulty walking or controlling muscle movements -dizziness -nausea -slurred speech -tiredness -tremors -weight gain This list may not describe all possible side effects. Call your doctor for medical advice about side effects. You may report side effects to FDA at 1-800-FDA-1088. Where should I keep my medicine? Keep out of reach of children. This medicine may cause accidental overdose and death if it taken by other adults, children, or pets. Mix any unused medicine with a substance like cat litter or coffee grounds. Then throw the medicine away in a sealed container like a sealed bag or a coffee can with a lid. Do not use the medicine after the expiration date. Store at room temperature between 15 and 30 degrees C (59 and 86 degrees F). NOTE: This sheet is a summary. It may not cover all possible information. If you have questions about this medicine, talk to your doctor, pharmacist, or health care provider.  2018 Elsevier/Gold Standard (2013-04-28 15:26:50)  Atrial Fibrillation Atrial fibrillation is a type of  heartbeat that is irregular or fast (rapid). If you have this condition, your heart keeps quivering in a weird (chaotic) way. This condition can make it so your heart cannot pump blood normally. Having this condition gives a person more risk for stroke, heart failure, and other heart problems. There are different types of atrial fibrillation. Talk with your doctor to learn about the type that you have. Follow these instructions at home:  Take over-the-counter and  prescription medicines only as told by your doctor.  If your doctor prescribed a blood-thinning medicine, take it exactly as told. Taking too much of it can cause bleeding. If you do not take enough of it, you will not have the protection that you need against stroke and other problems.  Do not use any tobacco products. These include cigarettes, chewing tobacco, and e-cigarettes. If you need help quitting, ask your doctor.  If you have apnea (obstructive sleep apnea), manage it as told by your doctor.  Do not drink alcohol.  Do not drink beverages that have caffeine. These include coffee, soda, and tea.  Maintain a healthy weight. Do not use diet pills unless your doctor says they are safe for you. Diet pills may make heart problems worse.  Follow diet instructions as told by your doctor.  Exercise regularly as told by your doctor.  Keep all follow-up visits as told by your doctor. This is important. Contact a doctor if:  You notice a change in the speed, rhythm, or strength of your heartbeat.  You are taking a blood-thinning medicine and you notice more bruising.  You get tired more easily when you move or exercise. Get help right away if:  You have pain in your chest or your belly (abdomen).  You have sweating or weakness.  You feel sick to your stomach (nauseous).  You notice blood in your throw up (vomit), poop (stool), or pee (urine).  You are short of breath.  You suddenly have swollen feet and ankles.  You feel dizzy.  Your suddenly get weak or numb in your face, arms, or legs, especially if it happens on one side of your body.  You have trouble talking, trouble understanding, or both.  Your face or your eyelid droops on one side. These symptoms may be an emergency. Do not wait to see if the symptoms will go away. Get medical help right away. Call your local emergency services (911 in the [place] Do not drive yourself to the hospital. This information is not  intended to replace advice given to you by your health care provider. Make sure you discuss any questions you have with your health care provider. Document Released: 12/10/2007 Document Revised: 08/08/2015 Document Reviewed: 06/27/2014 Elsevier Interactive Patient Education  2018 [employer] Prediabetes Eating Plan Prediabetes-also called impaired glucose tolerance or impaired fasting glucose-is a condition that causes blood sugar (blood glucose) levels to be higher than normal. Following a healthy diet can help to keep prediabetes under control. It can also help to lower the risk of type 2 diabetes and heart disease, which are increased in people who have prediabetes. Along with regular exercise, a healthy diet:  Promotes weight loss.  Helps to control blood sugar levels.  Helps to improve the way that the body uses insulin.  What do I need to know about this eating plan?  Use the glycemic index (GI) to plan your meals. The index tells you how quickly a food will raise your blood sugar. Choose low-GI foods. These foods take  a longer time to raise blood sugar.  Pay close attention to the amount of carbohydrates in the food that you eat. Carbohydrates increase blood sugar levels.  Keep track of how many calories you take in. Eating the right amount of calories will help you to achieve a healthy weight. Losing about 7 percent of your starting weight can help to prevent type 2 diabetes.  You may want to follow a Mediterranean diet. This diet includes a lot of vegetables, lean meats or fish, whole grains, fruits, and healthy oils and fats. What foods can I eat? Grains Whole grains, such as whole-wheat or whole-grain breads, crackers, cereals, and pasta. Unsweetened oatmeal. Bulgur. Barley. Quinoa. Brown rice. Corn or whole-wheat flour tortillas or taco shells. Vegetables Lettuce. Spinach. Peas. Beets. Cauliflower. Cabbage. Broccoli. Carrots. Tomatoes. Squash. Eggplant. Herbs. Peppers. Onions.  Cucumbers. Brussels sprouts. Fruits Berries. Bananas. Apples. Oranges. Grapes. Papaya. Mango. Pomegranate. Kiwi. Grapefruit. Cherries. Meats and Other Protein Sources Seafood. Lean meats, such as chicken and [place] or lean cuts of pork and beef. Tofu. Eggs. Nuts. Beans. Dairy Low-fat or fat-free dairy products, such as yogurt, cottage cheese, and cheese. Beverages Water. Tea. Coffee. Sugar-free or diet soda. Seltzer water. Milk. Milk alternatives, such as soy or almond milk. Condiments Mustard. Relish. Low-fat, low-sugar ketchup. Low-fat, low-sugar barbecue sauce. Low-fat or fat-free mayonnaise. Sweets and Desserts Sugar-free or low-fat pudding. Sugar-free or low-fat ice cream and other frozen treats. Fats and Oils Avocado. Walnuts. Olive oil. The items listed above may not be a complete list of recommended foods or beverages. Contact your dietitian for more options. What foods are not recommended? Grains Refined white flour and flour products, such as bread, pasta, snack foods, and cereals. Beverages Sweetened drinks, such as sweet iced tea and soda. Sweets and Desserts Baked goods, such as cake, cupcakes, pastries, cookies, and cheesecake. The items listed above may not be a complete list of foods and beverages to avoid. Contact your dietitian for more information. This information is not intended to replace advice given to you by your health care provider. Make sure you discuss any questions you have with your health care provider. Document Released: 07/17/2014 Document Revised: 08/08/2015 Document Reviewed: 03/28/2014 Elsevier Interactive Patient Education  2017 Elsevier Inc.  Prediabetes Prediabetes is the condition of having a blood sugar (blood glucose) level that is higher than it should be, but not high enough for you to be diagnosed with type 2 diabetes. Having prediabetes puts you at risk for developing type 2 diabetes (type 2 diabetes mellitus). Prediabetes may be called  impaired glucose tolerance or impaired fasting glucose. Prediabetes usually does not cause symptoms. Your health care provider can diagnose this condition with blood tests. You may be tested for prediabetes if you are overweight and if you have at least one other risk factor for prediabetes. Risk factors for prediabetes include:  Having a family member with type 2 diabetes.  Being overweight or obese.  Being older than age 61.  Being of American-Indian, African-American, Hispanic/Latino, or Asian/Pacific Islander descent.  Having an inactive (sedentary) lifestyle.  Having a history of gestational diabetes or polycystic ovarian syndrome (PCOS).  Having low levels of good cholesterol (HDL-C) or high levels of blood fats (triglycerides).  Having high blood pressure.  What is blood glucose and how is blood glucose measured?  Blood glucose refers to the amount of glucose in your bloodstream. Glucose comes from eating foods that contain sugars and starches (carbohydrates) that the body breaks down into glucose. Your blood glucose  level may be measured in mg/dL (milligrams per deciliter) or mmol/L (millimoles per liter).Your blood glucose may be checked with one or more of the following blood tests:  A fasting blood glucose (FBG) test. You will not be allowed to eat (you will fast) for at least 8 hours before a blood sample is taken. ? A normal range for FBG is 70-100 mg/dl (4.5-4.0 mmol/L).  An A1c (hemoglobin A1c) blood test. This test provides information about blood glucose control over the previous 2?3months.  An oral glucose tolerance test (OGTT). This test measures your blood glucose twice: ? After fasting. This is your baseline level. ? Two hours after you drink a beverage that contains glucose.  You may be diagnosed with prediabetes:  If your FBG is 100?125 mg/dL (9.8-1.1 mmol/L).  If your A1c level is 5.7?6.4%.  If your OGGT result is 140?199 mg/dL (9.1-47  mmol/L).  These blood tests may be repeated to confirm your diagnosis. What happens if blood glucose is too high? The pancreas produces a hormone (insulin) that helps move glucose from the bloodstream into cells. When cells in the body do not respond properly to insulin that the body makes (insulin resistance), excess glucose builds up in the blood instead of going into cells. As a result, high blood glucose (hyperglycemia) can develop, which can cause many complications. This is a symptom of prediabetes. What can happen if blood glucose stays higher than normal for a long time? Having high blood glucose for a long time is dangerous. Too much glucose in your blood can damage your nerves and blood vessels. Long-term damage can lead to complications from diabetes, which may include:  Heart disease.  Stroke.  Blindness.  Kidney disease.  Depression.  Poor circulation in the feet and legs, which could lead to surgical removal (amputation) in severe cases.  How can prediabetes be prevented from turning into type 2 diabetes?  To help prevent type 2 diabetes, take the following actions:  Be physically active. ? Do moderate-intensity physical activity for at least 30 minutes on at least 5 days of the week, or as much as told by your health care provider. This could be brisk walking, biking, or water aerobics. ? Ask your health care provider what activities are safe for you. A mix of physical activities may be best, such as walking, swimming, cycling, and strength training.  Lose weight as told by your health care provider. ? Losing 5-7% of your body weight can reverse insulin resistance. ? Your health care provider can determine how much weight loss is best for you and can help you lose weight safely.  Follow a healthy meal plan. This includes eating lean proteins, complex carbohydrates, fresh fruits and vegetables, low-fat dairy products, and healthy fats. ? Follow instructions from your  health care provider about eating or drinking restrictions. ? Make an appointment to see a diet and nutrition specialist (registered dietitian) to help you create a healthy eating plan that is right for you.  Do not smoke or use any tobacco products, such as cigarettes, chewing tobacco, and e-cigarettes. If you need help quitting, ask your health care provider.  Take over-the-counter and prescription medicines as told by your health care provider. You may be prescribed medicines that help lower the risk of type 2 diabetes.  This information is not intended to replace advice given to you by your health care provider. Make sure you discuss any questions you have with your health care provider. Document Released: 06/24/2015 Document Revised:  08/08/2015 Document Reviewed: 04/23/2015 Elsevier Interactive Patient Education  [employer].

## 2017-03-05 ENCOUNTER — Ambulatory Visit (HOSPITAL_BASED_OUTPATIENT_CLINIC_OR_DEPARTMENT_OTHER): Payer: Self-pay | Attending: Family Medicine | Admitting: Internal Medicine

## 2017-03-05 VITALS — Ht 66.0 in | Wt 267.0 lb

## 2017-03-05 DIAGNOSIS — G4730 Sleep apnea, unspecified: Secondary | ICD-10-CM | POA: Insufficient documentation

## 2017-03-08 DIAGNOSIS — Z8679 Personal history of other diseases of the circulatory system: Secondary | ICD-10-CM | POA: Insufficient documentation

## 2017-03-08 DIAGNOSIS — R42 Dizziness and giddiness: Secondary | ICD-10-CM | POA: Insufficient documentation

## 2017-03-08 DIAGNOSIS — G629 Polyneuropathy, unspecified: Secondary | ICD-10-CM | POA: Insufficient documentation

## 2017-03-11 ENCOUNTER — Ambulatory Visit: Payer: Self-pay | Attending: Family Medicine

## 2017-03-11 MED FILL — XARELTO 20 MG TABLET: 20 | 30 days supply | Qty: 30 | Fill #2

## 2017-03-25 ENCOUNTER — Encounter (HOSPITAL_BASED_OUTPATIENT_CLINIC_OR_DEPARTMENT_OTHER): Payer: Self-pay | Admitting: *Deleted

## 2017-03-25 ENCOUNTER — Emergency Department (HOSPITAL_BASED_OUTPATIENT_CLINIC_OR_DEPARTMENT_OTHER): Payer: Self-pay

## 2017-03-25 ENCOUNTER — Emergency Department (HOSPITAL_BASED_OUTPATIENT_CLINIC_OR_DEPARTMENT_OTHER)
Admission: EM | Admit: 2017-03-25 | Discharge: 2017-03-25 | Disposition: A | Discharge status: Home / Self Care | Payer: Self-pay | Attending: Emergency Medicine | Admitting: Emergency Medicine

## 2017-03-25 ENCOUNTER — Other Ambulatory Visit: Payer: Self-pay

## 2017-03-25 DIAGNOSIS — Z7901 Long term (current) use of anticoagulants: Secondary | ICD-10-CM | POA: Insufficient documentation

## 2017-03-25 DIAGNOSIS — H5789 Other specified disorders of eye and adnexa: Secondary | ICD-10-CM | POA: Insufficient documentation

## 2017-03-25 DIAGNOSIS — Z79899 Other long term (current) drug therapy: Secondary | ICD-10-CM | POA: Insufficient documentation

## 2017-03-25 DIAGNOSIS — G510 Bell's palsy: Secondary | ICD-10-CM | POA: Insufficient documentation

## 2017-03-25 DIAGNOSIS — I10 Essential (primary) hypertension: Secondary | ICD-10-CM | POA: Insufficient documentation

## 2017-03-25 HISTORY — DX: Unspecified atrial fibrillation: I48.91

## 2017-03-25 LAB — DIFFERENTIAL
BASOS ABS: 0.1 10*3/uL (ref 0.0–0.1)
BASOS PCT: 1 %
EOS ABS: 0.6 10*3/uL (ref 0.0–0.7)
Eosinophils Relative: 10 %
Lymphocytes Relative: 33 %
Lymphs Abs: 2.1 10*3/uL (ref 0.7–4.0)
MONOS PCT: 6 %
Monocytes Absolute: 0.4 10*3/uL (ref 0.1–1.0)
NEUTROS ABS: 3.1 10*3/uL (ref 1.7–7.7)
Neutrophils Relative %: 50 %

## 2017-03-25 LAB — URINALYSIS, ROUTINE W REFLEX MICROSCOPIC
BILIRUBIN URINE: NEGATIVE
Glucose, UA: NEGATIVE mg/dL
HGB URINE DIPSTICK: NEGATIVE
KETONES UR: NEGATIVE mg/dL
Leukocytes, UA: NEGATIVE
Nitrite: NEGATIVE
PH: 6 (ref 5.0–8.0)
Protein, ur: NEGATIVE mg/dL
SPECIFIC GRAVITY, URINE: 1.025 (ref 1.005–1.030)

## 2017-03-25 LAB — COMPREHENSIVE METABOLIC PANEL
ALT: 32 U/L (ref 17–63)
AST: 18 U/L (ref 15–41)
Albumin: 3.8 g/dL (ref 3.5–5.0)
Alkaline Phosphatase: 64 U/L (ref 38–126)
Anion gap: 8 (ref 5–15)
BUN: 18 mg/dL (ref 6–20)
CHLORIDE: 104 mmol/L (ref 101–111)
CO2: 25 mmol/L (ref 22–32)
Calcium: 9.1 mg/dL (ref 8.9–10.3)
Creatinine, Ser: 1.13 mg/dL (ref 0.61–1.24)
Glucose, Bld: 134 mg/dL — ABNORMAL HIGH (ref 65–99)
POTASSIUM: 4.1 mmol/L (ref 3.5–5.1)
Sodium: 137 mmol/L (ref 135–145)
Total Bilirubin: 0.5 mg/dL (ref 0.3–1.2)
Total Protein: 7.4 g/dL (ref 6.5–8.1)

## 2017-03-25 LAB — CBC
HEMATOCRIT: 42 % (ref 39.0–52.0)
Hemoglobin: 14.4 g/dL (ref 13.0–17.0)
MCH: 29.6 pg (ref 26.0–34.0)
MCHC: 34.3 g/dL (ref 30.0–36.0)
MCV: 86.2 fL (ref 78.0–100.0)
PLATELETS: 305 10*3/uL (ref 150–400)
RBC: 4.87 MIL/uL (ref 4.22–5.81)
RDW: 12.6 % (ref 11.5–15.5)
WBC: 6.2 10*3/uL (ref 4.0–10.5)

## 2017-03-25 LAB — TROPONIN I

## 2017-03-25 LAB — RAPID URINE DRUG SCREEN, HOSP PERFORMED
AMPHETAMINES: NOT DETECTED
BENZODIAZEPINES: NOT DETECTED
Barbiturates: NOT DETECTED
Cocaine: NOT DETECTED
OPIATES: NOT DETECTED
TETRAHYDROCANNABINOL: NOT DETECTED

## 2017-03-25 LAB — APTT: APTT: 34 s (ref 24–36)

## 2017-03-25 LAB — ETHANOL

## 2017-03-25 LAB — PROTIME-INR
INR: 1.1
PROTHROMBIN TIME: 14.1 s (ref 11.4–15.2)

## 2017-03-25 MED ORDER — POLYVINYL ALCOHOL 1.4 % OP SOLN: 1.0000 [drp] | OPHTHALMIC | 2 refills | Status: DC

## 2017-03-25 MED ORDER — PREDNISONE 10 MG PO TABS
60.0000 mg | ORAL_TABLET | Freq: Once | ORAL | Status: AC
  Administered 2017-03-25: 19:00:00 60 mg via ORAL
  Filled 2017-03-25: qty 1

## 2017-03-25 MED ORDER — PREDNISONE 10 MG PO TABS: 40.0000 mg | ORAL_TABLET | Freq: Every day | ORAL | 0 refills | Status: AC

## 2017-03-25 NOTE — ED Triage Notes (Signed)
Facial numbness to both sided of his face x 2 days. States his right eye is tearing and does not shut completely. He has a hx of bells palsy years ago. When he eats liquids drool from the right side of his mouth. He is ambulatory. No distress. He does not have extremity involvement.

## 2017-03-25 NOTE — ED Provider Notes (Signed)
[facility] EMERGENCY DEPARTMENT Provider Note   CSN: [id number] Arrival date & time: 03/25/17  1442     History   Chief Complaint Chief Complaint  Patient presents with  . Numbness    HPI Edward Weaver is a 62 y.o. male.  Patient with complaint of gradual worsening right facial weakness drooping of the mouth tearing of the eyes over the past 3 days.  Patient's wife said things started 3 days ago.  Patient has a past history of Bell's palsy.  Not recently.  Patient also has a past history of TIA in December 2014.  Patient is also had some teeth problems.  But no tooth pain currently.  Patient denies any weakness anywhere else or any speech problems.  No numbness anywhere else.      Past Medical History:  Diagnosis Date  . Arthritis   . Atrial fibrillation (HCC)   . Bell's palsy   . Edema   . Hiatal hernia   . Hypertension   . Stroke (HCC)   . TIA (transient ischemic attack)    Nov and/or Dec 2014    Patient Active Problem List   Diagnosis Date Noted  . Neuropathy 03/08/2017  . Dizziness 03/08/2017  . History of atrial fibrillation 03/08/2017  . A-fib (HCC) 01/10/2017  . Obesity 01/10/2017  . Anxiety 08/02/2013  . Right ankle pain 08/02/2013  . TIA (transient ischemic attack) 08/02/2013  . Visit for preventive health examination 07/24/2013  . OA (osteoarthritis) of knee 07/24/2013  . Venous insufficiency of right lower extremity 07/24/2013    Past Surgical History:  Procedure Laterality Date  . ESOPHAGUS SURGERY    . KNEE SURGERY Right    1974  . TONSILLECTOMY    . WISDOM TOOTH EXTRACTION         Home Medications    Prior to Admission medications   Medication Sig Start Date End Date Taking? Authorizing Provider  atorvastatin (LIPITOR) 40 MG tablet Take 1 tablet (40 mg total) by mouth daily at 6 PM. 02/03/17   Newman Niparroll, Donna C, NP  diltiazem (CARDIZEM CD) 120 MG 24 hr capsule Take 1 capsule (120 mg total) by mouth daily. 02/03/17    Newman Niparroll, Donna C, NP  gabapentin (NEURONTIN) 100 MG capsule Take 1 capsule (100 mg total) at bedtime by mouth. 01/29/17   Massie MaroonHollis, Lachina M, FNP  magnesium gluconate (MAGONATE) 500 MG tablet Take 500 mg 2 (two) times daily by mouth.    [provider]  polyethylene glycol (MIRALAX / GLYCOLAX) packet Take 17 g by mouth daily as needed for mild constipation. Patient not taking: Reported on 01/29/2017 01/11/17   Joseph ArtVann, Jessica U, DO  polyvinyl alcohol (LIQUIFILM TEARS) 1.4 % ophthalmic solution Place 1 drop into the right eye every 2 (two) hours. 03/25/17   Vanetta MuldersZackowski, Andretta Ergle, MD  predniSONE (DELTASONE) 10 MG tablet Take 4 tablets (40 mg total) by mouth daily for 7 days. 03/25/17 04/01/17  Vanetta MuldersZackowski, Verlisa Vara, MD  rivaroxaban (XARELTO) 20 MG TABS tablet Take 1 tablet (20 mg total) by mouth daily. 02/03/17   Newman Niparroll, Donna C, NP  traZODone (DESYREL) 50 MG tablet Take 1 tablet (50 mg total) by mouth at bedtime as needed for sleep. Patient not taking: Reported on 01/27/2017 01/11/17   Joseph ArtVann, Jessica U, DO    Family History Family History  Problem Relation Age of Onset  . Hypertension Mother        Living  . Cancer - Other Mother  Oral  . Hypertension Father   . Alzheimer's disease Father        Living  . Hypertension Brother   . Stroke Brother 98       Deceased  . Alzheimer's disease Paternal Aunt   . Stroke Paternal Aunt        #1  . Dementia Paternal Aunt        #2  . Healthy Brother        x1  . Healthy Son        x3    Social History Social History   Tobacco Use  . Smoking status: Never Smoker  . Smokeless tobacco: Never Used  Substance Use Topics  . Alcohol use: No  . Drug use: No     Allergies   Penicillins   Review of Systems Review of Systems  Constitutional: Negative for fatigue and fever.  HENT: Negative for congestion.   Eyes: Negative for redness.  Respiratory: Negative for shortness of breath.   Cardiovascular: Negative for chest pain.    Gastrointestinal: Negative for abdominal pain.  Genitourinary: Negative for dysuria.  Musculoskeletal: Positive for neck pain.  Skin: Negative for rash.  Neurological: Positive for facial asymmetry. Negative for syncope, speech difficulty and headaches.  Hematological: Does not bruise/bleed easily.  Psychiatric/Behavioral: Negative for confusion.     Physical Exam Updated Vital Signs BP 134/80 (BP Location: Left Arm)   Pulse 65   Temp 97.9 F (36.6 C) (Oral)   Resp 20   Ht 1.676 m (5\' 6" )   Wt 120.2 kg (265 lb)   SpO2 95%   BMI 42.77 kg/m   Physical Exam  Constitutional: He is oriented to person, place, and time. He appears well-developed and well-nourished.  HENT:  Head: Normocephalic and atraumatic.  Mouth/Throat: Oropharynx is clear and moist.  Facial weakness on the right side.  Loss of skin folds.  Droop of the mouth on the right side.  Left side normal.  Weakness in raising eyebrow on the right side.  Some loss of skin folds on the forehead.  Again only on the right side.  Tearing of the right eye.  Some slight erythema.  Eyes: EOM are normal. Pupils are equal, round, and reactive to light. No scleral icterus.  Neck: Neck supple.  Cardiovascular: Normal rate.  Irregular rhythm  Pulmonary/Chest: Effort normal and breath sounds normal. No respiratory distress.  Abdominal: Soft. Bowel sounds are normal. There is no tenderness.  Musculoskeletal: Normal range of motion.  Neurological: He is alert and oriented to person, place, and time. A cranial nerve deficit is present. No sensory deficit. He exhibits normal muscle tone. Coordination normal.  Right-sided facial droop that includes the right forehead.  Tearing of the right eye difficulty closing eye completely.  Skin: Skin is warm. No rash noted.  Nursing note and vitals reviewed.    ED Treatments / Results  Labs (all labs ordered are listed, but only abnormal results are displayed) Labs Reviewed  COMPREHENSIVE  METABOLIC PANEL - Abnormal; Notable for the following components:      Result Value   Glucose, Bld 134 (*)    All other components within normal limits  ETHANOL  PROTIME-INR  APTT  CBC  DIFFERENTIAL  TROPONIN I  RAPID URINE DRUG SCREEN, HOSP PERFORMED  URINALYSIS, ROUTINE W REFLEX MICROSCOPIC    EKG  EKG Interpretation  Date/Time:  Thursday March 25 2017 15:34:50 EST Ventricular Rate:  56 PR Interval:    QRS Duration: 121 QT  Interval:  406 QTC Calculation: 392 R Axis:   80 Text Interpretation:  Age not entered, assumed to be  62 years old for purpose of ECG interpretation Atrial fibrillation Nonspecific intraventricular conduction delay Confirmed by Vanetta Mulders [phone]) on 03/25/2017 3:55:32 PM       Radiology Ct Head Wo Contrast  Result Date: 03/25/2017 CLINICAL DATA:  Facial numbness for 2 days EXAM: CT HEAD WITHOUT CONTRAST TECHNIQUE: Contiguous axial images were obtained from the base of the skull through the vertex without intravenous contrast. COMPARISON:  01/10/2017 FINDINGS: Brain: No evidence of acute infarction, hemorrhage, hydrocephalus, extra-axial collection or mass lesion/mass effect. Vascular: No hyperdense vessel or unexpected calcification. Skull: Normal. Negative for fracture or focal lesion. Sinuses/Orbits: No acute finding. Other: None. IMPRESSION: Normal head CT for age Electronically Signed   By: Alcide Clever M.D.   On: 03/25/2017 16:12    Procedures Procedures (including critical care time)  Medications Ordered in ED Medications  predniSONE (DELTASONE) tablet 60 mg (not administered)     Initial Impression / Assessment and Plan / ED Course  I have reviewed the triage vital signs and the nursing notes.  Pertinent labs & imaging results that were available during my care of the patient were reviewed by me and considered in my medical decision making (see chart for details).     Symptoms most consistent with Bell's palsy.  Does involve the  right forehead.  Not as completely as it does the right face.  Also gradual onset over the past 3 days more consistent with Bell's palsy.  Patient without any other neuro focal deficits.  Patient with some irritation of the right eye with tears will be started on steroids and follow-up with neurology.  Will have patient return for any new or worse symptoms.  Do not feel that this is consistent with a CVA but because of his past history of CVA did do CT head.  MRI not available.  But based on the fact that symptoms have been gradually getting worse over the past 3 days would expect something to show up of a CVA.  But the gradual onset also is more suggestive of Bell's palsy.  Final Clinical Impressions(s) / ED Diagnoses   Final diagnoses:  Bell's palsy    ED Discharge Orders        Ordered    polyvinyl alcohol (LIQUIFILM TEARS) 1.4 % ophthalmic solution  Every 2 hours     03/25/17 1839    predniSONE (DELTASONE) 10 MG tablet  Daily     03/25/17 1839       Vanetta Mulders, MD 03/25/17 [phone]

## 2017-03-25 NOTE — Discharge Instructions (Signed)
Make an appointment to follow-up with neurology.  Also did consider your wife's your neurologist in [place].  Take the prednisone as directed for the next 7 days.  First dose will be tomorrow.  Use the liquid tears in the right eye every 2 hours.  Return for any new or worse symptoms.

## 2017-03-26 MED FILL — predniSONE 10 MG TABS: 10 | 7 days supply | Qty: 28 | Fill #0

## 2017-03-27 DIAGNOSIS — G4730 Sleep apnea, unspecified: Secondary | ICD-10-CM

## 2017-03-27 NOTE — Procedures (Signed)
Patient Name: Edward Weaver, Edward Weaver Date: 03/05/2017 Gender: Male D.O.B: [birth year] Age (years): 61 Referring Provider: Cammie Sickle Height (inches): 17 Interpreting Physician: Baird Lyons MD, ABSM Weight (lbs): 267 RPSGT: Madelon Lips BMI: 43 MRN: [id number] Neck Size: 18.50 <br> <br> CLINICAL INFORMATION Sleep Study Type: Split Night CPAP  Indication for sleep study: OSA  Epworth Sleepiness Score: 6  SLEEP STUDY TECHNIQUE As per the AASM Manual for the Scoring of Sleep and Associated Events v2.3 (April 2016) with a hypopnea requiring 4% desaturations.  The channels recorded and monitored were frontal, central and occipital EEG, electrooculogram (EOG), submentalis EMG (chin), nasal and oral airflow, thoracic and abdominal wall motion, anterior tibialis EMG, snore microphone, electrocardiogram, and pulse oximetry. Continuous positive airway pressure (CPAP) was initiated when the patient met split night criteria and was titrated according to treat sleep-disordered breathing.  MEDICATIONS Medications self-administered by patient taken the night of the study : none reported  RESPIRATORY PARAMETERS Diagnostic  Total AHI (/hr): 47.6 RDI (/hr): 49.5 OA Index (/hr): 10.3 CA Index (/hr): 0.5 REM AHI (/hr): 57.9 NREM AHI (/hr): 46.3 Supine AHI (/hr): N/A Non-supine AHI (/hr): 47.62 Min O2 Sat (%): 66.00 Mean O2 (%): 92.83 Time below 88% (min): 10.3   Titration  Optimal Pressure (cm): 11 AHI at Optimal Pressure (/hr): 0.0 Min O2 at Optimal Pressure (%): 90.00 Supine % at Optimal (%): N/A Sleep % at Optimal (%): N/A   SLEEP ARCHITECTURE The recording time for the entire night was 405.1 minutes.  During a baseline period of 184.5 minutes, the patient slept for 128.5 minutes in REM and nonREM, yielding a sleep efficiency of 69.7%. Sleep onset after lights out was 36.0 minutes with a REM latency of 88.0 minutes. The patient spent 10.89% of the night in stage N1 sleep, 77.82% in stage  N2 sleep, 0.00% in stage N3 and 11.28% in REM.  During the titration period of 217.1 minutes, the patient slept for 201.6 minutes in REM and nonREM, yielding a sleep efficiency of 92.9%. Sleep onset after CPAP initiation was 0.5 minutes with a REM latency of 21.0 minutes. The patient spent 5.95% of the night in stage N1 sleep, 63.05% in stage N2 sleep, 0.00% in stage N3 and 31.00% in REM.  CARDIAC DATA The 2 lead EKG demonstrated atrial fibrillation. The mean heart rate was 58.26 beats per minute. Other EKG findings include: PVCs.  LEG MOVEMENT DATA The total Periodic Limb Movements of Sleep (PLMS) were 36. The PLMS index was 6.52 .  IMPRESSIONS - Severe obstructive sleep apnea occurred during the diagnostic portion of the study (AHI = 47.6/hour). An optimal PAP pressure was chosen: 11 cwp. - No significant central sleep apnea occurred during the diagnostic portion of the study (CAI = 0.5/hour). - Severe oxygen desaturation was noted during the diagnostic portion of the study (Min O2 = 66.00%). At final CPAP, Min sat 90%. - The patient snored with loud snoring volume during the diagnostic portion of the study. - EKG findings included atrial fibrillation. - Mild periodic limb movements occurred, without significant arousals.  DIAGNOSIS - Obstructive Sleep Apnea (327.23 [G47.33 ICD-10]) - Nocturnal Hypoxemia (327.26 [G47.36 ICD-10])  RECOMMENDATIONS - Recommend CPAP 11 or AutoPap. Patient wore a medium ResMed AirFit 20 full-face mask with heated humidity. - Avoid alcohol, sedatives and other CNS depressants that may worsen sleep apnea and disrupt normal sleep architecture. - Sleep hygiene should be reviewed to assess factors that may improve sleep quality. - Weight management and regular exercise should be initiated or  continued.  [Electronically signed] 03/27/2017 11:34 AM  Baird Lyons MD, ABSM Diplomate, American Board of Sleep Medicine   NPI: [id number]                            [place] [place] [facility]  ELECTRONICALLY SIGNED ON:  03/27/2017, 11:14 AM [place] PH: [phone]   FX: [phone] [place]

## 2017-03-31 ENCOUNTER — Telehealth: Payer: Self-pay | Admitting: Family Medicine

## 2017-03-31 NOTE — Telephone Encounter (Signed)
Reviewed notes from split-night sleep study.  Patient warrants CPAP at bedtime.  Completed forms for advanced home care to manage CPAP.   Nolon NationsLachina Moore Johnchristopher Sarvis  MSN, FNP-C Patient Care [facility] [address]  , [place] [address] [phone]

## 2017-04-06 MED FILL — DILTIAZEM 24HR ER 120 MG CA: 120 | 30 days supply | Qty: 30 | Fill #2

## 2017-04-06 MED FILL — XARELTO 20 MG TABLET: 20 | 30 days supply | Qty: 30 | Fill #3

## 2017-04-06 MED FILL — ?ATORVASTATIN 40MG TABLET: 40 | 30 days supply | Qty: 30 | Fill #2

## 2017-04-15 ENCOUNTER — Telehealth: Payer: Self-pay | Admitting: Family Medicine

## 2017-04-15 ENCOUNTER — Telehealth: Payer: Self-pay

## 2017-04-15 NOTE — Telephone Encounter (Signed)
Spoke with patient. He has never received cpap supplies. I have spoke with Terri from the Sleep center and she said they did not receive my fax from 03/31/17. I have faxed again today 04/15/2017 and have received confirmation. Thanks!

## 2017-04-15 NOTE — Telephone Encounter (Signed)
Pt called and said he had a sleep study and needed to know how he could get a machine to help him sleep.  [facility] and she said to give you the message and we could see if the patient can use our resources to get a machine.  Pt has the orange card and is applying [facility].  Thank you.

## 2017-04-20 NOTE — Telephone Encounter (Signed)
Attempted to contact the patient to provide him information regarding the American Sleep Apnea Association - CPAP assistance program. Call placed to # [phone] (M) and a HIPAA compliant voicemail message was left requesting a call back to # [phone].

## 2017-04-21 ENCOUNTER — Telehealth: Payer: Self-pay

## 2017-04-21 NOTE — Telephone Encounter (Signed)
This CM had been asked by Fay Recordsiane Boyd, [employer] Financial Counselor, to contact patient  regarding resources for obtaining a CPAP machine as he does not have any insurance.  Call received from the patient and explained to him that the American Sleep Apnea Association - CPAP Assistance Program  # [phone] provides refurbished machines for a $100 donation which is less expensive than purchasing a machine from a DME supplier. An application for the program must be completed and signed by his provider. I informed him that his provider would be notified of his request for this information. He was very appreciative and stated that he would speak to his Renato Gailsastor about helping him pay the donation.

## 2017-05-05 ENCOUNTER — Encounter: Payer: Self-pay | Admitting: Family Medicine

## 2017-05-05 ENCOUNTER — Ambulatory Visit (INDEPENDENT_AMBULATORY_CARE_PROVIDER_SITE_OTHER): Payer: Self-pay | Admitting: Family Medicine

## 2017-05-05 VITALS — BP 130/76 | HR 53 | Temp 98.0°F | Resp 16 | Ht 66.0 in | Wt 274.0 lb

## 2017-05-05 DIAGNOSIS — Z8679 Personal history of other diseases of the circulatory system: Secondary | ICD-10-CM

## 2017-05-05 DIAGNOSIS — R600 Localized edema: Secondary | ICD-10-CM

## 2017-05-05 DIAGNOSIS — R7303 Prediabetes: Secondary | ICD-10-CM

## 2017-05-05 DIAGNOSIS — G510 Bell's palsy: Secondary | ICD-10-CM

## 2017-05-05 DIAGNOSIS — G4733 Obstructive sleep apnea (adult) (pediatric): Secondary | ICD-10-CM

## 2017-05-05 MED ORDER — PREDNISONE 10 MG PO TABS
20.0000 mg | ORAL_TABLET | Freq: Every day | ORAL | 0 refills | Status: DC
Start: 2017-05-05 — End: 2017-10-15

## 2017-05-05 MED ORDER — VALACYCLOVIR HCL 1 G PO TABS: 1000.0000 mg | ORAL_TABLET | Freq: Every day | ORAL | 0 refills | Status: DC

## 2017-05-05 MED FILL — ?ATORVASTATIN 40MG TAB: 40 | 30 days supply | Qty: 30 | Fill #3

## 2017-05-05 MED FILL — ?VALACYCLOVIR HCL 1 GRAM TA: 1 | 5 days supply | Qty: 5 | Fill #0

## 2017-05-05 MED FILL — predniSONE 10 MG TABS: 10 | 10 days supply | Qty: 41 | Fill #0

## 2017-05-05 MED FILL — DILTIAZEM 24HR ER 120 MG CA: 120 | 30 days supply | Qty: 30 | Fill #3

## 2017-05-05 MED FILL — XARELTO 20 MG TABLET: 20 | 30 days supply | Qty: 30 | Fill #4

## 2017-05-05 NOTE — Patient Instructions (Addendum)
Please call [phone] to inquire about CPAP (American Sleep Apnea Association).   For Bell's Palsy, will start a trial of Prednisone 60 mg for 6 days, 30 mg/day for 3 days, and 10 mg per day for 2 days. You will take prednisone for a total of 10 days. Will also add valcyclovir 1000 mg daily for 5 days.   Recommend artificial tears to be used to lubricate the cornea  If problem persist beyond 3 months, you will need a neurologist.    Bell Palsy, Adult Bell palsy is a short-term inability to move muscles in part of the face. The inability to move (paralysis) results from inflammation or compression of the facial nerve, which travels along the skull and under the ear to the side of the face (7th cranial nerve). This nerve is responsible for facial movements that include blinking, closing the eyes, smiling, and frowning. What are the causes? The exact cause of this condition is not known. It may be caused by an infection from a virus, such as the chickenpox (herpes zoster), Epstein-Barr, or mumps virus. What increases the risk? You are more likely to develop this condition if:  You are pregnant.  You have diabetes.  You have had a recent infection in your nose, throat, or airways (upper respiratory infection).  You have a weakened body defense system (immune system).  You have had a facial injury, such as a fracture.  You have a family history of Bell palsy.  What are the signs or symptoms? Symptoms of this condition include:  Weakness on one side of the face.  Drooping eyelid and corner of the mouth.  Excessive tearing in one eye.  Difficulty closing the eyelid.  Dry eye.  Drooling.  Dry mouth.  Changes in taste.  Change in facial appearance.  Pain behind one ear.  Ringing in one or both ears.  Sensitivity to sound in one ear.  Facial twitching.  Headache.  Impaired speech.  Dizziness.  Difficulty eating or drinking.  Most of the time, only one side of  the face is affected. Rarely, Bell palsy affects the whole face. How is this diagnosed? This condition is diagnosed based on:  Your symptoms.  Your medical history.  A physical exam.  You may also have to see health care providers who specialize in disorders of the nerves (neurologist) or diseases and conditions of the eye (ophthalmologist). You may have tests, such as:  A test to check for nerve damage (electromyogram).  Imaging studies, such as CT or MRI scans.  Blood tests.  How is this treated? This condition affects every person differently. Sometimes symptoms go away without treatment within a couple weeks. If treatment is needed, it varies from person to person. The goal of treatment is to reduce inflammation and protect the eye from damage. Treatment for Bell palsy may include:  Medicines, such as: ? Steroids to reduce swelling and inflammation. ? Antiviral drugs. ? Pain relievers, including aspirin, acetaminophen, or ibuprofen.  Eye drops or ointment to keep your eye moist.  Eye protection, if you cannot close your eye.  Exercises or massage to regain muscle strength and function (physical therapy).  Follow these instructions at home:  Take over-the-counter and prescription medicines only as told by your health care provider.  If your eye is affected: ? Keep your eye moist with eye drops or ointment as told by your health care provider. ? Follow instructions for eye care and protection as told by your health care provider.  Do any physical therapy exercises as told by your health care provider.  Keep all follow-up visits as told by your health care provider. This is important. Contact a health care provider if:  You have a fever.  Your symptoms do not get better within 2-3 weeks, or your symptoms get worse.  Your eye is red, irritated, or painful.  You have new symptoms. Get help right away if:  You have weakness or numbness in a part of your body other  than your face.  You have trouble swallowing.  You develop neck pain or stiffness.  You develop dizziness or shortness of breath. Summary  Bell palsy is a short-term inability to move muscles in part of the face. The inability to move (paralysis) results from inflammation or compression of the facial nerve.  This condition affects every person differently. Sometimes symptoms go away without treatment within a couple weeks.  If treatment is needed, it varies from person to person. The goal of treatment is to reduce inflammation and protect the eye from damage.  Contact your health care provider if your symptoms do not get better within 2-3 weeks, or your symptoms get worse. This information is not intended to replace advice given to you by your health care provider. Make sure you discuss any questions you have with your health care provider. Document Released: 03/02/2005 Document Revised: 05/05/2016 Document Reviewed: 05/05/2016 Elsevier Interactive Patient Education  [employer]2018 [employer]

## 2017-05-05 NOTE — Progress Notes (Signed)
Subjective:    Patient ID: Edward Weaver, male    DOB: February 03, 1956, 62 y.o.   MRN: [id number]  HPI Edward Weaver, a 62 year old male with a history of hypertension and atrial fibrillation presents accompanied by wife for a 1 month follow up.   Atrial fibrillation  Patient has a history of atrial fibrillation. Patient also experienced left-sided numbness and twitching.  Patient reports a history of a TIA in 2014 that was diagnosed at [facility].  Patient established care on 01/27/2017 at the atrial fibrillation clinic.  During hospitalization patient was not cardioverted due to an unknown onset of atrial fibrillation.  Edward Weaver states that Edward Weaver has been taking Xarelto consistently and is also taking Cardizem 120 mg daily. Bells Palsy Edward Weaver has a history of Bell's Palsy. Patient was evaluated in the ER on 03/25/2017 with a complaint of gradual worsening or right facial weakness, excessive tearing, and mouth drooping. Patient was treated with a course of prednisone without relief.   Past Medical History:  Diagnosis Date  . Arthritis   . Atrial fibrillation (HCC)   . Bell's palsy   . Edema   . Hiatal hernia   . Hypertension   . Stroke (HCC)   . TIA (transient ischemic attack)    Nov and/or Dec 2014   Immunization History  Administered Date(s) Administered  . Influenza,inj,Quad PF,6+ Mos 01/29/2017  . Pneumococcal Polysaccharide-23 01/29/2017   Review of Systems  Constitutional: Negative.   HENT: Negative.   Eyes: Negative.   Respiratory: Positive for apnea.   Cardiovascular: Negative for chest pain and palpitations.  Gastrointestinal: Negative.  Negative for abdominal distention.  Endocrine: Negative.   Genitourinary: Negative.   Musculoskeletal: Negative.  Negative for arthralgias and back pain.  Skin: Negative.   Allergic/Immunologic: Negative.   Neurological: Positive for dizziness and numbness.  Hematological: Negative.   Psychiatric/Behavioral: Negative.         Objective:   Physical Exam  Constitutional: Edward Weaver is oriented to person, place, and time.  HENT:  Head: Normocephalic and atraumatic.  Right Ear: External ear normal.  Left Ear: External ear normal.  Nose: Nose normal.  Mouth/Throat: Oropharynx is clear and moist. Abnormal dentition.  Partially edentulous  Eyes: Conjunctivae are normal. Pupils are equal, round, and reactive to light.  Neck: Normal range of motion. Neck supple.  Cardiovascular: Normal rate, regular rhythm, normal heart sounds and intact distal pulses.  Pulmonary/Chest: Effort normal and breath sounds normal.  Abdominal: Soft. Bowel sounds are normal.  Increased abdominal obesity  Musculoskeletal: Normal range of motion.  Neurological: Edward Weaver is alert and oriented to person, place, and time. Edward Weaver has normal reflexes.  Skin: Skin is warm and dry.  Psychiatric: Edward Weaver has a normal mood and affect. His behavior is normal. Judgment and thought content normal.      BP 130/76 (BP Location: Left Arm, Patient Position: Sitting, Cuff Size: Large)   Pulse (!) 53   Temp 98 F (36.7 C) (Oral)   Resp 16   Ht 5\' 6"  (1.676 m)   Wt 274 lb (124.3 kg)   SpO2 98%   BMI 44.22 kg/m  Assessment & Plan:  OSA (obstructive sleep apnea) Advised patient to call [phone] to inquire about CPAP (American Sleep Apnea Association) per Robyne PeersJane Brazeau, case manager.   Bilateral lower extremity edema Trace edema, recommend elevating lower extremities to heart level while at rest.  - Basic Metabolic Panel   Prediabetes Hemoglobin a1C is 6.0 is consistent with prediabetes Recommend a  lowfat, low carbohydrate diet divided over 5-6 small meals, increase water intake to 6-8 glasses, and 150 minutes per week of cardiovascular exercise.   - HgB A1c   Bell's palsy - predniSONE (DELTASONE) 10 MG tablet; Take 2 tablets (20 mg total) by mouth daily with breakfast. Take 60 mg/day for 5 days, then 30 mg/day for 3 days, and 10 mg /day for 2 days.  Dispense: 41  tablet; Refill: 0 - valACYclovir (VALTREX) 1000 MG tablet; Take 1 tablet (1,000 mg total) by mouth daily.  Dispense: 5 tablet; Refill: 0  History of atrial fibrillation Continue Xarelto 20 mg as directed. Discussed medication and potential side effects at length. A full discussion of the nature of anticoagulants has been carried out.  A benefit risk analysis has been presented to the patient, so that they understand the justification for choosing anticoagulation at this time. The need for frequent and regular monitoring, precise dosage adjustment and compliance is stressed.  Side effects of potential bleeding are discussed.  The patient should avoid any OTC items containing aspirin or ibuprofen. Avoid alcohol consumption.  Call if any signs of abnormal bleeding.     RTC: 1 month for Bell's Palsy    Nolon Nations  MSN, FNP-C [facility] [address] [place], [place] [address] [phone]

## 2017-05-06 LAB — BASIC METABOLIC PANEL
BUN / CREAT RATIO: 11 (ref 10–24)
BUN: 12 mg/dL (ref 8–27)
CO2: 21 mmol/L (ref 20–29)
CREATININE: 1.05 mg/dL (ref 0.76–1.27)
Calcium: 9.5 mg/dL (ref 8.6–10.2)
Chloride: 105 mmol/L (ref 96–106)
GFR calc Af Amer: 88 mL/min/{1.73_m2} (ref >59)
GFR, EST NON AFRICAN AMERICAN: 76 mL/min/{1.73_m2} (ref >59)
GLUCOSE: 103 mg/dL — AB (ref 65–99)
POTASSIUM: 4.6 mmol/L (ref 3.5–5.2)
SODIUM: 141 mmol/L (ref 134–144)

## 2017-05-30 ENCOUNTER — Other Ambulatory Visit: Payer: Self-pay

## 2017-05-30 ENCOUNTER — Emergency Department (HOSPITAL_BASED_OUTPATIENT_CLINIC_OR_DEPARTMENT_OTHER)
Admission: EM | Admit: 2017-05-30 | Discharge: 2017-05-31 | Disposition: A | Discharge status: Home / Self Care | Payer: Self-pay | Attending: Emergency Medicine | Admitting: Emergency Medicine

## 2017-05-30 ENCOUNTER — Encounter (HOSPITAL_BASED_OUTPATIENT_CLINIC_OR_DEPARTMENT_OTHER): Payer: Self-pay | Admitting: *Deleted

## 2017-05-30 DIAGNOSIS — G510 Bell's palsy: Secondary | ICD-10-CM | POA: Insufficient documentation

## 2017-05-30 DIAGNOSIS — Z7901 Long term (current) use of anticoagulants: Secondary | ICD-10-CM | POA: Insufficient documentation

## 2017-05-30 DIAGNOSIS — H5461 Unqualified visual loss, right eye, normal vision left eye: Secondary | ICD-10-CM

## 2017-05-30 DIAGNOSIS — I10 Essential (primary) hypertension: Secondary | ICD-10-CM | POA: Insufficient documentation

## 2017-05-30 DIAGNOSIS — H33011 Retinal detachment with single break, right eye: Secondary | ICD-10-CM | POA: Insufficient documentation

## 2017-05-30 DIAGNOSIS — Z8673 Personal history of transient ischemic attack (TIA), and cerebral infarction without residual deficits: Secondary | ICD-10-CM | POA: Insufficient documentation

## 2017-05-30 DIAGNOSIS — H53131 Sudden visual loss, right eye: Secondary | ICD-10-CM | POA: Insufficient documentation

## 2017-05-30 DIAGNOSIS — H3321 Serous retinal detachment, right eye: Secondary | ICD-10-CM

## 2017-05-30 DIAGNOSIS — Z79899 Other long term (current) drug therapy: Secondary | ICD-10-CM | POA: Insufficient documentation

## 2017-05-30 MED ORDER — TETRACAINE HCL 0.5 % OP SOLN
2.0000 [drp] | Freq: Once | OPHTHALMIC | Status: AC
  Administered 2017-05-31: 2 [drp] via OPHTHALMIC
  Filled 2017-05-30: qty 4

## 2017-05-30 NOTE — ED Provider Notes (Signed)
[facility] EMERGENCY DEPARTMENT Provider Note   CSN: [id number] Arrival date & time: 05/30/17  1954     History   Chief Complaint Chief Complaint  Patient presents with  . Eye Problem    HPI Edward Weaver is a 62 y.o. male.  HPI  This is a 62 year old male with a history of atrial fibrillation on Xarelto, hypertension, stroke who presents with vision changes in his right eye.  Patient reports that he has complete vision loss in the right eye.  Over the last 2-3 days he has noted increased blurry vision out of the right eye.  At baseline he has Bell's palsy on the right since January and has increased tearing.  He noted some blurry vision over the weekend.  However, this morning he woke up.  Shortly after waking up he states "it all went black."  He does wear glasses but does not wear contact lenses.  He denies any pain in the eye.  Denies any headache.  Past Medical History:  Diagnosis Date  . Arthritis   . Atrial fibrillation (HCC)   . Bell's palsy   . Edema   . Hiatal hernia   . Hypertension   . Stroke (HCC)   . TIA (transient ischemic attack)    Nov and/or Dec 2014    Patient Active Problem List   Diagnosis Date Noted  . Neuropathy 03/08/2017  . Dizziness 03/08/2017  . History of atrial fibrillation 03/08/2017  . A-fib (HCC) 01/10/2017  . Obesity 01/10/2017  . Anxiety 08/02/2013  . Right ankle pain 08/02/2013  . TIA (transient ischemic attack) 08/02/2013  . Visit for preventive health examination 07/24/2013  . OA (osteoarthritis) of knee 07/24/2013  . Venous insufficiency of right lower extremity 07/24/2013    Past Surgical History:  Procedure Laterality Date  . ESOPHAGUS SURGERY    . KNEE SURGERY Right    1974  . TONSILLECTOMY    . WISDOM TOOTH EXTRACTION         Home Medications    Prior to Admission medications   Medication Sig Start Date End Date Taking? Authorizing Provider  atorvastatin (LIPITOR) 40 MG tablet Take 1 tablet (40 mg  total) by mouth daily at 6 PM. 02/03/17  Yes Newman Nip, NP  diltiazem (CARDIZEM CD) 120 MG 24 hr capsule Take 1 capsule (120 mg total) by mouth daily. 02/03/17  Yes Newman Nip, NP  gabapentin (NEURONTIN) 100 MG capsule Take 1 capsule (100 mg total) at bedtime by mouth. 01/29/17  Yes Massie Maroon, FNP  magnesium gluconate (MAGONATE) 500 MG tablet Take 500 mg 2 (two) times daily by mouth.   Yes [provider]  rivaroxaban (XARELTO) 20 MG TABS tablet Take 1 tablet (20 mg total) by mouth daily. 02/03/17  Yes Newman Nip, NP  polyethylene glycol (MIRALAX / GLYCOLAX) packet Take 17 g by mouth daily as needed for mild constipation. Patient not taking: Reported on 01/29/2017 01/11/17   Joseph Art, DO  predniSONE (DELTASONE) 10 MG tablet Take 2 tablets (20 mg total) by mouth daily with breakfast. Take 60 mg/day for 5 days, then 30 mg/day for 3 days, and 10 mg /day for 2 days. 05/05/17   Massie Maroon, FNP  valACYclovir (VALTREX) 1000 MG tablet Take 1 tablet (1,000 mg total) by mouth daily. 05/05/17   Massie Maroon, FNP    Family History Family History  Problem Relation Age of Onset  . Hypertension Mother  Living  . Cancer - Other Mother        Oral  . Hypertension Father   . Alzheimer's disease Father        Living  . Hypertension Brother   . Stroke Brother 62       [id number]  . Alzheimer's disease Paternal Aunt   . Stroke Paternal Aunt        #1  . Dementia Paternal Aunt        #2  . Healthy Brother        x1  . Healthy Son        x3    Social History Social History   Tobacco Use  . Smoking status: Never Smoker  . Smokeless tobacco: Never Used  Substance Use Topics  . Alcohol use: No  . Drug use: No     Allergies   Penicillins   Review of Systems Review of Systems  Constitutional: Negative for fever.  Eyes: Positive for discharge and visual disturbance. Negative for photophobia, pain and redness.  Respiratory: Negative  for shortness of breath.   Cardiovascular: Negative for chest pain.  Gastrointestinal: Negative for abdominal pain.  Neurological: Positive for facial asymmetry. Negative for dizziness, speech difficulty, weakness and numbness.  All other systems reviewed and are negative.    Physical Exam Updated Vital Signs BP 102/84 (BP Location: Right Arm)   Pulse (!) 57   Temp 98.5 F (36.9 C) (Oral)   Resp 14   Ht 5\' 6"  (1.676 m)   Wt 124.3 kg (274 lb)   SpO2 97%   BMI 44.22 kg/m   Physical Exam  Constitutional: He is oriented to person, place, and time. No distress.  Chronically ill-appearing, no acute distress  HENT:  Head: Normocephalic and atraumatic.  Facial droop noted right side  Eyes: Pupils are equal, round, and reactive to light.  Pulse 4 mm and reactive bilaterally, spontaneously extraocular movements appear intact; however, patient is unable to isolate extraocular movements with the right eye because of vision loss, unable to read fingers in any of the visual fields of his right eye, blink to threat is intact and he states he sees some motion with my fingers laterally, mild tearing noted  Cardiovascular: Normal rate, regular rhythm and intact distal pulses.  Pulmonary/Chest: Effort normal and breath sounds normal. No respiratory distress.  Abdominal: There is no tenderness.  Musculoskeletal: He exhibits no edema.  Neurological: He is alert and oriented to person, place, and time.  Facial asymmetry noted, 5 out of 5 strength in all 4 extremities, no dysmetria to finger-nose-finger  Skin: Skin is warm and dry.  Psychiatric: He has a normal mood and affect.  Nursing note and vitals reviewed.    ED Treatments / Results  Labs (all labs ordered are listed, but only abnormal results are displayed) Labs Reviewed  CBC WITH DIFFERENTIAL/PLATELET  BASIC METABOLIC PANEL    EKG  EKG Interpretation None       Radiology No results found.  Procedures Procedures (including  critical care time)   EMERGENCY DEPARTMENT US OCULAR EXAM "Study: Limited Ultrasound of Orbit "  INDICATIONS: Vision loss Linear probe utilized to obtain images in both long and short axis of the orbit having the patient look left and right if possible.  PERFORMED BY: Myself IMAGES ARCHIVED?: Yes LIMITATIONS: Emergent VIEWS USED: Right orbit INTERPRETATION: Retinal detachment present  Medications Ordered in ED Medications  tetracaine (PONTOCAINE) 0.5 % ophthalmic solution 2 drop (2 drops Right Eye Given  05/31/17 0101)     Initial Impression / Assessment and Plan / ED Course  I have reviewed the triage vital signs and the nursing notes.  Pertinent labs & imaging results that were available during my care of the patient were reviewed by me and considered in my medical decision making (see chart for details).     Patient presents with painless vision loss in the right eye.  Appears to be complete vision loss with no visual acuity in all 4 visual fields.  He does have some preserved peripheral motion vision.  Considerations include central retinal artery occlusion, retinal detachment.  Less likely glaucoma.  Bedside ultrasound is concerning for a retinal detachment.  Discussed with Dr. Sherrine Maples, ophthalmology.  Recommend follow-up at 8 AM at his office.  Patient was instructed to remain n.p.o.  I have also instructed him to not take his Xarelto tonight in case he needs retinal surgery.  Patient stated understanding.  Final Clinical Impressions(s) / ED Diagnoses   Final diagnoses:  Right retinal detachment  Vision loss of right eye    ED Discharge Orders    None       Antavious Spanos, Mayer Masker, MD 05/31/17 [phone]

## 2017-05-30 NOTE — ED Triage Notes (Signed)
Pt dx with bells palsy in January. Has right facial droop which is not new. Reports he is having a change in vision in his right eye x 3 days and wants it to be checked

## 2017-05-31 NOTE — ED Notes (Signed)
Pt given d/c instructions as per chart. Verbalizes understanding. No questions. 

## 2017-05-31 NOTE — Discharge Instructions (Signed)
You were seen today for decreased vision and blindness in the right eye.  Your exam is concerning for a retinal detachment.  It is very important that you follow-up with Dr. Sherrine MaplesGlenn tomorrow at 8 AM at his office.  Do not eat breakfast.  Also do not take your Xarelto tonight.  You may need surgery.

## 2017-06-03 ENCOUNTER — Ambulatory Visit: Payer: Self-pay | Admitting: Family Medicine

## 2017-06-10 ENCOUNTER — Other Ambulatory Visit (HOSPITAL_COMMUNITY): Payer: Self-pay | Admitting: Nurse Practitioner

## 2017-06-10 MED FILL — ?ATORVASTATIN 40MG TABLET: 40 | 30 days supply | Qty: 30 | Fill #0

## 2017-06-10 MED FILL — DILTIAZEM 24HR ER 120 MG CA: 120 | 30 days supply | Qty: 30 | Fill #0

## 2017-06-10 MED FILL — XARELTO 20 MG TABLET: 20 | 10 days supply | Qty: 10 | Fill #5

## 2017-06-24 ENCOUNTER — Ambulatory Visit: Payer: Self-pay | Admitting: Family Medicine

## 2017-07-02 ENCOUNTER — Ambulatory Visit: Payer: Self-pay | Admitting: Family Medicine

## 2017-07-15 ENCOUNTER — Other Ambulatory Visit (HOSPITAL_COMMUNITY): Payer: Self-pay | Admitting: Nurse Practitioner

## 2017-07-15 MED FILL — XARELTO 20 MG TABLET: 20 | 30 days supply | Qty: 30 | Fill #0

## 2017-07-15 MED FILL — DILTIAZEM 24HR ER 120 MG CA: 120 | 30 days supply | Qty: 30 | Fill #1

## 2017-07-15 MED FILL — ?ATORVASTATIN 40MG TABLET: 40 | 30 days supply | Qty: 30 | Fill #1

## 2017-07-21 ENCOUNTER — Ambulatory Visit: Payer: Self-pay | Admitting: Family Medicine

## 2017-07-22 ENCOUNTER — Ambulatory Visit: Payer: Self-pay | Admitting: Family Medicine

## 2017-08-12 MED FILL — XARELTO 20 MG TABLET: 20 | 30 days supply | Qty: 30 | Fill #1

## 2017-08-12 MED FILL — CARTIA XT 120 MG CAPSULE SA: 120 | 30 days supply | Qty: 30 | Fill #2

## 2017-08-12 MED FILL — ?ATORVASTATIN 40MG TABLET: 40 | 30 days supply | Qty: 30 | Fill #2

## 2017-09-13 MED FILL — ATORVASTATIN CALCIUM 40 MG: 40 | 30 days supply | Qty: 30 | Fill #3

## 2017-09-13 MED FILL — CARTIA XT 120 MG CAPSULE SA: 120 | 30 days supply | Qty: 30 | Fill #3

## 2017-09-13 MED FILL — XARELTO 20 MG TABLET: 20 | 30 days supply | Qty: 30 | Fill #2

## 2017-10-14 MED FILL — XARELTO 20 MG TABLET: 20 | 30 days supply | Qty: 30 | Fill #3

## 2017-10-15 ENCOUNTER — Ambulatory Visit (HOSPITAL_COMMUNITY)
Admission: RE | Admit: 2017-10-15 | Discharge: 2017-10-15 | Disposition: A | Discharge status: Home / Self Care | Payer: Self-pay | Source: Ambulatory Visit | Attending: Family Medicine | Admitting: Family Medicine

## 2017-10-15 ENCOUNTER — Other Ambulatory Visit: Payer: Self-pay

## 2017-10-15 ENCOUNTER — Ambulatory Visit (INDEPENDENT_AMBULATORY_CARE_PROVIDER_SITE_OTHER): Payer: Self-pay | Admitting: Family Medicine

## 2017-10-15 ENCOUNTER — Encounter: Payer: Self-pay | Admitting: Family Medicine

## 2017-10-15 VITALS — BP 140/90 | HR 77 | Temp 97.8°F | Resp 18 | Ht 66.0 in | Wt 271.0 lb

## 2017-10-15 DIAGNOSIS — R059 Cough, unspecified: Secondary | ICD-10-CM

## 2017-10-15 DIAGNOSIS — Z8679 Personal history of other diseases of the circulatory system: Secondary | ICD-10-CM

## 2017-10-15 DIAGNOSIS — Z0000 Encounter for general adult medical examination without abnormal findings: Secondary | ICD-10-CM

## 2017-10-15 DIAGNOSIS — R05 Cough: Secondary | ICD-10-CM

## 2017-10-15 DIAGNOSIS — G4730 Sleep apnea, unspecified: Secondary | ICD-10-CM

## 2017-10-15 DIAGNOSIS — Z6841 Body Mass Index (BMI) 40.0 and over, adult: Secondary | ICD-10-CM

## 2017-10-15 DIAGNOSIS — R609 Edema, unspecified: Secondary | ICD-10-CM

## 2017-10-15 MED ORDER — AMITRIPTYLINE HCL 25 MG PO TABS: 25.0000 mg | ORAL_TABLET | Freq: Every day | ORAL | 2 refills | Status: DC

## 2017-10-15 MED ORDER — ALBUTEROL SULFATE (2.5 MG/3ML) 0.083% IN NEBU
2.5000 mg | INHALATION_SOLUTION | Freq: Once | RESPIRATORY_TRACT | Status: AC
  Administered 2017-10-15: 2.5 mg via RESPIRATORY_TRACT

## 2017-10-15 MED ORDER — MAGNESIUM GLUCONATE 500 MG PO TABS: 500.0000 mg | ORAL_TABLET | Freq: Two times a day (BID) | ORAL | 2 refills | Status: DC

## 2017-10-15 MED ORDER — RIVAROXABAN 20 MG PO TABS: ORAL_TABLET | ORAL | 5 refills | Status: DC

## 2017-10-15 MED ORDER — IPRATROPIUM BROMIDE 0.02 % IN SOLN
0.5000 mg | Freq: Once | RESPIRATORY_TRACT | Status: AC
  Administered 2017-10-15: 0.5 mg via RESPIRATORY_TRACT

## 2017-10-15 MED ORDER — FUROSEMIDE 40 MG PO TABS: 40.0000 mg | ORAL_TABLET | Freq: Every day | ORAL | 3 refills | Status: DC

## 2017-10-15 MED FILL — AMITRIPTYLINE HCL 25 MG TAB: 25 | 30 days supply | Qty: 30 | Fill #0

## 2017-10-15 MED FILL — FUROSEMIDE 40 MG TAB: 40 | 30 days supply | Qty: 30 | Fill #0

## 2017-10-15 NOTE — Progress Notes (Signed)
Subjective   Edward Weaver 62 y.o. male  [id number]  [id number]  [birth year] 62 y.o.     Chief Complaint  Patient presents with  . Follow-up    Hypertension Patient is here for follow-up of elevated blood pressure.He is not exercising and is adherent to a low-salt diet. Blood pressure is well controlled at home. Cardiac symptoms: lower extremity edema. Patient denies chest pain, exertional chest pressure/discomfort, fatigue, palpitations, syncope and tachypnea.  Patient is accompanied by wife. States that he has had swelling in both legs, dizziness, and bilateral foot pain. Patient states that he was apprehensive about trying gabapentin due to the side effects.  Patient and wife state that they noticed that the Bell's Palsy has flared. They did not take valtrex or steroids after last visit. Unaware of the prescriptions.    Past Medical History:  Diagnosis Date  . Arthritis   . Atrial fibrillation (HCC)   . Bell's palsy   . Edema   . Hiatal hernia   . Hypertension   . Stroke (HCC)   . TIA (transient ischemic attack)    Nov and/or Dec 2014    Social History   Socioeconomic History  . Marital status: Married    Spouse name: Not on file  . Number of children: Not on file  . Years of education: Not on file  . Highest education level: Not on file  Occupational History  . Not on file  Social Needs  . Financial resource strain: Not on file  . Food insecurity:    Worry: Not on file    Inability: Not on file  . Transportation needs:    Medical: Not on file    Non-medical: Not on file  Tobacco Use  . Smoking status: Never Smoker  . Smokeless tobacco: Never Used  Substance and Sexual Activity  . Alcohol use: No  . Drug use: No  . Sexual activity: Yes    Birth control/protection: None  Lifestyle  . Physical activity:    Days per week: Not on file    Minutes per session: Not on file  . Stress: Not on file  Relationships  . Social connections:    Talks on  phone: Not on file    Gets together: Not on file    Attends religious service: Not on file    Active member of club or organization: Not on file    Attends meetings of clubs or organizations: Not on file    Relationship status: Not on file  . Intimate partner violence:    Fear of current or ex partner: Not on file    Emotionally abused: Not on file    Physically abused: Not on file    Forced sexual activity: Not on file  Other Topics Concern  . Not on file  Social History Narrative  . Not on file      Review of Systems  Constitutional: Negative.   HENT: Negative.   Eyes: Negative.   Respiratory: Positive for shortness of breath.   Cardiovascular: Positive for leg swelling.  Gastrointestinal: Negative.   Genitourinary: Negative.   Musculoskeletal: Negative.   Skin: Negative.   Neurological: Negative.   Psychiatric/Behavioral: Negative.     Objective   Physical Exam  Constitutional: Vital signs are normal. He appears well-developed and well-nourished. He is cooperative.  HENT:  Head: Normocephalic and atraumatic.  Right Ear: Hearing, tympanic membrane, external ear and ear canal normal.  Left Ear: Hearing, tympanic membrane, external ear  and ear canal normal.  Mouth/Throat: Uvula is midline and mucous membranes are normal. Abnormal dentition. Oropharyngeal exudate present.  Eyes: Pupils are equal, round, and reactive to light. EOM are normal.  Neck: Normal range of motion. Carotid bruit is not present. Edema (bilateral 2 + pitting edema right > left) present.  Cardiovascular: Normal rate, S1 normal and normal heart sounds. An irregularly irregular rhythm present. Exam reveals no decreased pulses (lower extremities).  Pulmonary/Chest: No respiratory distress. He has decreased breath sounds. He has wheezes in the right upper field and the left upper field.  Abdominal: Soft. Bowel sounds are normal. He exhibits no distension. There is no tenderness.  Musculoskeletal: He  exhibits edema (2+ pitting edema of LE bilaterally).  Neurological: He is alert.  Skin: Skin is warm and dry.  Psychiatric: He has a normal mood and affect. His behavior is normal. Judgment and thought content normal.    BP 140/90 (BP Location: Left Arm, Patient Position: Sitting, Cuff Size: Large)   Pulse 77   Temp 97.8 F (36.6 C) (Oral)   Resp 18   Ht 5\' 6"  (1.676 m)   Wt 271 lb (122.9 kg)   SpO2 99%   BMI 43.74 kg/m   Assessment   Encounter Diagnoses  Name Primary?  . History of atrial fibrillation Yes  . Observed sleep apnea   . Class 3 severe obesity due to excess calories with serious comorbidity and body mass index (BMI) of 40.0 to 44.9 in adult [facility](HCC)   . Healthcare maintenance   . Edema, unspecified type   . Cough      Plan  1. History of atrial fibrillation The current medical regimen is effective;  continue present plan and medications.  - Comprehensive metabolic panel  2. Observed sleep apnea Referral for discounted CPAP machine and supplies.   3. Class 3 severe obesity due to excess calories with serious comorbidity and body mass index (BMI) of 40.0 to 44.9 in adult [facility](HCC) Encouraged diet and exercise.   4. Healthcare maintenance - magnesium gluconate (MAGONATE) 500 MG tablet; Take 1 tablet (500 mg total) by mouth 2 (two) times daily.  Dispense: 60 tablet; Refill: 2  5. Edema, unspecified type Begin lasix as ordered.  - Brain natriuretic peptide - CBC with Differential - Comprehensive metabolic panel  6. Cough - DG Chest 2 View; Future - CBC with Differential - albuterol (PROVENTIL) (2.5 MG/3ML) 0.083% nebulizer solution 2.5 mg - ipratropium (ATROVENT) nebulizer solution 0.5 mg  Ms. Andr L. Riley Lamouglas, FNP-BC [facility] [address]  [place], [place] [id number] [phone]     This note has been created with Dragon speech recognition software and smart phrase technology. Any transcriptional errors are  unintentional.

## 2017-10-15 NOTE — Patient Instructions (Addendum)
I added a fluid pill call furosimide (Lasix). You will take this every morning. I ordered a chest xray and lab work. I also added a medication to take at night called elavil. This is to help with pain and sleep.     Hypertension Hypertension is another name for high blood pressure. High blood pressure forces your heart to work harder to pump blood. This can cause problems over time. There are two numbers in a blood pressure reading. There is a top number (systolic) over a bottom number (diastolic). It is best to have a blood pressure below 120/80. Healthy choices can help lower your blood pressure. You may need medicine to help lower your blood pressure if:  Your blood pressure cannot be lowered with healthy choices.  Your blood pressure is higher than 130/80.  Follow these instructions at home: Eating and drinking  If directed, follow the DASH eating plan. This diet includes: ? Filling half of your plate at each meal with fruits and vegetables. ? Filling one quarter of your plate at each meal with whole grains. Whole grains include whole wheat pasta, brown rice, and whole grain bread. ? Eating or drinking low-fat dairy products, such as skim milk or low-fat yogurt. ? Filling one quarter of your plate at each meal with low-fat (lean) proteins. Low-fat proteins include fish, skinless chicken, eggs, beans, and tofu. ? Avoiding fatty meat, cured and processed meat, or chicken with skin. ? Avoiding premade or processed food.  Eat less than 1,500 mg of salt (sodium) a day.  Limit alcohol use to no more than 1 drink a day for nonpregnant women and 2 drinks a day for men. One drink equals 12 oz of beer, 5 oz of wine, or 1 oz of hard liquor. Lifestyle  Work with your doctor to stay at a healthy weight or to lose weight. Ask your doctor what the best weight is for you.  Get at least 30 minutes of exercise that causes your heart to beat faster (aerobic exercise) most days of the week. This may  include walking, swimming, or biking.  Get at least 30 minutes of exercise that strengthens your muscles (resistance exercise) at least 3 days a week. This may include lifting weights or pilates.  Do not use any products that contain nicotine or tobacco. This includes cigarettes and e-cigarettes. If you need help quitting, ask your doctor.  Check your blood pressure at home as told by your doctor.  Keep all follow-up visits as told by your doctor. This is important. Medicines  Take over-the-counter and prescription medicines only as told by your doctor. Follow directions carefully.  Do not skip doses of blood pressure medicine. The medicine does not work as well if you skip doses. Skipping doses also puts you at risk for problems.  Ask your doctor about side effects or reactions to medicines that you should watch for. Contact a doctor if:  You think you are having a reaction to the medicine you are taking.  You have headaches that keep coming back (recurring).  You feel dizzy.  You have swelling in your ankles.  You have trouble with your vision. Get help right away if:  You get a very bad headache.  You start to feel confused.  You feel weak or numb.  You feel faint.  You get very bad pain in your: ? Chest. ? Belly (abdomen).  You throw up (vomit) more than once.  You have trouble breathing. Summary  Hypertension is another  name for high blood pressure.  Making healthy choices can help lower blood pressure. If your blood pressure cannot be controlled with healthy choices, you may need to take medicine. This information is not intended to replace advice given to you by your health care provider. Make sure you discuss any questions you have with your health care provider. Document Released: 08/19/2007 Document Revised: 01/29/2016 Document Reviewed: 01/29/2016 Elsevier Interactive Patient Education  2018 [employer] Atrial Fibrillation Atrial fibrillation is a  type of heartbeat that is irregular or fast (rapid). If you have this condition, your heart keeps quivering in a weird (chaotic) way. This condition can make it so your heart cannot pump blood normally. Having this condition gives a person more risk for stroke, heart failure, and other heart problems. There are different types of atrial fibrillation. Talk with your doctor to learn about the type that you have. Follow these instructions at home:  Take over-the-counter and prescription medicines only as told by your doctor.  If your doctor prescribed a blood-thinning medicine, take it exactly as told. Taking too much of it can cause bleeding. If you do not take enough of it, you will not have the protection that you need against stroke and other problems.  Do not use any tobacco products. These include cigarettes, chewing tobacco, and e-cigarettes. If you need help quitting, ask your doctor.  If you have apnea (obstructive sleep apnea), manage it as told by your doctor.  Do not drink alcohol.  Do not drink beverages that have caffeine. These include coffee, soda, and tea.  Maintain a healthy weight. Do not use diet pills unless your doctor says they are safe for you. Diet pills may make heart problems worse.  Follow diet instructions as told by your doctor.  Exercise regularly as told by your doctor.  Keep all follow-up visits as told by your doctor. This is important. Contact a doctor if:  You notice a change in the speed, rhythm, or strength of your heartbeat.  You are taking a blood-thinning medicine and you notice more bruising.  You get tired more easily when you move or exercise. Get help right away if:  You have pain in your chest or your belly (abdomen).  You have sweating or weakness.  You feel sick to your stomach (nauseous).  You notice blood in your throw up (vomit), poop (stool), or pee (urine).  You are short of breath.  You suddenly have swollen feet and  ankles.  You feel dizzy.  Your suddenly get weak or numb in your face, arms, or legs, especially if it happens on one side of your body.  You have trouble talking, trouble understanding, or both.  Your face or your eyelid droops on one side. These symptoms may be an emergency. Do not wait to see if the symptoms will go away. Get medical help right away. Call your local emergency services (911 in the [place] Do not drive yourself to the hospital. This information is not intended to replace advice given to you by your health care provider. Make sure you discuss any questions you have with your health care provider. Document Released: 12/10/2007 Document Revised: 08/08/2015 Document Reviewed: 06/27/2014 Elsevier Interactive Patient Education  [employer]2018 [employer] Inc.

## 2017-10-16 LAB — CBC WITH DIFFERENTIAL/PLATELET
Basophils Absolute: 0.1 10*3/uL (ref 0.0–0.2)
Basos: 2 %
EOS (ABSOLUTE): 0.4 10*3/uL (ref 0.0–0.4)
Eos: 6 %
Hematocrit: 45.1 % (ref 37.5–51.0)
Hemoglobin: 14.8 g/dL (ref 13.0–17.7)
Immature Grans (Abs): 0 10*3/uL (ref 0.0–0.1)
Immature Granulocytes: 0 %
Lymphocytes Absolute: 2.2 10*3/uL (ref 0.7–3.1)
Lymphs: 37 %
MCH: 28.4 pg (ref 26.6–33.0)
MCHC: 32.8 g/dL (ref 31.5–35.7)
MCV: 87 fL (ref 79–97)
Monocytes Absolute: 0.5 10*3/uL (ref 0.1–0.9)
Monocytes: 8 %
Neutrophils Absolute: 2.8 10*3/uL (ref 1.4–7.0)
Neutrophils: 47 %
Platelets: 302 10*3/uL (ref 150–450)
RBC: 5.21 x10E6/uL (ref 4.14–5.80)
RDW: 13.3 % (ref 12.3–15.4)
WBC: 6 10*3/uL (ref 3.4–10.8)

## 2017-10-16 LAB — COMPREHENSIVE METABOLIC PANEL
ALT: 24 IU/L (ref 0–44)
AST: 9 IU/L (ref 0–40)
Albumin/Globulin Ratio: 1.6 (ref 1.2–2.2)
Albumin: 4.2 g/dL (ref 3.6–4.8)
Alkaline Phosphatase: 76 IU/L (ref 39–117)
BUN/Creatinine Ratio: 15 (ref 10–24)
BUN: 16 mg/dL (ref 8–27)
Bilirubin Total: 0.5 mg/dL (ref 0.0–1.2)
CO2: 22 mmol/L (ref 20–29)
Calcium: 9.7 mg/dL (ref 8.6–10.2)
Chloride: 103 mmol/L (ref 96–106)
Creatinine, Ser: 1.06 mg/dL (ref 0.76–1.27)
GFR calc Af Amer: 87 mL/min/{1.73_m2} (ref >59)
GFR calc non Af Amer: 75 mL/min/{1.73_m2} (ref >59)
Globulin, Total: 2.6 g/dL (ref 1.5–4.5)
Glucose: 104 mg/dL — ABNORMAL HIGH (ref 65–99)
Potassium: 4.5 mmol/L (ref 3.5–5.2)
Sodium: 140 mmol/L (ref 134–144)
Total Protein: 6.8 g/dL (ref 6.0–8.5)

## 2017-10-16 LAB — BRAIN NATRIURETIC PEPTIDE: BNP: 97.3 pg/mL (ref 0.0–100.0)

## 2017-10-16 NOTE — Progress Notes (Signed)
Labs normal. No medication changes at this time.

## 2017-10-18 ENCOUNTER — Telehealth (HOSPITAL_COMMUNITY): Payer: Self-pay

## 2017-10-18 ENCOUNTER — Other Ambulatory Visit: Payer: Self-pay

## 2017-10-18 ENCOUNTER — Telehealth: Payer: Self-pay

## 2017-10-18 DIAGNOSIS — G510 Bell's palsy: Secondary | ICD-10-CM

## 2017-10-18 DIAGNOSIS — Z0000 Encounter for general adult medical examination without abnormal findings: Secondary | ICD-10-CM

## 2017-10-18 DIAGNOSIS — G629 Polyneuropathy, unspecified: Secondary | ICD-10-CM

## 2017-10-18 DIAGNOSIS — F411 Generalized anxiety disorder: Secondary | ICD-10-CM

## 2017-10-18 MED ORDER — ATORVASTATIN CALCIUM 40 MG PO TABS: 40.0000 mg | ORAL_TABLET | Freq: Every day | ORAL | 3 refills | Status: DC

## 2017-10-18 MED ORDER — MAGNESIUM GLUCONATE 500 MG PO TABS: 500.0000 mg | ORAL_TABLET | Freq: Two times a day (BID) | ORAL | 2 refills | Status: DC

## 2017-10-18 MED ORDER — DILTIAZEM HCL ER COATED BEADS 120 MG PO CP24: 120.0000 mg | ORAL_CAPSULE | Freq: Every day | ORAL | 3 refills | Status: DC

## 2017-10-18 MED ORDER — GABAPENTIN 100 MG PO CAPS: 100.0000 mg | ORAL_CAPSULE | Freq: Every day | ORAL | 3 refills | Status: DC

## 2017-10-18 MED FILL — GABAPENTIN 100 MG CAPSULE: 100 | 30 days supply | Qty: 30 | Fill #0

## 2017-10-18 MED FILL — DILTIAZEM 24HR CD 120 MG CA: 120 | 30 days supply | Qty: 30 | Fill #0

## 2017-10-18 MED FILL — ATORVASTATIN CALCIUM 40 MG: 40 | 30 days supply | Qty: 30 | Fill #0

## 2017-10-18 NOTE — Telephone Encounter (Signed)
Spoke with Mr. Edward Weaver regarding a CPAP machine that is being shipped to him form the [employer] [facility] with his prescription of CPAP 11cmH2O.  Instructed patient to come in to office and ask for me after he receives the machine.

## 2017-10-18 NOTE — Telephone Encounter (Signed)
Medications sent to pharmacy. Thanks!

## 2017-10-18 NOTE — Telephone Encounter (Signed)
Called and left a message that chest xray was normal. Asked that patient continue daily fluid pills and keep next scheduled appointment. Asked to call back if any questions. Thanks!

## 2017-10-18 NOTE — Telephone Encounter (Signed)
-----   Message from Mike GipAndre Douglas, FNP sent at 10/16/2017  4:49 PM EDT ----- Chest X-ray is normal. Please keep your follow up appointment and take the fluid pills every day.

## 2017-10-29 ENCOUNTER — Encounter (HOSPITAL_COMMUNITY): Payer: Self-pay

## 2017-10-29 ENCOUNTER — Ambulatory Visit (INDEPENDENT_AMBULATORY_CARE_PROVIDER_SITE_OTHER): Payer: Self-pay | Admitting: Family Medicine

## 2017-10-29 ENCOUNTER — Encounter: Payer: Self-pay | Admitting: Family Medicine

## 2017-10-29 VITALS — BP 101/80 | HR 81 | Temp 98.3°F | Resp 18 | Ht 66.0 in | Wt 270.0 lb

## 2017-10-29 DIAGNOSIS — Z8719 Personal history of other diseases of the digestive system: Secondary | ICD-10-CM

## 2017-10-29 DIAGNOSIS — K5900 Constipation, unspecified: Secondary | ICD-10-CM

## 2017-10-29 MED ORDER — POTASSIUM CHLORIDE ER 10 MEQ PO TBCR: 10.0000 meq | EXTENDED_RELEASE_TABLET | Freq: Every day | ORAL | 2 refills | Status: DC

## 2017-10-29 NOTE — Progress Notes (Signed)
Patient ID: Edward Weaver, male   DOB: 04/05/1955, 62 y.o.   MRN: [id number]  Patient presented to the clinic to pick up his CPAP machine acquired through the [employer] [facility].  Fitted with medium mask, machine powered on and pressure ramped from 4 to 11.  No leaks noted.  Explained maintenance procedures and replacement cycle for cushions, masks, tubing.  Demonstrated turning humidity off and on and explained that only use of distilled water is recommended.  Patient verbalized understanding.  Contact information for the [employer] provided for patient to receive replacement supplies.    CPAP Assistance Program: For all questions about our CPAP Assistance Program, and to check application or program fee status. Telephone: [phone] Fax: [phone] email: [email or web address]  Patient verbalized understanding.  Encouraged to contact us for questions as needed.

## 2017-10-29 NOTE — Patient Instructions (Signed)

## 2017-10-29 NOTE — Progress Notes (Signed)
[facility]   Progress Note: General Provider: Mike GipAndre Rhilee Currin, FNP  SUBJECTIVE:   Chief Complaint  Patient presents with  . Follow-up  . Blood In Stools    Patient presents with wife today for follow up on elavil. Patient states that he has not started taking the medication d/t not having his CPAP machine. Patient states that he will start the medications tonight. Patient with improvement of lower extremity edema and SOB since starting lasix Patient with a hx of hemmorhoids. Patient states he ate a large amount of dairy and was constipated. Wiped yesterday and blood present on tissue. No blood this AM. Patient's wife states that he has had several black stools prior to starting Xarelto. Also has runny stools daily.    Past Medical History:  Diagnosis Date  . Arthritis   . Atrial fibrillation (HCC)   . Bell's palsy   . Edema   . Hiatal hernia   . Hypertension   . Stroke (HCC)   . TIA (transient ischemic attack)    Nov and/or Dec 2014    Social History   Socioeconomic History  . Marital status: Married    Spouse name: Not on file  . Number of children: Not on file  . Years of education: Not on file  . Highest education level: Not on file  Occupational History  . Not on file  Social Needs  . Financial resource strain: Not on file  . Food insecurity:    Worry: Not on file    Inability: Not on file  . Transportation needs:    Medical: Not on file    Non-medical: Not on file  Tobacco Use  . Smoking status: Never Smoker  . Smokeless tobacco: Never Used  Substance and Sexual Activity  . Alcohol use: No  . Drug use: No  . Sexual activity: Yes    Birth control/protection: None  Lifestyle  . Physical activity:    Days per week: Not on file    Minutes per session: Not on file  . Stress: Not on file  Relationships  . Social connections:    Talks on phone: Not on file    Gets together: Not on file    Attends religious  service: Not on file    Active member of club or organization: Not on file    Attends meetings of clubs or organizations: Not on file    Relationship status: Not on file  . Intimate partner violence:    Fear of current or ex partner: Not on file    Emotionally abused: Not on file    Physically abused: Not on file    Forced sexual activity: Not on file  Other Topics Concern  . Not on file  Social History Narrative  . Not on file      Review of Systems  Constitutional: Negative.   Respiratory: Negative.   Cardiovascular: Negative.   Gastrointestinal: Positive for blood in stool and constipation.  Neurological: Negative.   Psychiatric/Behavioral: Negative.     OBJECTIVE: BP 101/80 (BP Location: Right Arm, Patient Position: Sitting, Cuff Size: Large)   Pulse 81   Temp 98.3 F (36.8 C) (Oral)   Resp 18   Ht 5\' 6"  (1.676 m)   Wt 270 lb (122.5 kg)   SpO2 97%   BMI 43.58 kg/m   Physical Exam  Constitutional: He is oriented to person, place, and time. He appears well-developed and well-nourished. No distress.  HENT:  Head: Normocephalic and atraumatic.  Cardiovascular: Normal rate. An irregularly irregular rhythm present.  No murmur heard. Pulmonary/Chest: Effort normal and breath sounds normal. No respiratory distress.  Abdominal: Soft. Bowel sounds are normal. There is no tenderness.  Declined rectal exam  Musculoskeletal: He exhibits no edema.  Neurological: He is alert and oriented to person, place, and time.  Psychiatric: He has a normal mood and affect. His behavior is normal. Judgment and thought content normal.  Nursing note and vitals reviewed.   ASSESSMENT/PLAN:   1. History of melena  - Ambulatory referral to Gastroenterology  2. Constipation, unspecified constipation type The patient is asked to make an attempt to improve diet and exercise patterns to aid in medical management of this problem.  - Ambulatory referral to Gastroenterology  3. Hx of  hemorrhoids - Ambulatory referral to Gastroenterology  Advised if bright red blood occurs, stomach cramps, fever, nausea or coffee stools occur, go to ED.    The patient was given clear instructions to go to ER or return to medical center if symptoms do not improve, worsen or new problems develop. The patient verbalized understanding and agreed with plan of care.   Ms. Freda Jacksonndr L. Riley Lamouglas, FNP-BC [facility] [address]  [place], [place] [id number] [phone]     This note has been created with Dragon speech recognition software and smart phrase technology. Any transcriptional errors are unintentional.

## 2017-11-17 MED FILL — ATORVASTATIN CALCIUM 40 MG: 40 | 30 days supply | Qty: 30 | Fill #1

## 2017-11-17 MED FILL — DILTIAZEM 24HR CD 120 MG CA: 120 | 30 days supply | Qty: 30 | Fill #1

## 2017-11-25 ENCOUNTER — Ambulatory Visit: Payer: Self-pay

## 2017-12-27 MED FILL — XARELTO 20 MG TABLET: 20 | 30 days supply | Qty: 30 | Fill #0

## 2017-12-27 MED FILL — ATORVASTATIN CALCIUM 40 MG: 40 | 30 days supply | Qty: 30 | Fill #2

## 2017-12-27 MED FILL — DILTIAZEM 24HR CD 120 MG CA: 120 | 30 days supply | Qty: 30 | Fill #2

## 2018-01-04 ENCOUNTER — Encounter: Payer: Self-pay | Admitting: Family Medicine

## 2018-01-31 ENCOUNTER — Ambulatory Visit: Payer: Self-pay | Admitting: Family Medicine

## 2018-02-13 ENCOUNTER — Other Ambulatory Visit: Payer: Self-pay

## 2018-02-13 ENCOUNTER — Encounter (HOSPITAL_BASED_OUTPATIENT_CLINIC_OR_DEPARTMENT_OTHER): Payer: Self-pay | Admitting: *Deleted

## 2018-02-13 ENCOUNTER — Emergency Department (HOSPITAL_BASED_OUTPATIENT_CLINIC_OR_DEPARTMENT_OTHER)
Admission: EM | Admit: 2018-02-13 | Discharge: 2018-02-13 | Disposition: A | Discharge status: Home / Self Care | Payer: Self-pay | Attending: Emergency Medicine | Admitting: Emergency Medicine

## 2018-02-13 DIAGNOSIS — I110 Hypertensive heart disease with heart failure: Secondary | ICD-10-CM | POA: Insufficient documentation

## 2018-02-13 DIAGNOSIS — Z79899 Other long term (current) drug therapy: Secondary | ICD-10-CM | POA: Insufficient documentation

## 2018-02-13 DIAGNOSIS — Z8673 Personal history of transient ischemic attack (TIA), and cerebral infarction without residual deficits: Secondary | ICD-10-CM | POA: Insufficient documentation

## 2018-02-13 DIAGNOSIS — Z20828 Contact with and (suspected) exposure to other viral communicable diseases: Secondary | ICD-10-CM | POA: Insufficient documentation

## 2018-02-13 DIAGNOSIS — Z7901 Long term (current) use of anticoagulants: Secondary | ICD-10-CM | POA: Insufficient documentation

## 2018-02-13 DIAGNOSIS — I509 Heart failure, unspecified: Secondary | ICD-10-CM | POA: Insufficient documentation

## 2018-02-13 MED ORDER — OSELTAMIVIR PHOSPHATE 75 MG PO CAPS: 75.0000 mg | ORAL_CAPSULE | Freq: Two times a day (BID) | ORAL | 0 refills | Status: DC

## 2018-02-13 NOTE — Discharge Instructions (Addendum)
You do not have flu or flulike symptoms at the moment.  You might have been exposed to people who have had flu.  We are sending you home with prescription for Tamiflu that you need to take only if you start developing flulike illness.  Please read the information on flu. Please be aware, that Tamiflu does not cure influenza, it only reduces the symptoms by 24 hours.  Therefore it is quite okay if you decide not to take the Tamiflu and just manage the symptoms of flu such as fevers, chills, cough, body aches.

## 2018-02-13 NOTE — ED Notes (Signed)
Drink  given at d/c.

## 2018-02-13 NOTE — ED Notes (Signed)
ED Provider at bedside. 

## 2018-02-13 NOTE — ED Provider Notes (Signed)
[facility] EMERGENCY DEPARTMENT Provider Note   CSN: [id number] Arrival date & time: 02/13/18  0058     History   Chief Complaint Chief Complaint  Patient presents with  . Check-up    HPI Edward Weaver is a 62 y.o. male.  HPI  62 year old male comes in with chief complaint of flu discerns patient has history of A. Fib.  And reports that over the Thanksgiving weekend he refers exposed to grandkids who were later diagnosed with flu.  Patient does not have any cough, shortness of breath, wheezing, URI-like symptoms, body aches, fevers, chills.  Past Medical History:  Diagnosis Date  . Arthritis   . Atrial fibrillation (HCC)   . Bell's palsy   . Edema   . Hiatal hernia   . Hypertension   . Stroke (HCC)   . TIA (transient ischemic attack)    Nov and/or Dec 2014    Patient Active Problem List   Diagnosis Date Noted  . Neuropathy 03/08/2017  . Dizziness 03/08/2017  . History of atrial fibrillation 03/08/2017  . A-fib (HCC) 01/10/2017  . Obesity 01/10/2017  . Anxiety 08/02/2013  . Right ankle pain 08/02/2013  . TIA (transient ischemic attack) 08/02/2013  . Visit for preventive health examination 07/24/2013  . OA (osteoarthritis) of knee 07/24/2013  . Venous insufficiency of right lower extremity 07/24/2013    Past Surgical History:  Procedure Laterality Date  . ESOPHAGUS SURGERY    . KNEE SURGERY Right    1974  . TONSILLECTOMY    . WISDOM TOOTH EXTRACTION          Home Medications    Prior to Admission medications   Medication Sig Start Date End Date Taking? Authorizing Provider  amitriptyline (ELAVIL) 25 MG tablet Take 1 tablet (25 mg total) by mouth at bedtime. 10/15/17   Mike Gip, FNP  atorvastatin (LIPITOR) 40 MG tablet Take 1 tablet (40 mg total) by mouth daily at 6 PM. 10/18/17   Mike Gip, FNP  atorvastatin (LIPITOR) 40 MG tablet Take 1 tablet (40 mg total) by mouth daily at 6 PM. 10/18/17   Mike Gip, FNP  diltiazem (CARDIZEM  CD) 120 MG 24 hr capsule Take 1 capsule (120 mg total) by mouth daily. 10/18/17   Mike Gip, FNP  furosemide (LASIX) 40 MG tablet Take 1 tablet (40 mg total) by mouth daily. 10/15/17   Mike Gip, FNP  gabapentin (NEURONTIN) 100 MG capsule Take 1 capsule (100 mg total) by mouth at bedtime. 10/18/17   Mike Gip, FNP  magnesium gluconate (MAGONATE) 500 MG tablet Take 1 tablet (500 mg total) by mouth 2 (two) times daily. 10/18/17   Mike Gip, FNP  oseltamivir (TAMIFLU) 75 MG capsule Take 1 capsule (75 mg total) by mouth every 12 (twelve) hours. 02/13/18   Derwood Kaplan, MD  potassium chloride (KLOR-CON 10) 10 MEQ tablet Take 1 tablet (10 mEq total) by mouth daily. 10/29/17   Mike Gip, FNP  rivaroxaban (XARELTO) 20 MG TABS tablet TAKE 1 TABLET (20 MG TOTAL) BY MOUTH DAILY. 10/15/17   Mike Gip, FNP  valACYclovir (VALTREX) 1000 MG tablet Take 1 tablet (1,000 mg total) by mouth daily. 05/05/17   Massie Maroon, FNP    Family History Family History  Problem Relation Age of Onset  . Hypertension Mother        Living  . Cancer - Other Mother        Oral  . Hypertension Father   . Alzheimer's disease Father  Living  . Hypertension Brother   . Stroke Brother [id number]       Deceased  . Alzheimer's disease Paternal Aunt   . Stroke Paternal Aunt        #1  . Dementia Paternal Aunt        #2  . Healthy Brother        x1  . Healthy Son        x3    Social History Social History   Tobacco Use  . Smoking status: Never Smoker  . Smokeless tobacco: Never Used  Substance Use Topics  . Alcohol use: No  . Drug use: No     Allergies   Penicillins   Review of Systems Review of Systems  Constitutional: Negative for activity change, chills and fever.  Respiratory: Negative for shortness of breath.   Gastrointestinal: Negative for nausea.  Allergic/Immunologic: Negative for immunocompromised state.  All other systems reviewed and are negative.    Physical  Exam Updated Vital Signs BP (!) 139/99 (BP Location: Right Arm)   Pulse 84   Temp 98.4 F (36.9 C) (Oral)   Resp 16   Ht 5\' 6"  (1.676 m)   Wt 122.5 kg   SpO2 100%   BMI 43.58 kg/m   Physical Exam  Constitutional: He is oriented to person, place, and time. He appears well-developed.  HENT:  Head: Atraumatic.  Neck: Neck supple.  Cardiovascular: Normal rate.  Pulmonary/Chest: Effort normal.  Neurological: He is alert and oriented to person, place, and time.  Skin: Skin is warm.  Nursing note and vitals reviewed.    ED Treatments / Results  Labs (all labs ordered are listed, but only abnormal results are displayed) Labs Reviewed - No data to display  EKG None  Radiology No results found.  Procedures Procedures (including critical care time)  Medications Ordered in ED Medications - No data to display   Initial Impression / Assessment and Plan / ED Course  I have reviewed the triage vital signs and the nursing notes.  Pertinent labs & imaging results that were available during my care of the patient were reviewed by me and considered in my medical decision making (see chart for details).     62 year old male who does not have any known CAD, CHF, COPD and is immunocompetent comes in a chief complaint of exposure to children with flu. Has no flulike symptoms. We have advised him that he does not need to be tested for flu nor does need to start taking any medication for flu.  His significant other is also in the ER, and she is having flulike illness -we will send him home with Tamiflu that he can take if he starts developing symptoms.  Patient has been made aware of the pros and cons of Tamiflu use, and he is aware that he does not have to take the Tamiflu if he is able to manage.  Final Clinical Impressions(s) / ED Diagnoses   Final diagnoses:  Exposure to the flu    ED Discharge Orders         Ordered    oseltamivir (TAMIFLU) 75 MG capsule  Every 12 hours      02/13/18 0326           Derwood KaplanNanavati, Rindi Beechy, MD 02/13/18 [phone]

## 2018-02-13 NOTE — ED Triage Notes (Signed)
Pt reports his grandchildren have the flu and he would like to be tested to see if he has it. Denies fever, body aches, chills or other Sx

## 2018-02-14 MED FILL — DILTIAZEM 24HR ER 120 MG CA: 120 | 30 days supply | Qty: 30 | Fill #3

## 2018-02-14 MED FILL — ATORVASTATIN CALCIUM 40 MG: 40 | 30 days supply | Qty: 30 | Fill #3

## 2018-02-15 MED FILL — OSELTAMIVIR PHOSPHATE 75 MG: 75 | 5 days supply | Qty: 10 | Fill #0

## 2018-02-15 MED FILL — XARELTO 20 MG TABLET: 20 | 30 days supply | Qty: 30 | Fill #1

## 2018-03-07 ENCOUNTER — Telehealth (HOSPITAL_COMMUNITY): Payer: Self-pay | Admitting: *Deleted

## 2018-03-07 NOTE — Telephone Encounter (Signed)
Patient called in stating he is having increased shortness of breath and lower extremity swelling. Pt does not weigh himself daily. Pt will try taking an extra lasix this afternoon and call in with report tomorrow of fluid status. Pt instructed to start weighing himself everyday to help with fluid recognition.

## 2018-03-17 ENCOUNTER — Ambulatory Visit (HOSPITAL_COMMUNITY)
Admission: RE | Admit: 2018-03-17 | Discharge: 2018-03-17 | Disposition: A | Discharge status: Home / Self Care | Payer: Self-pay | Source: Ambulatory Visit | Attending: Nurse Practitioner | Admitting: Nurse Practitioner

## 2018-03-17 ENCOUNTER — Encounter (HOSPITAL_COMMUNITY): Payer: Self-pay | Admitting: Nurse Practitioner

## 2018-03-17 ENCOUNTER — Other Ambulatory Visit: Payer: Self-pay | Admitting: Family Medicine

## 2018-03-17 VITALS — BP 146/90 | HR 66 | Ht 66.0 in | Wt 279.0 lb

## 2018-03-17 DIAGNOSIS — I4819 Other persistent atrial fibrillation: Secondary | ICD-10-CM

## 2018-03-17 DIAGNOSIS — Z6841 Body Mass Index (BMI) 40.0 and over, adult: Secondary | ICD-10-CM | POA: Insufficient documentation

## 2018-03-17 DIAGNOSIS — Z8249 Family history of ischemic heart disease and other diseases of the circulatory system: Secondary | ICD-10-CM | POA: Insufficient documentation

## 2018-03-17 DIAGNOSIS — Z8673 Personal history of transient ischemic attack (TIA), and cerebral infarction without residual deficits: Secondary | ICD-10-CM | POA: Insufficient documentation

## 2018-03-17 DIAGNOSIS — Z79899 Other long term (current) drug therapy: Secondary | ICD-10-CM | POA: Insufficient documentation

## 2018-03-17 DIAGNOSIS — I4891 Unspecified atrial fibrillation: Secondary | ICD-10-CM | POA: Insufficient documentation

## 2018-03-17 DIAGNOSIS — R9431 Abnormal electrocardiogram [ECG] [EKG]: Secondary | ICD-10-CM | POA: Insufficient documentation

## 2018-03-17 DIAGNOSIS — Z823 Family history of stroke: Secondary | ICD-10-CM | POA: Insufficient documentation

## 2018-03-17 DIAGNOSIS — Z880 Allergy status to penicillin: Secondary | ICD-10-CM | POA: Insufficient documentation

## 2018-03-17 DIAGNOSIS — I10 Essential (primary) hypertension: Secondary | ICD-10-CM | POA: Insufficient documentation

## 2018-03-17 DIAGNOSIS — E669 Obesity, unspecified: Secondary | ICD-10-CM | POA: Insufficient documentation

## 2018-03-17 NOTE — Progress Notes (Signed)
Primary Care Physician: Mike Gipouglas, Andre, FNP Referring Physician: [facility]    Edward Weaver is a 63 y.o. male with a h/o obesity,htn,(untreated) previous TIA (dx 2014), carotid disease, not followed by a PCP, that presented to the ER with new onset afib although per ex wife, he has been symptomatic with dyspnea, fatigue, swelling for months. He was started on Cardizem and xarelto  He was not cardioverted as to unknown onset of afib.   In the afib office, he is now in rate controlled afib at 65 bpm. He is feeling better.  Does not smoke or use alcohol. Does snore with apnea episodes. LLE is improving and is at trace levels today. He is unemployed  and does not have [employer] insurance. SW in to talk to pt.   F/u in afib office, 03/17/18. Pt on last visit, ( 01/2017) here was going to apply for orange card as he did not have insurance and I never heard back from him for f/u.He did get established with PCP and has stayed on xarelto. He has noted more shortness of breath. He did get orange card but it has now lapsed and he has to get it renewed. He did get a sleep study and is using cpap.   Today, he denies symptoms of palpitations, chest pain, shortness of breath, orthopnea, PND, lower extremity edema, dizziness, presyncope, syncope, or neurologic sequela. The patient is tolerating medications without difficulties and is otherwise without complaint today.   Past Medical History:  Diagnosis Date  . Arthritis   . Atrial fibrillation (HCC)   . Bell's palsy   . Edema   . Hiatal hernia   . Hypertension   . Stroke (HCC)   . TIA (transient ischemic attack)    Nov and/or Dec 2014   Past Surgical History:  Procedure Laterality Date  . ESOPHAGUS SURGERY    . KNEE SURGERY Right    1974  . TONSILLECTOMY    . WISDOM TOOTH EXTRACTION      Current Outpatient Medications  Medication Sig Dispense Refill  . amitriptyline (ELAVIL) 25 MG tablet Take 1 tablet (25 mg total) by mouth at bedtime. 30 tablet  2  . atorvastatin (LIPITOR) 40 MG tablet Take 1 tablet (40 mg total) by mouth daily at 6 PM. 30 tablet 3  . diltiazem (CARDIZEM CD) 120 MG 24 hr capsule TAKE 1 CAPSULE BY MOUTH DAILY. 30 capsule 3  . furosemide (LASIX) 40 MG tablet Take 1 tablet (40 mg total) by mouth daily. 30 tablet 3  . magnesium gluconate (MAGONATE) 500 MG tablet Take 1 tablet (500 mg total) by mouth 2 (two) times daily. (Patient taking differently: Take 500 mg by mouth 2 (two) times daily. Taking sporadically) 60 tablet 2  . rivaroxaban (XARELTO) 20 MG TABS tablet TAKE 1 TABLET (20 MG TOTAL) BY MOUTH DAILY. 30 tablet 5  . valACYclovir (VALTREX) 1000 MG tablet Take 1 tablet (1,000 mg total) by mouth daily. 5 tablet 0   No current facility-administered medications for this encounter.     Allergies  Allergen Reactions  . Penicillins Other (See Comments)    Syncope, hypotension    Social History   Socioeconomic History  . Marital status: Married    Spouse name: Not on file  . Number of children: Not on file  . Years of education: Not on file  . Highest education level: Not on file  Occupational History  . Not on file  Social Needs  . Physicist, medicalinancial resource  strain: Not on file  . Food insecurity:    Worry: Not on file    Inability: Not on file  . Transportation needs:    Medical: Not on file    Non-medical: Not on file  Tobacco Use  . Smoking status: Never Smoker  . Smokeless tobacco: Never Used  Substance and Sexual Activity  . Alcohol use: No  . Drug use: No  . Sexual activity: Yes    Birth control/protection: None  Lifestyle  . Physical activity:    Days per week: Not on file    Minutes per session: Not on file  . Stress: Not on file  Relationships  . Social connections:    Talks on phone: Not on file    Gets together: Not on file    Attends religious service: Not on file    Active member of club or organization: Not on file    Attends meetings of clubs or organizations: Not on file     Relationship status: Not on file  . Intimate partner violence:    Fear of current or ex partner: Not on file    Emotionally abused: Not on file    Physically abused: Not on file    Forced sexual activity: Not on file  Other Topics Concern  . Not on file  Social History Narrative  . Not on file    Family History  Problem Relation Age of Onset  . Hypertension Mother        Living  . Cancer - Other Mother        Oral  . Hypertension Father   . Alzheimer's disease Father        Living  . Hypertension Brother   . Stroke Brother [id number]       Deceased  . Alzheimer's disease Paternal Aunt   . Stroke Paternal Aunt        #1  . Dementia Paternal Aunt        #2  . Healthy Brother        x1  . Healthy Son        x3    ROS- All systems are reviewed and negative except as per the HPI above  Physical Exam: Vitals:   03/17/18 1338  BP: (!) 146/90  Pulse: 66  SpO2: 98%  Weight: 126.6 kg  Height: 5\' 6"  (1.676 m)   Wt Readings from Last 3 Encounters:  03/17/18 126.6 kg  02/13/18 122.5 kg  10/29/17 122.5 kg    Labs: Lab Results  Component Value Date   NA 140 10/15/2017   K 4.5 10/15/2017   CL 103 10/15/2017   CO2 22 10/15/2017   GLUCOSE 104 (H) 10/15/2017   BUN 16 10/15/2017   CREATININE 1.06 10/15/2017   CALCIUM 9.7 10/15/2017   MG 2.1 01/10/2017   Lab Results  Component Value Date   INR 1.10 03/25/2017   Lab Results  Component Value Date   CHOL 225 (H) 01/11/2017   HDL 41 01/11/2017   LDLCALC 127 (H) 01/11/2017   TRIG 284 (H) 01/11/2017     GEN- The patient is well appearing, alert and oriented x 3 today.   Head- normocephalic, atraumatic Eyes-  Sclera clear, conjunctiva pink Ears- hearing intact Oropharynx- clear Neck- supple, no JVP Lymph- no cervical lymphadenopathy Lungs- Clear to ausculation bilaterally, normal work of breathing Heart- irregular rate and rhythm, no murmurs, rubs or gallops, PMI not laterally displaced GI- soft, NT, ND, +  BS Extremities-  no clubbing, cyanosis, or edema MS- no significant deformity or atrophy Skin- no rash or lesion Psych- euthymic mood, full affect Neuro- strength and sensation are intact  EKG- afib at 65 bpm, qrs int 104 ms, qtc 418 ms Epic records reviewed Echo - 12/2016- Study Conclusions  - Left ventricle: The cavity size was normal. There was mild   concentric hypertrophy. Systolic function was normal. The   estimated ejection fraction was in the range of 50% to 55%. Wall   motion was normal; there were no regional wall motion   abnormalities. Left ventricular diastolic function parameters   were normal. - Aortic valve: There was trivial regurgitation. - Left atrium: The atrium was mildly dilated. - Right ventricle: The cavity size was mildly dilated. Wall   thickness was normal. - Right atrium: The atrium was mildly dilated. - Atrial septum: No defect or patent foramen ovale was identified.   Echo contrast study showed no right-to-left atrial level shunt,   at baseline or with provocation.   Assessment and Plan: 1. New onset afib 2018 He is rate controlled but has noted some increase in dyspnea, has gained some weight Will update echo,but has to renew orange card first, he will call back when in effect Continue diltiazem 120 mg daily Continue xarelto 20 mg daily  Chadsvasc score is at least 3    Donna C. Matthew Folks [facility] [address] [place], [place] [address] [phone]

## 2018-03-23 ENCOUNTER — Other Ambulatory Visit (HOSPITAL_COMMUNITY): Payer: Self-pay | Admitting: *Deleted

## 2018-03-23 MED ORDER — RIVAROXABAN 20 MG PO TABS: ORAL_TABLET | ORAL | 5 refills | Status: DC

## 2018-03-24 ENCOUNTER — Other Ambulatory Visit (HOSPITAL_COMMUNITY): Payer: Self-pay | Admitting: *Deleted

## 2018-03-24 MED ORDER — RIVAROXABAN 20 MG PO TABS: ORAL_TABLET | ORAL | 5 refills | Status: DC

## 2018-04-29 ENCOUNTER — Ambulatory Visit: Payer: Self-pay | Admitting: Family Medicine

## 2018-05-03 ENCOUNTER — Ambulatory Visit (HOSPITAL_COMMUNITY): Payer: Self-pay | Admitting: Nurse Practitioner

## 2018-05-19 ENCOUNTER — Ambulatory Visit (INDEPENDENT_AMBULATORY_CARE_PROVIDER_SITE_OTHER): Payer: Self-pay | Admitting: Family Medicine

## 2018-05-19 ENCOUNTER — Encounter: Payer: Self-pay | Admitting: Family Medicine

## 2018-05-19 VITALS — BP 114/60 | HR 84 | Temp 97.7°F | Ht 66.0 in | Wt 282.0 lb

## 2018-05-19 DIAGNOSIS — Z8673 Personal history of transient ischemic attack (TIA), and cerebral infarction without residual deficits: Secondary | ICD-10-CM

## 2018-05-19 DIAGNOSIS — Z131 Encounter for screening for diabetes mellitus: Secondary | ICD-10-CM

## 2018-05-19 DIAGNOSIS — Z8679 Personal history of other diseases of the circulatory system: Secondary | ICD-10-CM

## 2018-05-19 DIAGNOSIS — G4730 Sleep apnea, unspecified: Secondary | ICD-10-CM

## 2018-05-19 DIAGNOSIS — I10 Essential (primary) hypertension: Secondary | ICD-10-CM

## 2018-05-19 DIAGNOSIS — R0602 Shortness of breath: Secondary | ICD-10-CM

## 2018-05-19 LAB — POCT GLYCOSYLATED HEMOGLOBIN (HGB A1C): Hemoglobin A1C: 5.9 % — AB (ref 4.0–5.6)

## 2018-05-19 LAB — POCT URINALYSIS DIP (MANUAL ENTRY)
Bilirubin, UA: NEGATIVE
Blood, UA: NEGATIVE
Glucose, UA: NEGATIVE mg/dL
Leukocytes, UA: NEGATIVE
Nitrite, UA: NEGATIVE
Spec Grav, UA: >=1.03 — AB (ref 1.010–1.025)
Urobilinogen, UA: 1 E.U./dL
pH, UA: 5 (ref 5.0–8.0)

## 2018-05-19 MED ORDER — FUROSEMIDE 40 MG PO TABS: 40.0000 mg | ORAL_TABLET | Freq: Every day | ORAL | 3 refills | Status: DC

## 2018-05-19 MED ORDER — RIVAROXABAN 20 MG PO TABS: ORAL_TABLET | ORAL | 5 refills | Status: DC

## 2018-05-19 MED ORDER — DILTIAZEM HCL ER COATED BEADS 120 MG PO CP24: 120.0000 mg | ORAL_CAPSULE | Freq: Every day | ORAL | 3 refills | Status: DC

## 2018-05-19 MED ORDER — ATORVASTATIN CALCIUM 40 MG PO TABS: 40.0000 mg | ORAL_TABLET | Freq: Every day | ORAL | 3 refills | Status: DC

## 2018-05-19 NOTE — Patient Instructions (Signed)
Shortness of Breath, Adult Shortness of breath means you have trouble breathing. Shortness of breath could be a sign of a medical problem. Follow these instructions at home:   Watch for any changes in your symptoms.  Do not use any products that contain nicotine or tobacco, such as cigarettes, e-cigarettes, and chewing tobacco.  Do not smoke. Smoking can cause shortness of breath. If you need help to quit smoking, ask your doctor.  Avoid things that can make it harder to breathe, such as: ? Mold. ? Dust. ? Air pollution. ? Chemical smells. ? Things that can cause allergy symptoms (allergens), if you have allergies.  Keep your living space clean. Use products that help remove mold and dust.  Rest as needed. Slowly return to your normal activities.  Take over-the-counter and prescription medicines only as told by your doctor. This includes oxygen therapy and inhaled medicines.  Keep all follow-up visits as told by your doctor. This is important. Contact a doctor if:  Your condition does not get better as soon as expected.  You have a hard time doing your normal activities, even after you rest.  You have new symptoms. Get help right away if:  Your shortness of breath gets worse.  You have trouble breathing when you are resting.  You feel light-headed or you pass out (faint).  You have a cough that is not helped by medicines.  You cough up blood.  You have pain with breathing.  You have pain in your chest, arms, shoulders, or belly (abdomen).  You have a fever.  You cannot walk up stairs.  You cannot exercise the way you normally do. These symptoms may represent a serious problem that is an emergency. Do not wait to see if the symptoms will go away. Get medical help right away. Call your local emergency services (911 in the [place] Do not drive yourself to the hospital. Summary  Shortness of breath is when you have trouble breathing enough air. It can be a sign of a  medical problem.  Avoid things that make it hard for you to breathe, such as smoking, pollution, mold, and dust.  Watch for any changes in your symptoms. Contact your doctor if you do not get better or you get worse. This information is not intended to replace advice given to you by your health care provider. Make sure you discuss any questions you have with your health care provider. Document Released: 08/19/2007 Document Revised: 08/02/2017 Document Reviewed: 08/02/2017 Elsevier Interactive Patient Education  2019 [employer] Heart Failure  Heart failure means your heart has trouble pumping blood. This makes it hard for your body to work well. Heart failure is usually a long-term (chronic) condition. You must take good care of yourself and follow your treatment plan from your doctor. Follow these instructions at home: Medicines  Take over-the-counter and prescription medicines only as told by your doctor. ? Do not stop taking your medicine unless your doctor told you to do that. ? Do not skip any doses. ? Refill your prescriptions before you run out of medicine. You need your medicines every day. Eating and drinking   Eat heart-healthy foods. Talk with a diet and nutrition specialist (dietitian) to make an eating plan.  Choose foods that: ? Have no trans fat. ? Are low in saturated fat and cholesterol.  Choose healthy foods, like: ? Fresh or frozen fruits and vegetables. ? Fish. ? Low-fat (lean) meats. ? Legumes (like beans, peas, and lentils). ? Fat-free or  low-fat dairy products. ? Whole-grain foods. ? High-fiber foods.  Limit salt (sodium) if told by your doctor. Ask your nutrition specialist to recommend heart-healthy seasonings.  Cook in healthy ways instead of frying. Healthy ways of cooking include: ? Roasting. ? Grilling. ? Broiling. ? Baking. ? Poaching. ? Steaming. ? Stir-frying.  Limit how much fluid you drink, if told by your doctor. Lifestyle  Do  not smoke or use chewing tobacco. Do not use nicotine gum or patches before talking to your doctor.  Limit alcohol intake to no more than 1 drink a day for non-pregnant women and 2 drinks a day for men. One drink equals 12 oz of beer, 5 oz of wine, or 1 oz of hard liquor. ? Tell your doctor if you drink alcohol many times a week. ? Talk with your doctor about whether any alcohol is safe for you. ? You should stop drinking alcohol: ? If your heart has been damaged by alcohol. ? You have very bad heart failure.  Do not use illegal drugs.  Lose weight if told by your doctor.  Do moderate physical activity if told by your doctor. Ask your doctor what activities are safe for you if: ? You are of older age (elderly). ? You have very bad heart failure. Keep track of important information  Weigh yourself every day. ? Weigh yourself every morning after you pee (urinate) and before breakfast. ? Wear the same amount of clothing each time. ? Write down your daily weight. Give your record to your doctor.  Check and write down your blood pressure as told by your doctor.  Check your pulse as told by your doctor. Dealing with heat and cold  If the weather is very hot: ? Avoid activity that takes a lot of energy. ? Use air conditioning or fans, or find a cooler place. ? Avoid caffeine. ? Avoid alcohol. ? Wear clothing that is loose-fitting, lightweight, and light-colored.  If the weather is very cold: ? Avoid activity that takes a lot of energy. ? Layer your clothes. ? Wear mittens or gloves, a hat, and a scarf when you go outside. ? Avoid alcohol. General instructions  Manage other conditions that you have as told by your doctor.  Learn to manage stress. If you need help, ask your doctor.  Plan rest periods for when you get tired.  Get education and support as needed.  Get rehab (rehabilitation) to help you stay independent and to help with everyday tasks.  Stay up to date with  shots (immunizations), especially pneumococcal and flu (influenza) shots.  Keep all follow-up visits as told by your doctor. This is important. Contact a doctor if:  You gain weight quickly.  You are more short of breath than normal.  You cannot do your normal activities.  You tire easily.  You cough more than normal, especially with activity.  You have any or more puffiness (swelling) in areas such as your hands, feet, ankles, or belly (abdomen).  You cannot sleep because it is hard to breathe.  You feel like your heart is beating fast (palpitations).  You get dizzy or light-headed when you stand up. Get help right away if:  You have trouble breathing.  You or someone else notices a change in your awareness. This could be trouble staying awake or trouble concentrating.  You have chest pain or discomfort.  You pass out (faint). Summary  Heart failure means your heart has trouble pumping blood.  Make sure you refill  your prescriptions before you run out of medicine. You need your medicines every day.  Keep records of your weight and blood pressure to give to your doctor.  Contact a doctor if you gain weight quickly. This information is not intended to replace advice given to you by your health care provider. Make sure you discuss any questions you have with your health care provider. Document Released: 12/10/2007 Document Revised: 11/24/2017 Document Reviewed: 03/24/2016 Elsevier Interactive Patient Education  2019 [employer].

## 2018-05-19 NOTE — Progress Notes (Signed)
[facility]   Progress Note: General Provider: Mike Gip, FNP  SUBJECTIVE:   Edward Weaver is a 63 y.o. male who  has a past medical history of Arthritis, Atrial fibrillation (HCC), Bell's palsy, Edema, Hiatal hernia, Hypertension, Stroke (HCC), and TIA (transient ischemic attack).. Patient presents today for Shortness of Breath and Fatigue [facility] and last seen on 03/17/2018. NP Noralyn Pick would like to repeat his ECHO, but patient is currently uninsured and his orange card has lapsed. He continues to have SOB and fatigue. He is not a smoker, but is exposed to second hand smoke daily. Patient denies use of ETOH. He reports compliance with his medications. Denies side effects. He does not currently exercise or follow a low carb diet.   Review of Systems  Constitutional: Positive for malaise/fatigue.  HENT: Negative.   Eyes: Negative.   Respiratory: Positive for shortness of breath.   Cardiovascular: Negative.   Gastrointestinal: Negative.   Genitourinary: Negative.   Musculoskeletal: Negative.   Skin: Negative.   Neurological: Negative.   Psychiatric/Behavioral: Negative.      OBJECTIVE: BP 114/60 (BP Location: Left Arm, Patient Position: Sitting, Cuff Size: Large)   Pulse 84   Temp 97.7 F (36.5 C) (Oral)   Ht 5\' 6"  (1.676 m)   Wt 282 lb (127.9 kg)   SpO2 97%   BMI 45.52 kg/m   Wt Readings from Last 3 Encounters:  05/19/18 282 lb (127.9 kg)  03/17/18 279 lb (126.6 kg)  02/13/18 270 lb (122.5 kg)     Physical Exam Vitals signs and nursing note reviewed.  Constitutional:      General: He is not in acute distress.    Appearance: He is well-developed.  HENT:     Head: Normocephalic and atraumatic.  Cardiovascular:     Rate and Rhythm: Normal rate. Rhythm irregularly irregular.     Heart sounds: No murmur.  Pulmonary:     Effort: Pulmonary effort is normal. No respiratory distress.     Breath  sounds: Normal breath sounds. No decreased breath sounds, wheezing, rhonchi or rales.  Abdominal:     General: Bowel sounds are normal.     Palpations: Abdomen is soft.     Tenderness: There is no abdominal tenderness.  Musculoskeletal: Normal range of motion.  Skin:    General: Skin is warm and dry.  Neurological:     Mental Status: He is alert and oriented to person, place, and time.  Psychiatric:        Behavior: Behavior normal.        Thought Content: Thought content normal.        Judgment: Judgment normal.     ASSESSMENT/PLAN:   1. Screening for diabetes mellitus A1C is 5.9. Prediabetic range.  The patient is asked to make an attempt to improve diet and exercise patterns to aid in medical management of this problem.  - POCT glycosylated hemoglobin (Hb A1C) - POCT urinalysis dipstick  2. History of atrial fibrillation Patient is being followed by a specialist for this condition. Patient advised to continue with follow up appointments. PCP will continue to monitor progress.   - diltiazem (CARDIZEM CD) 120 MG 24 hr capsule; Take 1 capsule (120 mg total) by mouth daily.  Dispense: 30 capsule; Refill: 3 - furosemide (LASIX) 40 MG tablet; Take 1 tablet (40 mg total) by mouth daily.  Dispense: 30 tablet; Refill: 3 - rivaroxaban (XARELTO) 20 MG TABS  tablet; TAKE 1 TABLET (20 MG TOTAL) BY MOUTH DAILY.  Dispense: 30 tablet; Refill: 5  3. Observed sleep apnea Patient reports using CPAP every night.   4. History of TIA (transient ischemic attack) Pending labs. Will adjust medications accordingly.     5. Shortness of breath Pending labs. Will adjust medications accordingly. Chest x ray ordered. Lungs CTA.    Patient encouraged to get orange care reinstated ASAP.  - Brain natriuretic peptide - Comprehensive metabolic panel - D-dimer, quantitative (not at [facility]) - DG Chest 2 View; Future    Return in about 6 weeks (around 06/30/2018) for SOB and HTN.    The patient was given  clear instructions to go to ER or return to medical center if symptoms do not improve, worsen or new problems develop. The patient verbalized understanding and agreed with plan of care.   Ms. Freda Jackson. Riley Lam, FNP-BC [facility] [address] [place], [place] [address] [phone]

## 2018-05-20 ENCOUNTER — Ambulatory Visit (HOSPITAL_COMMUNITY)
Admission: RE | Admit: 2018-05-20 | Discharge: 2018-05-20 | Disposition: A | Discharge status: Home / Self Care | Payer: Self-pay | Source: Ambulatory Visit | Attending: Family Medicine | Admitting: Family Medicine

## 2018-05-20 DIAGNOSIS — R0602 Shortness of breath: Secondary | ICD-10-CM | POA: Insufficient documentation

## 2018-05-20 LAB — MICROALBUMIN, URINE: Microalbumin, Urine: 9.9 ug/mL

## 2018-05-20 LAB — COMPREHENSIVE METABOLIC PANEL
ALT: 25 IU/L (ref 0–44)
AST: 12 IU/L (ref 0–40)
Albumin/Globulin Ratio: 1.4 (ref 1.2–2.2)
Albumin: 4.1 g/dL (ref 3.8–4.8)
Alkaline Phosphatase: 70 IU/L (ref 39–117)
BUN/Creatinine Ratio: 13 (ref 10–24)
BUN: 14 mg/dL (ref 8–27)
Bilirubin Total: 0.5 mg/dL (ref 0.0–1.2)
CO2: 23 mmol/L (ref 20–29)
Calcium: 9.3 mg/dL (ref 8.6–10.2)
Chloride: 106 mmol/L (ref 96–106)
Creatinine, Ser: 1.12 mg/dL (ref 0.76–1.27)
GFR calc Af Amer: 81 mL/min/{1.73_m2} (ref >59)
GFR calc non Af Amer: 70 mL/min/{1.73_m2} (ref >59)
Globulin, Total: 2.9 g/dL (ref 1.5–4.5)
Glucose: 102 mg/dL — ABNORMAL HIGH (ref 65–99)
Potassium: 4.2 mmol/L (ref 3.5–5.2)
Sodium: 141 mmol/L (ref 134–144)
Total Protein: 7 g/dL (ref 6.0–8.5)

## 2018-05-20 LAB — D-DIMER, QUANTITATIVE: D-DIMER: <0.2 mg/L FEU (ref 0.00–0.49)

## 2018-05-20 LAB — BRAIN NATRIURETIC PEPTIDE: BNP: 96.8 pg/mL (ref 0.0–100.0)

## 2018-05-23 ENCOUNTER — Telehealth: Payer: Self-pay

## 2018-05-23 NOTE — Telephone Encounter (Signed)
Called patient and reported labs and xray normal per Mike Gip.Advised patient if he continues with fatigue to come back prior to 6 week appointment.

## 2018-05-30 ENCOUNTER — Ambulatory Visit: Payer: Self-pay

## 2018-06-09 MED FILL — CARTIA XT 120 MG CP24: 120 | 30 days supply | Qty: 30 | Fill #0

## 2018-06-09 MED FILL — ATORVASTATIN CALCIUM 40 MG: 40 | 30 days supply | Qty: 30 | Fill #0

## 2018-06-09 MED FILL — XARELTO 20 MG TABLET: 20 | 30 days supply | Qty: 30 | Fill #0

## 2018-06-30 ENCOUNTER — Ambulatory Visit: Payer: Self-pay | Admitting: Family Medicine

## 2018-07-01 ENCOUNTER — Ambulatory Visit (INDEPENDENT_AMBULATORY_CARE_PROVIDER_SITE_OTHER): Payer: Self-pay | Admitting: Family Medicine

## 2018-07-01 ENCOUNTER — Other Ambulatory Visit: Payer: Self-pay

## 2018-07-01 DIAGNOSIS — F419 Anxiety disorder, unspecified: Secondary | ICD-10-CM

## 2018-07-01 DIAGNOSIS — G629 Polyneuropathy, unspecified: Secondary | ICD-10-CM

## 2018-07-01 DIAGNOSIS — I10 Essential (primary) hypertension: Secondary | ICD-10-CM

## 2018-07-01 DIAGNOSIS — I4819 Other persistent atrial fibrillation: Secondary | ICD-10-CM

## 2018-07-01 NOTE — Progress Notes (Signed)
  [facility]  Virtual Visit via Telephone Note  I connected with Patrica Duel on 07/01/18 at 11:20 AM EDT by telephone and verified that I am speaking with the correct person using two identifiers.   I discussed the limitations, risks, security and privacy concerns of performing an evaluation and management service by telephone and the availability of in person appointments. I also discussed with the patient that there may be a patient responsible charge related to this service. The patient expressed understanding and agreed to proceed.   History of Present Illness: Patient states that he is continuing to have restless legs right before going to bed each night. Causing a sleep disturbance. He reports feeling tired in the morning and not having any "get up and go". He denies chest pain SOB, dizziness or leg swelling. He does admit feeling down and having difficulty with concentrating. He denies SI, HI, AH, VH. He has been on gabapentin in the past and states that he is reluctant to start taking it again. Unsure as to why.     Observations/Objective: Patient with regular voice tone, rate and rhythm. Speaking calmly and is in no apparent distress.    Assessment and Plan: 1. Hypertension, unspecified type  2. Neuropathy - CBC With Differential; Future - Comprehensive metabolic panel; Future - VITAMIN D 25 Hydroxy (Vit-D Deficiency, Fractures); Future - Vitamin B12; Future - gabapentin (NEURONTIN) 300 MG capsule; Take 1 capsule (300 mg total) by mouth 3 (three) times daily.  Dispense: 90 capsule; Refill: 3  3. Other persistent atrial fibrillation No medication changes warranted at the present time.    4. Anxiety Discussed restarting gabapentin to help with mood and neuropathy. Increasing it to  300mg  that he can take QHS. He can increase after 1 week to BID if desired.  - gabapentin (NEURONTIN) 300 MG capsule; Take 1 capsule (300 mg total)  by mouth 3 (three) times daily.  Dispense: 90 capsule; Refill: 3  Follow Up Instructions:    I discussed the assessment and treatment plan with the patient. The patient was provided an opportunity to ask questions and all were answered. The patient agreed with the plan and demonstrated an understanding of the instructions.   The patient was advised to call back or seek an in-person evaluation if the symptoms worsen or if the condition fails to improve as anticipated.  I provided 15 minutes of non-face-to-face time during this encounter.  Ms. Andr L. Riley Lam, FNP-BC [facility] [address] [place], [place] [address] [phone]

## 2018-07-04 ENCOUNTER — Encounter: Payer: Self-pay | Admitting: Family Medicine

## 2018-07-04 MED ORDER — GABAPENTIN 300 MG PO CAPS: 300.0000 mg | ORAL_CAPSULE | Freq: Three times a day (TID) | ORAL | 3 refills | Status: DC

## 2018-07-05 ENCOUNTER — Other Ambulatory Visit: Payer: Self-pay

## 2018-07-18 MED FILL — DILTIAZEM 24HR CD 120 MG CA: 120 | 30 days supply | Qty: 30 | Fill #1

## 2018-07-18 MED FILL — ?ATORVASTATIN 40MG TABLET: 40 | 90 days supply | Qty: 90 | Fill #1

## 2018-07-18 MED FILL — CARTIA XT 120 MG CP24: 120 | 30 days supply | Qty: 30 | Fill #1

## 2018-07-18 MED FILL — XARELTO 20 MG TABLET: 20 | 30 days supply | Qty: 30 | Fill #1

## 2018-07-18 MED FILL — GABAPENTIN 300 MG CAPSULE: 300 | 30 days supply | Qty: 90 | Fill #0

## 2018-09-01 MED FILL — DILTIAZEM 24HR CD 120 MG CA: 120 | 30 days supply | Qty: 30 | Fill #2

## 2018-09-01 MED FILL — XARELTO 20 MG TABLET: 20 | 30 days supply | Qty: 30 | Fill #2

## 2018-09-13 MED FILL — GABAPENTIN 300 MG CAPSULE: 300 | 30 days supply | Qty: 90 | Fill #1

## 2018-09-30 ENCOUNTER — Ambulatory Visit: Payer: Self-pay | Admitting: Family Medicine

## 2018-10-19 ENCOUNTER — Ambulatory Visit: Payer: Self-pay

## 2018-10-19 MED FILL — XARELTO 20 MG TABLET: 20 | 30 days supply | Qty: 30 | Fill #3

## 2018-10-21 ENCOUNTER — Ambulatory Visit: Payer: Self-pay | Admitting: Family Medicine

## 2018-10-31 MED FILL — GABAPENTIN 300 MG CAPSULE: 300 | 30 days supply | Qty: 90 | Fill #2

## 2018-11-23 ENCOUNTER — Encounter (HOSPITAL_COMMUNITY): Payer: Self-pay | Admitting: *Deleted

## 2018-11-23 ENCOUNTER — Encounter (HOSPITAL_COMMUNITY): Payer: Self-pay

## 2018-11-23 MED FILL — CARTIA XT 120 MG CP24: 120 | 30 days supply | Qty: 30 | Fill #3

## 2018-11-23 MED FILL — XARELTO 20 MG TABLET: 20 | 30 days supply | Qty: 30 | Fill #4

## 2018-12-15 ENCOUNTER — Other Ambulatory Visit: Payer: Self-pay | Admitting: Family Medicine

## 2018-12-15 MED FILL — ATORVASTATIN CALCIUM 40 MG: 40 | 30 days supply | Qty: 30 | Fill #0

## 2018-12-15 MED FILL — CARTIA XT 120 MG CP24: 120 | 30 days supply | Qty: 30 | Fill #0

## 2018-12-15 MED FILL — GABAPENTIN 300 MG CAPSULE: 300 | 30 days supply | Qty: 90 | Fill #3

## 2018-12-23 MED FILL — XARELTO 20 MG TABLET: 20 | 30 days supply | Qty: 30 | Fill #5

## 2019-01-20 ENCOUNTER — Telehealth: Payer: Self-pay | Admitting: Internal Medicine

## 2019-01-20 ENCOUNTER — Other Ambulatory Visit: Payer: Self-pay | Admitting: Family Medicine

## 2019-01-20 DIAGNOSIS — F419 Anxiety disorder, unspecified: Secondary | ICD-10-CM

## 2019-01-20 DIAGNOSIS — Z8679 Personal history of other diseases of the circulatory system: Secondary | ICD-10-CM

## 2019-01-20 DIAGNOSIS — G629 Polyneuropathy, unspecified: Secondary | ICD-10-CM

## 2019-01-20 NOTE — Telephone Encounter (Signed)
Called, left a message needs an appointment before refills can be sent in. Thanks!

## 2019-01-23 ENCOUNTER — Telehealth: Payer: Self-pay | Admitting: Internal Medicine

## 2019-01-23 DIAGNOSIS — F419 Anxiety disorder, unspecified: Secondary | ICD-10-CM

## 2019-01-23 DIAGNOSIS — G629 Polyneuropathy, unspecified: Secondary | ICD-10-CM

## 2019-01-23 DIAGNOSIS — Z8679 Personal history of other diseases of the circulatory system: Secondary | ICD-10-CM

## 2019-01-23 MED ORDER — RIVAROXABAN 20 MG PO TABS: ORAL_TABLET | ORAL | 5 refills | Status: DC

## 2019-01-23 MED ORDER — GABAPENTIN 300 MG PO CAPS: 300.0000 mg | ORAL_CAPSULE | Freq: Three times a day (TID) | ORAL | 3 refills | Status: DC

## 2019-01-23 MED FILL — GABAPENTIN 300 MG CAPSULE: 300 | 30 days supply | Qty: 90 | Fill #0

## 2019-01-23 MED FILL — XARELTO 20 MG TABLET: 20 | 30 days supply | Qty: 30 | Fill #0

## 2019-01-23 NOTE — Telephone Encounter (Signed)
Refills sent to pharmacy. Thanks.

## 2019-03-06 MED FILL — XARELTO 20 MG TABLET: 20 | 30 days supply | Qty: 30 | Fill #1

## 2019-03-06 MED FILL — GABAPENTIN 300 MG CAPSULE: 300 | 30 days supply | Qty: 90 | Fill #1

## 2019-03-06 MED FILL — ATORVASTATIN CALCIUM 40 MG: 40 | 30 days supply | Qty: 30 | Fill #1

## 2019-03-07 MED FILL — CARTIA XT 120 MG CP24: 120 | 30 days supply | Qty: 30 | Fill #1

## 2019-03-16 MED FILL — CARTIA XT 120 MG CP24: 120 | 30 days supply | Qty: 30 | Fill #1

## 2019-04-04 ENCOUNTER — Ambulatory Visit: Payer: Self-pay | Admitting: Family Medicine

## 2019-04-07 ENCOUNTER — Other Ambulatory Visit: Payer: Self-pay | Admitting: Family Medicine

## 2019-04-07 DIAGNOSIS — Z8679 Personal history of other diseases of the circulatory system: Secondary | ICD-10-CM

## 2019-04-07 MED FILL — GABAPENTIN 300 MG CAPSULE: 300 | 30 days supply | Qty: 90 | Fill #2

## 2019-04-07 MED FILL — XARELTO 20 MG TABLET: 20 | 30 days supply | Qty: 30 | Fill #2

## 2019-04-18 ENCOUNTER — Telehealth: Payer: Self-pay | Admitting: Internal Medicine

## 2019-04-18 DIAGNOSIS — Z8679 Personal history of other diseases of the circulatory system: Secondary | ICD-10-CM

## 2019-04-18 MED ORDER — DILTIAZEM HCL ER COATED BEADS 120 MG PO CP24: 120.0000 mg | ORAL_CAPSULE | Freq: Every day | ORAL | 3 refills | Status: DC

## 2019-04-18 MED FILL — DILTIAZEM 24HR ER 120 MG CA: 120 | 30 days supply | Qty: 30 | Fill #0

## 2019-04-18 NOTE — Telephone Encounter (Signed)
Refill sent to pharmacy.   

## 2019-04-20 ENCOUNTER — Ambulatory Visit: Payer: Self-pay | Admitting: Nurse Practitioner

## 2019-04-24 ENCOUNTER — Encounter: Payer: Self-pay | Admitting: Nurse Practitioner

## 2019-04-24 ENCOUNTER — Ambulatory Visit (INDEPENDENT_AMBULATORY_CARE_PROVIDER_SITE_OTHER): Payer: Self-pay | Admitting: Nurse Practitioner

## 2019-04-24 ENCOUNTER — Other Ambulatory Visit: Payer: Self-pay

## 2019-04-24 VITALS — BP 125/74 | HR 88 | Temp 97.8°F | Resp 16 | Ht 66.0 in | Wt 274.0 lb

## 2019-04-24 DIAGNOSIS — R609 Edema, unspecified: Secondary | ICD-10-CM

## 2019-04-24 DIAGNOSIS — I10 Essential (primary) hypertension: Secondary | ICD-10-CM

## 2019-04-24 DIAGNOSIS — Z6841 Body Mass Index (BMI) 40.0 and over, adult: Secondary | ICD-10-CM

## 2019-04-24 DIAGNOSIS — I4891 Unspecified atrial fibrillation: Secondary | ICD-10-CM

## 2019-04-24 NOTE — Progress Notes (Signed)
Established Patient Office Visit  Subjective:  Patient ID: Edward Weaver, male    DOB: 1955-09-26  Age: 64 y.o. MRN: [id number]  CC:  Chief Complaint  Patient presents with  . Follow-up    a-fib   . Leg Pain    left leg pain and weakness that comes and goes     HPI Edward Weaver presents for.  He has a history of atrial fibrillation, TIA, neuropathy, right lower extremity venous insufficiency, osteoarthritis and anxiety.  He was a previous patient of Lanae Boast, FNP.  He has been followed by cardiology in the past.  He is currently on diltiazem 120 mg daily, Xarelto 20 mg and Lasix 40 mg.   His complaint today is that he is having some weakness in his left leg this has been going on for a few day.Marland Kitchen  He denies any numbness or tingling.  He admits he has some occasional swelling.  The pain is in his leg is worse with activity.  He admits that he is not doing increasing activity or exercise.  He denies any skin changes or change in medication.  He is not taking any over-the-counter medications for pain.  He admits that he does get occasional spasms.  He denies any history of any muscle pains. Denies headache, dizziness, visual changes, shortness of breath, dyspnea on exertion, chest pain, nausea or vomiting . He admits that he has not taken his Lasix in greater than 1 week.  Past Medical History:  Diagnosis Date  . Arthritis   . Atrial fibrillation ([place])   . Bell's palsy   . Edema   . Hiatal hernia   . Hypertension   . Stroke ([place])   . TIA (transient ischemic attack)    Nov and/or Dec 2014    Past Surgical History:  Procedure Laterality Date  . ESOPHAGUS SURGERY    . KNEE SURGERY Right    1974  . TONSILLECTOMY    . WISDOM TOOTH EXTRACTION      Family History  Problem Relation Age of Onset  . Hypertension Mother        Living  . Cancer - Other Mother        Oral  . Hypertension Father   . Alzheimer's disease Father        Living  . Hypertension Brother   . Stroke  Brother 23       Deceased  . Alzheimer's disease Paternal Aunt   . Stroke Paternal Aunt        #1  . Dementia Paternal Aunt        #2  . Healthy Brother        x1  . Healthy Son        x3    Social History   Socioeconomic History  . Marital status: Married    Spouse name: Not on file  . Number of children: Not on file  . Years of education: Not on file  . Highest education level: Not on file  Occupational History  . Not on file  Tobacco Use  . Smoking status: Never Smoker  . Smokeless tobacco: Never Used  Substance and Sexual Activity  . Alcohol use: No  . Drug use: No  . Sexual activity: Yes    Birth control/protection: None  Other Topics Concern  . Not on file  Social History Narrative  . Not on file   Social Determinants of Health   Financial Resource Strain:   .  Difficulty of Paying Living Expenses: Not on file  Food Insecurity:   . Worried About Programme researcher, broadcasting/film/video in the Last Year: Not on file  . Ran Out of Food in the Last Year: Not on file  Transportation Needs:   . Lack of Transportation (Medical): Not on file  . Lack of Transportation (Non-Medical): Not on file  Physical Activity:   . Days of Exercise per Week: Not on file  . Minutes of Exercise per Session: Not on file  Stress:   . Feeling of Stress : Not on file  Social Connections:   . Frequency of Communication with Friends and Family: Not on file  . Frequency of Social Gatherings with Friends and Family: Not on file  . Attends Religious Services: Not on file  . Active Member of Clubs or Organizations: Not on file  . Attends Banker Meetings: Not on file  . Marital Status: Not on file  Intimate Partner Violence:   . Fear of Current or Ex-Partner: Not on file  . Emotionally Abused: Not on file  . Physically Abused: Not on file  . Sexually Abused: Not on file    Outpatient Medications Prior to Visit  Medication Sig Dispense Refill  . atorvastatin (LIPITOR) 40 MG tablet TAKE 1  TABLET (40 MG TOTAL) BY MOUTH DAILY AT 6 PM. 30 tablet 3  . diltiazem (CARDIZEM CD) 120 MG 24 hr capsule Take 1 capsule (120 mg total) by mouth daily. 30 capsule 3  . furosemide (LASIX) 40 MG tablet Take 1 tablet (40 mg total) by mouth daily. 30 tablet 3  . gabapentin (NEURONTIN) 300 MG capsule Take 1 capsule (300 mg total) by mouth 3 (three) times daily. 90 capsule 3  . rivaroxaban (XARELTO) 20 MG TABS tablet TAKE 1 TABLET (20 MG TOTAL) BY MOUTH DAILY. 30 tablet 5  . magnesium gluconate (MAGONATE) 500 MG tablet Take 1 tablet (500 mg total) by mouth 2 (two) times daily. (Patient not taking: Reported on 04/24/2019) 60 tablet 2  . valACYclovir (VALTREX) 1000 MG tablet Take 1 tablet (1,000 mg total) by mouth daily. (Patient not taking: Reported on 05/19/2018) 5 tablet 0   No facility-administered medications prior to visit.    Allergies  Allergen Reactions  . Penicillins Other (See Comments)    Syncope, hypotension    ROS Review of Systems  Constitutional: Negative.   HENT: Negative.   Eyes: Negative.   Respiratory: Positive for cough.   Cardiovascular: Positive for leg swelling.  Gastrointestinal: Negative.   Endocrine: Negative.   Genitourinary: Negative.   Musculoskeletal: Positive for myalgias.  Skin: Negative.   Allergic/Immunologic: Negative.   Neurological: Negative.   Hematological: Negative.   Psychiatric/Behavioral: Negative.       Objective:    Physical Exam  Constitutional: He is oriented to person, place, and time. He appears well-developed.  HENT:  Head: Normocephalic.  Cardiovascular: Regular rhythm.  Irregular irregularity  Pulmonary/Chest: Effort normal and breath sounds normal.  Abdominal: Soft. Bowel sounds are normal.  Musculoskeletal:        General: Edema present.     Cervical back: Normal range of motion and neck supple.     Comments: 2+  Neurological: He is alert and oriented to person, place, and time.  Skin: Skin is warm and dry.  Psychiatric: He  has a normal mood and affect. His behavior is normal. Judgment and thought content normal.    BP 125/74 (BP Location: Left Arm, Patient Position: Sitting, Cuff Size:  Large)   Pulse 88   Temp 97.8 F (36.6 C) (Oral)   Resp 16   Ht 5\' 6"  (1.676 m)   Wt 274 lb (124.3 kg)   SpO2 99%   BMI 44.22 kg/m  Wt Readings from Last 3 Encounters:  04/24/19 274 lb (124.3 kg)  05/19/18 282 lb (127.9 kg)  03/17/18 279 lb (126.6 kg)     There are no preventive care reminders to display for this patient.  There are no preventive care reminders to display for this patient.  Lab Results  Component Value Date   TSH 1.385 01/10/2017   Lab Results  Component Value Date   WBC 6.0 10/15/2017   HGB 14.8 10/15/2017   HCT 45.1 10/15/2017   MCV 87 10/15/2017   PLT 302 10/15/2017   Lab Results  Component Value Date   NA 141 05/19/2018   K 4.2 05/19/2018   CO2 23 05/19/2018   GLUCOSE 102 (H) 05/19/2018   BUN 14 05/19/2018   CREATININE 1.12 05/19/2018   BILITOT 0.5 05/19/2018   ALKPHOS 70 05/19/2018   AST 12 05/19/2018   ALT 25 05/19/2018   PROT 7.0 05/19/2018   ALBUMIN 4.1 05/19/2018   CALCIUM 9.3 05/19/2018   ANIONGAP 8 03/25/2017   Lab Results  Component Value Date   CHOL 225 (H) 01/11/2017   Lab Results  Component Value Date   HDL 41 01/11/2017   Lab Results  Component Value Date   LDLCALC 127 (H) 01/11/2017   Lab Results  Component Value Date   TRIG 284 (H) 01/11/2017   Lab Results  Component Value Date   CHOLHDL 5.5 01/11/2017   Lab Results  Component Value Date   HGBA1C 5.9 (A) 05/19/2018      Assessment & Plan:   Problem List Items Addressed This Visit      Unprioritized   A-fib (HCC) - Primary   Relevant Orders   CMP14+LP+CBC/D/Plt+TSH+PSA   Obesity   Relevant Orders   CMP14+LP+CBC/D/Plt+TSH+PSA    Other Visit Diagnoses    Hypertension, unspecified type       Continue with current regimen   Relevant Orders   CMP14+LP+CBC/D/Plt+TSH+PSA   Edema,  unspecified type       Instructed patient to restart his Lasix as directed   Relevant Orders   CMP14+LP+CBC/D/Plt+TSH+PSA      No orders of the defined types were placed in this encounter.   Follow-up: Return in about 3 months (around 07/22/2019).    09/21/2019, NP

## 2019-04-24 NOTE — Patient Instructions (Signed)
Edema  Edema is when you have too much fluid in your body or under your skin. Edema may make your legs, feet, and ankles swell up. Swelling is also common in looser tissues, like around your eyes. This is a common condition. It gets more common as you get older. There are many possible causes of edema. Eating too much salt (sodium) and being on your feet or sitting for a long time can cause edema in your legs, feet, and ankles. Hot weather may make edema worse. Edema is usually painless. Your skin may look swollen or shiny. Follow these instructions at home:  Keep the swollen body part raised (elevated) above the level of your heart when you are sitting or lying down.  Do not sit still or stand for a long time.  Do not wear tight clothes. Do not wear garters on your upper legs.  Exercise your legs. This can help the swelling go down.  Wear elastic bandages or support stockings as told by your doctor.  Eat a low-salt (low-sodium) diet to reduce fluid as told by your doctor.  Depending on the cause of your swelling, you may need to limit how much fluid you drink (fluid restriction).  Take over-the-counter and prescription medicines only as told by your doctor. Contact a doctor if:  Treatment is not working.  You have heart, liver, or kidney disease and have symptoms of edema.  You have sudden and unexplained weight gain. Get help right away if:  You have shortness of breath or chest pain.  You cannot breathe when you lie down.  You have pain, redness, or warmth in the swollen areas.  You have heart, liver, or kidney disease and get edema all of a sudden.  You have a fever and your symptoms get worse all of a sudden. Summary  Edema is when you have too much fluid in your body or under your skin.  Edema may make your legs, feet, and ankles swell up. Swelling is also common in looser tissues, like around your eyes.  Raise (elevate) the swollen body part above the level of your  heart when you are sitting or lying down.  Follow your doctor's instructions about diet and how much fluid you can drink (fluid restriction). This information is not intended to replace advice given to you by your health care provider. Make sure you discuss any questions you have with your health care provider. Document Revised: 03/05/2017 Document Reviewed: 03/20/2016 Elsevier Patient Education  2020 Elsevier Inc.  

## 2019-04-25 LAB — CMP14+LP+CBC/D/PLT+TSH+PSA
ALT: 26 IU/L (ref 0–44)
AST: 13 IU/L (ref 0–40)
Albumin/Globulin Ratio: 1.4 (ref 1.2–2.2)
Albumin: 4.2 g/dL (ref 3.8–4.8)
Alkaline Phosphatase: 82 IU/L (ref 39–117)
BUN/Creatinine Ratio: 16 (ref 10–24)
BUN: 16 mg/dL (ref 8–27)
Basophils Absolute: 0.1 10*3/uL (ref 0.0–0.2)
Basos: 1 %
Bilirubin Total: 0.5 mg/dL (ref 0.0–1.2)
CO2: 21 mmol/L (ref 20–29)
Calcium: 9.4 mg/dL (ref 8.6–10.2)
Chloride: 103 mmol/L (ref 96–106)
Cholesterol, Total: 226 mg/dL — ABNORMAL HIGH (ref 100–199)
Creatinine, Ser: 1.02 mg/dL (ref 0.76–1.27)
EOS (ABSOLUTE): 0.4 10*3/uL (ref 0.0–0.4)
Eos: 7 %
GFR calc Af Amer: 90 mL/min/{1.73_m2} (ref >59)
GFR calc non Af Amer: 78 mL/min/{1.73_m2} (ref >59)
Globulin, Total: 2.9 g/dL (ref 1.5–4.5)
Glucose: 109 mg/dL — ABNORMAL HIGH (ref 65–99)
HDL: 46 mg/dL (ref >39)
Hematocrit: 44.4 % (ref 37.5–51.0)
Hemoglobin: 14.9 g/dL (ref 13.0–17.7)
Immature Grans (Abs): 0 10*3/uL (ref 0.0–0.1)
Immature Granulocytes: 0 %
LDL Chol Calc (NIH): 134 mg/dL — ABNORMAL HIGH (ref 0–99)
Lymphocytes Absolute: 2 10*3/uL (ref 0.7–3.1)
Lymphs: 32 %
MCH: 28.7 pg (ref 26.6–33.0)
MCHC: 33.6 g/dL (ref 31.5–35.7)
MCV: 86 fL (ref 79–97)
Monocytes Absolute: 0.5 10*3/uL (ref 0.1–0.9)
Monocytes: 8 %
Neutrophils Absolute: 3.2 10*3/uL (ref 1.4–7.0)
Neutrophils: 52 %
Platelets: 317 10*3/uL (ref 150–450)
Potassium: 4.6 mmol/L (ref 3.5–5.2)
Prostate Specific Ag, Serum: 2.8 ng/mL (ref 0.0–4.0)
RBC: 5.19 x10E6/uL (ref 4.14–5.80)
RDW: 13.2 % (ref 11.6–15.4)
Sodium: 138 mmol/L (ref 134–144)
TSH: 3.04 u[IU]/mL (ref 0.450–4.500)
Total Protein: 7.1 g/dL (ref 6.0–8.5)
Triglycerides: 255 mg/dL — ABNORMAL HIGH (ref 0–149)
VLDL Cholesterol Cal: 46 mg/dL — ABNORMAL HIGH (ref 5–40)
WBC: 6.2 10*3/uL (ref 3.4–10.8)

## 2019-04-26 ENCOUNTER — Telehealth: Payer: Self-pay

## 2019-04-26 NOTE — Telephone Encounter (Signed)
-----   Message from Barbette Merino, NP sent at 04/26/2019  9:15 AM EST ----- Please make sure that Mr. Edward Weaver is taking his Lipitor as directed.   If he is having side effects he needs to make [place] aware and we can adjust the Lipitor. All other labs look fine.

## 2019-04-26 NOTE — Telephone Encounter (Signed)
Called, no answer. Left a message to call back.  

## 2019-04-27 NOTE — Telephone Encounter (Signed)
Called, no answer. Left a message to call back. Thanks!  

## 2019-04-28 NOTE — Telephone Encounter (Signed)
Called, no answer. Left a message to call back. Thanks!  

## 2019-05-01 ENCOUNTER — Telehealth: Payer: Self-pay

## 2019-05-01 ENCOUNTER — Encounter: Payer: Self-pay | Admitting: Nurse Practitioner

## 2019-05-01 ENCOUNTER — Other Ambulatory Visit: Payer: Self-pay | Admitting: Nurse Practitioner

## 2019-05-01 DIAGNOSIS — E785 Hyperlipidemia, unspecified: Secondary | ICD-10-CM | POA: Insufficient documentation

## 2019-05-01 MED ORDER — ATORVASTATIN CALCIUM 40 MG PO TABS: 40.0000 mg | ORAL_TABLET | Freq: Every day | ORAL | 0 refills | Status: DC

## 2019-05-01 MED FILL — ?ATORVASTATIN 40MG TABL: 40 | 30 days supply | Qty: 30 | Fill #0

## 2019-05-01 NOTE — Telephone Encounter (Signed)
No, I'm sorry. He does need this sent in. Thanks!

## 2019-05-01 NOTE — Telephone Encounter (Signed)
Called, no answer. Did not leave a message, have left 3 already. I will mail letter asking him to contact our office. Thanks!

## 2019-05-01 NOTE — Telephone Encounter (Signed)
Patient returned call regarding labs/ lipid results. He states he had been out of Lipitor for a while and was not taking it daily. I advised him that he should take this everyday consistently;y and work on cutting back on fried/fatty foods or any foods that may be high in cholesterol. Pt denies any side effects from medication.

## 2019-05-05 MED FILL — XARELTO 20 MG TABLET: 20 | 30 days supply | Qty: 30 | Fill #3

## 2019-05-24 MED FILL — GABAPENTIN 300 MG CAPSULE: 300 | 30 days supply | Qty: 90 | Fill #3

## 2019-05-25 MED FILL — CARTIA XT 120 MG CP24: 120 | 30 days supply | Qty: 30 | Fill #1

## 2019-06-06 MED FILL — XARELTO 20 MG TABLET: 20 | 30 days supply | Qty: 30 | Fill #4

## 2019-07-04 MED FILL — CARTIA XT 120 MG CP24: 120 | 30 days supply | Qty: 30 | Fill #2

## 2019-07-10 ENCOUNTER — Other Ambulatory Visit: Payer: Self-pay | Admitting: Family Medicine

## 2019-07-10 DIAGNOSIS — G629 Polyneuropathy, unspecified: Secondary | ICD-10-CM

## 2019-07-10 DIAGNOSIS — F419 Anxiety disorder, unspecified: Secondary | ICD-10-CM

## 2019-07-10 MED FILL — GABAPENTIN 300 MG CAPSULE: 300 | 30 days supply | Qty: 90 | Fill #0

## 2019-07-10 MED FILL — XARELTO 20 MG TABLET: 20 | 30 days supply | Qty: 30 | Fill #5

## 2019-07-21 ENCOUNTER — Ambulatory Visit: Payer: Self-pay | Admitting: Nurse Practitioner

## 2019-08-02 ENCOUNTER — Other Ambulatory Visit: Payer: Self-pay | Admitting: Family Medicine

## 2019-08-02 DIAGNOSIS — Z8679 Personal history of other diseases of the circulatory system: Secondary | ICD-10-CM

## 2019-08-02 MED FILL — XARELTO 20 MG TABLET: 20 | 30 days supply | Qty: 30 | Fill #0

## 2019-08-02 MED FILL — CARTIA XT 120 MG CP24: 120 | 30 days supply | Qty: 30 | Fill #3

## 2019-08-22 ENCOUNTER — Other Ambulatory Visit: Payer: Self-pay | Admitting: Family Medicine

## 2019-08-22 DIAGNOSIS — Z8679 Personal history of other diseases of the circulatory system: Secondary | ICD-10-CM

## 2019-09-12 ENCOUNTER — Other Ambulatory Visit: Payer: Self-pay | Admitting: Family Medicine

## 2019-09-12 DIAGNOSIS — Z8679 Personal history of other diseases of the circulatory system: Secondary | ICD-10-CM

## 2019-09-12 MED FILL — GABAPENTIN 300 MG CAPSULE: 300 | 30 days supply | Qty: 90 | Fill #1

## 2019-09-15 ENCOUNTER — Encounter: Payer: Self-pay | Admitting: Family Medicine

## 2019-09-15 ENCOUNTER — Other Ambulatory Visit: Payer: Self-pay

## 2019-09-15 ENCOUNTER — Ambulatory Visit (INDEPENDENT_AMBULATORY_CARE_PROVIDER_SITE_OTHER): Payer: Self-pay | Admitting: Family Medicine

## 2019-09-15 VITALS — BP 120/91 | HR 67 | Temp 98.1°F | Ht 66.0 in | Wt 276.0 lb

## 2019-09-15 DIAGNOSIS — E66813 Obesity, class 3: Secondary | ICD-10-CM

## 2019-09-15 DIAGNOSIS — I10 Essential (primary) hypertension: Secondary | ICD-10-CM

## 2019-09-15 DIAGNOSIS — F419 Anxiety disorder, unspecified: Secondary | ICD-10-CM

## 2019-09-15 DIAGNOSIS — R609 Edema, unspecified: Secondary | ICD-10-CM

## 2019-09-15 DIAGNOSIS — Z09 Encounter for follow-up examination after completed treatment for conditions other than malignant neoplasm: Secondary | ICD-10-CM

## 2019-09-15 DIAGNOSIS — Z8679 Personal history of other diseases of the circulatory system: Secondary | ICD-10-CM

## 2019-09-15 DIAGNOSIS — Z6841 Body Mass Index (BMI) 40.0 and over, adult: Secondary | ICD-10-CM

## 2019-09-15 MED ORDER — DILTIAZEM HCL ER COATED BEADS 120 MG PO CP24: 120.0000 mg | ORAL_CAPSULE | Freq: Every day | ORAL | 3 refills | Status: DC

## 2019-09-15 MED ORDER — RIVAROXABAN 20 MG PO TABS: ORAL_TABLET | ORAL | 3 refills | Status: DC

## 2019-09-15 MED FILL — CARTIA XT 120 MG CP24: 120 | 30 days supply | Qty: 30 | Fill #0

## 2019-09-15 MED FILL — XARELTO 20 MG TABLET: 20 | 30 days supply | Qty: 30 | Fill #0

## 2019-09-15 NOTE — Progress Notes (Signed)
[facility]   Established Patient Office Visit  Subjective:  Patient ID: Edward Weaver, male    DOB: 04/03/1955  Age: 64 y.o. MRN: [id number]  CC:  Chief Complaint  Patient presents with  . Follow-up    renewal of meds     HPI Edward Weaver is a 64 year old male who presents for Follow Up today.    Patient Active Problem List   Diagnosis Date Noted  . Dyslipidemia 05/01/2019  . Neuropathy 03/08/2017  . Dizziness 03/08/2017  . History of atrial fibrillation 03/08/2017  . A-fib (HCC) 01/10/2017  . Obesity 01/10/2017  . Anxiety 08/02/2013  . TIA (transient ischemic attack) 08/02/2013  . Visit for preventive health examination 07/24/2013  . OA (osteoarthritis) of knee 07/24/2013  . Venous insufficiency of right lower extremity 07/24/2013  . Pain in joint of right ankle or foot 05/14/2011  . Osteochondral defect of talus 05/14/2011    Past Medical History:  Diagnosis Date  . Arthritis   . Atrial fibrillation (HCC)   . Bell's palsy   . Edema   . Hiatal hernia   . Hypertension   . Stroke (HCC)   . TIA (transient ischemic attack)    Nov and/or Dec 2014   Current Status: This will be Edward Weaver's  initial office visit with me. He was previously seeing Thad Rangerrystal King, NP for his PCP needs. Since his last office visit, she has c/o occasional chest pain on exertion, which is relieved with rest. He has history of atrial fib. His blood pressures are elevated today. He normally takes his hypertensive medications at night. He also states that his blood pressure readings are higher when he has doctor's visits, but stable at home. He denies fevers, chills, fatigue, recent infections, weight loss, and night sweats. He has not had any headaches, visual changes, dizziness, and falls. No chest pain, heart palpitations, cough and shortness of breath reported. No reports of GI problems such as nausea, vomiting, diarrhea, and constipation. He  has no reports of blood in stools, dysuria and hematuria. No depression or anxiety, and denies suicidal ideations, homicidal ideations, or auditory hallucinations. He is taking all medications as prescribed. He denies pain today.   Past Surgical History:  Procedure Laterality Date  . ESOPHAGUS SURGERY    . KNEE SURGERY Right    1974  . TONSILLECTOMY    . WISDOM TOOTH EXTRACTION      Family History  Problem Relation Age of Onset  . Hypertension Mother        Living  . Cancer - Other Mother        Oral  . Hypertension Father   . Alzheimer's disease Father        Living  . Hypertension Brother   . Stroke Brother 3562       Deceased  . Alzheimer's disease Paternal Aunt   . Stroke Paternal Aunt        #1  . Dementia Paternal Aunt        #2  . Healthy Brother        x1  . Healthy Son        x3    Social History   Socioeconomic History  . Marital status: Married    Spouse name: Not on file  . Number of children: Not on file  . Years of education: Not on file  . Highest education level: Not on file  Occupational  History  . Not on file  Tobacco Use  . Smoking status: Never Smoker  . Smokeless tobacco: Never Used  Vaping Use  . Vaping Use: Never used  Substance and Sexual Activity  . Alcohol use: No  . Drug use: No  . Sexual activity: Yes    Birth control/protection: None  Other Topics Concern  . Not on file  Social History Narrative  . Not on file   Social Determinants of Health   Financial Resource Strain:   . Difficulty of Paying Living Expenses:   Food Insecurity:   . Worried About Programme researcher, broadcasting/film/video in the Last Year:   . Barista in the Last Year:   Transportation Needs:   . Freight forwarder (Medical):   Marland Kitchen Lack of Transportation (Non-Medical):   Physical Activity:   . Days of Exercise per Week:   . Minutes of Exercise per Session:   Stress:   . Feeling of Stress :   Social Connections:   . Frequency of Communication with Friends and  Family:   . Frequency of Social Gatherings with Friends and Family:   . Attends Religious Services:   . Active Member of Clubs or Organizations:   . Attends Banker Meetings:   Marland Kitchen Marital Status:   Intimate Partner Violence:   . Fear of Current or Ex-Partner:   . Emotionally Abused:   Marland Kitchen Physically Abused:   . Sexually Abused:     Outpatient Medications Prior to Visit  Medication Sig Dispense Refill  . atorvastatin (LIPITOR) 40 MG tablet Take 1 tablet (40 mg total) by mouth daily at 6 PM. 90 tablet 0  . furosemide (LASIX) 40 MG tablet Take 1 tablet (40 mg total) by mouth daily. 30 tablet 3  . gabapentin (NEURONTIN) 300 MG capsule TAKE 1 CAPSULE (300 MG TOTAL) BY MOUTH 3 (THREE) TIMES DAILY. 90 capsule 3  . diltiazem (CARDIZEM CD) 120 MG 24 hr capsule Take 1 capsule (120 mg total) by mouth daily. 30 capsule 3  . XARELTO 20 MG TABS tablet TAKE 1 TABLET (20 MG TOTAL) BY MOUTH DAILY. 30 tablet 0  . magnesium gluconate (MAGONATE) 500 MG tablet Take 1 tablet (500 mg total) by mouth 2 (two) times daily. (Patient not taking: Reported on 09/15/2019) 60 tablet 2  . valACYclovir (VALTREX) 1000 MG tablet Take 1 tablet (1,000 mg total) by mouth daily. (Patient not taking: Reported on 05/19/2018) 5 tablet 0   No facility-administered medications prior to visit.    Allergies  Allergen Reactions  . Penicillins Other (See Comments)    Syncope, hypotension    ROS Review of Systems  Constitutional: Negative.   HENT: Negative.   Eyes: Negative.   Respiratory: Positive for shortness of breath (exertion).   Cardiovascular: Negative.   Gastrointestinal: Positive for abdominal distention (obese).  Endocrine: Negative.   Genitourinary: Negative.   Musculoskeletal: Negative.   Skin: Negative.   Allergic/Immunologic: Negative.   Neurological: Positive for dizziness (occasional) and headaches (occasional).  Hematological: Negative.   Psychiatric/Behavioral: Negative.       Objective:     Physical Exam Vitals and nursing note reviewed.  Constitutional:      Appearance: Normal appearance. He is obese.  HENT:     Head: Normocephalic and atraumatic.     Nose: Nose normal.     Mouth/Throat:     Mouth: Mucous membranes are moist.     Pharynx: Oropharynx is clear.  Cardiovascular:  Rate and Rhythm: Normal rate and regular rhythm.     Pulses: Normal pulses.     Heart sounds: Normal heart sounds.  Pulmonary:     Effort: Pulmonary effort is normal.     Breath sounds: Normal breath sounds.  Abdominal:     General: Bowel sounds are normal. There is distension (obese).     Palpations: Abdomen is soft.  Musculoskeletal:        General: Normal range of motion.     Cervical back: Normal range of motion and neck supple.  Skin:    General: Skin is warm.     Capillary Refill: Capillary refill takes less than 2 seconds.  Neurological:     General: No focal deficit present.     Mental Status: He is alert and oriented to person, place, and time.  Psychiatric:        Mood and Affect: Mood normal.        Behavior: Behavior normal.        Thought Content: Thought content normal.        Judgment: Judgment normal.     BP (!) 120/91   Pulse 67   Temp 98.1 F (36.7 C) (Temporal)   Ht 5\' 6"  (1.676 m)   Wt 276 lb (125.2 kg)   SpO2 99%   BMI 44.55 kg/m  Wt Readings from Last 3 Encounters:  09/15/19 276 lb (125.2 kg)  04/24/19 274 lb (124.3 kg)  05/19/18 282 lb (127.9 kg)     Health Maintenance Due  Topic Date Due  . Hepatitis C Screening  Never done  . COVID-19 Vaccine (1) Never done  . TETANUS/TDAP  Never done  . COLONOSCOPY  Never done    There are no preventive care reminders to display for this patient.  Lab Results  Component Value Date   TSH 3.040 04/24/2019   Lab Results  Component Value Date   WBC 6.2 04/24/2019   HGB 14.9 04/24/2019   HCT 44.4 04/24/2019   MCV 86 04/24/2019   PLT 317 04/24/2019   Lab Results  Component Value Date   NA 138  04/24/2019   K 4.6 04/24/2019   CO2 21 04/24/2019   GLUCOSE 109 (H) 04/24/2019   BUN 16 04/24/2019   CREATININE 1.02 04/24/2019   BILITOT 0.5 04/24/2019   ALKPHOS 82 04/24/2019   AST 13 04/24/2019   ALT 26 04/24/2019   PROT 7.1 04/24/2019   ALBUMIN 4.2 04/24/2019   CALCIUM 9.4 04/24/2019   ANIONGAP 8 03/25/2017   Lab Results  Component Value Date   CHOL 226 (H) 04/24/2019   Lab Results  Component Value Date   HDL 46 04/24/2019   Lab Results  Component Value Date   LDLCALC 134 (H) 04/24/2019   Lab Results  Component Value Date   TRIG 255 (H) 04/24/2019   Lab Results  Component Value Date   CHOLHDL 5.5 01/11/2017   Lab Results  Component Value Date   HGBA1C 5.9 (A) 05/19/2018      Assessment & Plan:   1. History of atrial fibrillation - rivaroxaban (XARELTO) 20 MG TABS tablet; TAKE 1 TABLET (20 MG TOTAL) BY MOUTH DAILY.  Dispense: 30 tablet; Refill: 3 - diltiazem (CARDIZEM CD) 120 MG 24 hr capsule; Take 1 capsule (120 mg total) by mouth daily.  Dispense: 30 capsule; Refill: 3  2. Hypertension, unspecified type The current medical regimen is effective; blood pressure is stable at 120/91 today; continue present plan and medications as  prescribed. He will continue to take medications as prescribed, to decrease high sodium intake, excessive alcohol intake, increase potassium intake, smoking cessation, and increase physical activity of at least 30 minutes of cardio activity daily. He will continue to follow Heart Healthy or DASH diet.  3. Edema, unspecified type Stable. 1+ bilateral lower extremities. Not worsening.   4. Class 3 severe obesity due to excess calories with serious comorbidity and body mass index (BMI) of 40.0 to 44.9 in adult [facility]) Body mass index is 44.55 kg/m. Goal BMI  is <30. Encouraged efforts to reduce weight include engaging in physical activity as tolerated with goal of 150 minutes per week. Improve dietary choices and eat a meal regimen  consistent with a Mediterranean or DASH diet. Reduce simple carbohydrates. Do not skip meals and eat healthy snacks throughout the day to avoid over-eating at dinner. Set a goal weight loss that is achievable for you.  5. Anxiety Stable today. Monitor.   6. Follow up He will follow up in 3 months.   Meds ordered this encounter  Medications  . rivaroxaban (XARELTO) 20 MG TABS tablet    Sig: TAKE 1 TABLET (20 MG TOTAL) BY MOUTH DAILY.    Dispense:  30 tablet    Refill:  3  . diltiazem (CARDIZEM CD) 120 MG 24 hr capsule    Sig: Take 1 capsule (120 mg total) by mouth daily.    Dispense:  30 capsule    Refill:  3    No orders of the defined types were placed in this encounter.  Referral Orders  No referral(s) requested today    Raliegh Ip,  MSN, FNP-BC [facility] [address] [place], [place] [address] [phone]- fax    Problem List Items Addressed This Visit      Other   Anxiety   History of atrial fibrillation   Relevant Medications   rivaroxaban (XARELTO) 20 MG TABS tablet   diltiazem (CARDIZEM CD) 120 MG 24 hr capsule   Obesity    Other Visit Diagnoses    Hypertension, unspecified type    -  Primary   Relevant Medications   rivaroxaban (XARELTO) 20 MG TABS tablet   diltiazem (CARDIZEM CD) 120 MG 24 hr capsule   Edema, unspecified type          Meds ordered this encounter  Medications  . rivaroxaban (XARELTO) 20 MG TABS tablet    Sig: TAKE 1 TABLET (20 MG TOTAL) BY MOUTH DAILY.    Dispense:  30 tablet    Refill:  3  . diltiazem (CARDIZEM CD) 120 MG 24 hr capsule    Sig: Take 1 capsule (120 mg total) by mouth daily.    Dispense:  30 capsule    Refill:  3    Follow-up: No follow-ups on file.    Kallie Locks, FNP

## 2019-10-12 ENCOUNTER — Other Ambulatory Visit: Payer: Self-pay | Admitting: Family Medicine

## 2019-10-12 DIAGNOSIS — Z8679 Personal history of other diseases of the circulatory system: Secondary | ICD-10-CM

## 2019-10-12 MED FILL — GABAPENTIN 300 MG CAPSULE: 300 | 30 days supply | Qty: 90 | Fill #2

## 2019-10-12 MED FILL — FUROSEMIDE 40 MG TAB: 40 | 30 days supply | Qty: 30 | Fill #0

## 2019-10-12 MED FILL — CARTIA XT 120 MG CP24: 120 | 30 days supply | Qty: 30 | Fill #1

## 2019-10-12 MED FILL — XARELTO 20 MG TABLET: 20 | 30 days supply | Qty: 30 | Fill #1

## 2019-10-30 MED FILL — GABAPENTIN 300 MG CAPSULE: 300 | 30 days supply | Qty: 90 | Fill #2

## 2019-11-17 MED FILL — XARELTO 20 MG TABLET: 20 | 30 days supply | Qty: 30 | Fill #2

## 2019-11-17 MED FILL — CARTIA XT 120 MG CP24: 120 | 30 days supply | Qty: 30 | Fill #2

## 2019-12-14 MED FILL — XARELTO 20 MG TABLET: 20 | 30 days supply | Qty: 30 | Fill #3

## 2019-12-14 MED FILL — CARTIA XT 120 MG CP24: 120 | 30 days supply | Qty: 30 | Fill #3

## 2019-12-14 MED FILL — GABAPENTIN 300 MG CAPSULE: 300 | 30 days supply | Qty: 90 | Fill #3

## 2019-12-18 ENCOUNTER — Ambulatory Visit: Payer: Self-pay | Admitting: Nurse Practitioner

## 2020-01-12 ENCOUNTER — Telehealth: Payer: Self-pay | Admitting: General Practice

## 2020-01-12 NOTE — Telephone Encounter (Signed)
Patient was scheduled for 01/22/2020, but provider will be out of office. Two call attempts were made and letter has been sent.

## 2020-01-16 ENCOUNTER — Other Ambulatory Visit: Payer: Self-pay | Admitting: Nurse Practitioner

## 2020-01-16 ENCOUNTER — Other Ambulatory Visit: Payer: Self-pay | Admitting: Family Medicine

## 2020-01-16 DIAGNOSIS — G629 Polyneuropathy, unspecified: Secondary | ICD-10-CM

## 2020-01-16 DIAGNOSIS — Z8679 Personal history of other diseases of the circulatory system: Secondary | ICD-10-CM

## 2020-01-16 DIAGNOSIS — F419 Anxiety disorder, unspecified: Secondary | ICD-10-CM

## 2020-01-16 NOTE — Telephone Encounter (Signed)
RX request.

## 2020-01-17 ENCOUNTER — Other Ambulatory Visit: Payer: Self-pay | Admitting: Nurse Practitioner

## 2020-01-17 ENCOUNTER — Telehealth: Payer: Self-pay | Admitting: Nurse Practitioner

## 2020-01-17 MED FILL — CARTIA XT 120 MG CP24: 120 | 90 days supply | Qty: 90 | Fill #0

## 2020-01-17 MED FILL — XARELTO 20 MG TABLET: 20 | 30 days supply | Qty: 30 | Fill #0

## 2020-01-17 MED FILL — GABAPENTIN 300 MG CAPSULE: 300 | 30 days supply | Qty: 90 | Fill #0

## 2020-01-17 NOTE — Telephone Encounter (Signed)
Refill sent based on previous refill request

## 2020-01-22 ENCOUNTER — Ambulatory Visit: Payer: Self-pay | Admitting: Family Medicine

## 2020-01-24 ENCOUNTER — Ambulatory Visit: Payer: Self-pay | Admitting: Nurse Practitioner

## 2020-02-16 MED FILL — GABAPENTIN 300 MG CAPSULE: 300 | 30 days supply | Qty: 90 | Fill #1

## 2020-02-16 MED FILL — XARELTO 20 MG TABLET: 20 | 30 days supply | Qty: 30 | Fill #1

## 2020-03-22 MED FILL — XARELTO 20 MG TABLET: 20 | 30 days supply | Qty: 30 | Fill #2

## 2020-04-18 MED FILL — CARTIA XT 120 MG CP24: 120 | 90 days supply | Qty: 90 | Fill #1

## 2020-04-23 MED FILL — XARELTO 20 MG TABLET: 20 | 30 days supply | Qty: 30 | Fill #3

## 2020-05-08 ENCOUNTER — Ambulatory Visit: Payer: Self-pay | Admitting: Nurse Practitioner

## 2020-05-24 MED FILL — XARELTO 20 MG TABLET: 20 | 30 days supply | Qty: 30 | Fill #4

## 2020-06-13 DIAGNOSIS — H43821 Vitreomacular adhesion, right eye: Secondary | ICD-10-CM | POA: Diagnosis not present

## 2020-06-13 DIAGNOSIS — H43393 Other vitreous opacities, bilateral: Secondary | ICD-10-CM | POA: Diagnosis not present

## 2020-06-13 DIAGNOSIS — H59811 Chorioretinal scars after surgery for detachment, right eye: Secondary | ICD-10-CM | POA: Diagnosis not present

## 2020-06-13 DIAGNOSIS — H35373 Puckering of macula, bilateral: Secondary | ICD-10-CM | POA: Diagnosis not present

## 2020-06-18 ENCOUNTER — Other Ambulatory Visit: Payer: Self-pay

## 2020-06-18 MED FILL — Gabapentin Cap 300 MG: ORAL | 30 days supply | Qty: 90 | Fill #0 | Status: AC

## 2020-06-18 MED FILL — Rivaroxaban Tab 20 MG: ORAL | 30 days supply | Qty: 30 | Fill #0 | Status: AC

## 2020-06-20 ENCOUNTER — Other Ambulatory Visit: Payer: Self-pay

## 2020-07-11 ENCOUNTER — Other Ambulatory Visit: Payer: Self-pay

## 2020-07-11 MED FILL — Diltiazem HCl Coated Beads Cap ER 24HR 120 MG: ORAL | 90 days supply | Qty: 90 | Fill #0 | Status: CN

## 2020-07-18 ENCOUNTER — Other Ambulatory Visit: Payer: Self-pay

## 2020-07-19 ENCOUNTER — Telehealth: Payer: Self-pay

## 2020-07-19 ENCOUNTER — Ambulatory Visit: Payer: Self-pay | Admitting: Nurse Practitioner

## 2020-07-19 NOTE — Telephone Encounter (Signed)
Called number on file and couldn't/t leave a message for his NO SHOW appt.

## 2020-07-23 ENCOUNTER — Other Ambulatory Visit: Payer: Self-pay

## 2020-07-23 DIAGNOSIS — J45909 Unspecified asthma, uncomplicated: Secondary | ICD-10-CM | POA: Diagnosis not present

## 2020-07-23 DIAGNOSIS — I639 Cerebral infarction, unspecified: Secondary | ICD-10-CM | POA: Diagnosis not present

## 2020-07-23 DIAGNOSIS — R600 Localized edema: Secondary | ICD-10-CM | POA: Diagnosis not present

## 2020-07-23 DIAGNOSIS — I482 Chronic atrial fibrillation, unspecified: Secondary | ICD-10-CM | POA: Diagnosis not present

## 2020-07-23 MED FILL — Diltiazem HCl Coated Beads Cap ER 24HR 120 MG: ORAL | 30 days supply | Qty: 30 | Fill #0 | Status: CN

## 2020-07-23 MED FILL — Rivaroxaban Tab 20 MG: ORAL | 30 days supply | Qty: 30 | Fill #1 | Status: CN

## 2020-07-30 ENCOUNTER — Other Ambulatory Visit: Payer: Self-pay

## 2020-08-01 ENCOUNTER — Other Ambulatory Visit: Payer: Self-pay

## 2020-08-01 MED FILL — Rivaroxaban Tab 20 MG: ORAL | 30 days supply | Qty: 30 | Fill #1 | Status: AC

## 2020-08-01 MED FILL — Diltiazem HCl Coated Beads Cap ER 24HR 120 MG: ORAL | 90 days supply | Qty: 90 | Fill #0 | Status: AC

## 2020-08-01 MED FILL — Gabapentin Cap 300 MG: ORAL | 30 days supply | Qty: 90 | Fill #1 | Status: AC

## 2020-08-02 ENCOUNTER — Other Ambulatory Visit: Payer: Self-pay

## 2020-08-06 DIAGNOSIS — E119 Type 2 diabetes mellitus without complications: Secondary | ICD-10-CM | POA: Diagnosis not present

## 2020-08-06 DIAGNOSIS — R946 Abnormal results of thyroid function studies: Secondary | ICD-10-CM | POA: Diagnosis not present

## 2020-08-06 DIAGNOSIS — R7989 Other specified abnormal findings of blood chemistry: Secondary | ICD-10-CM | POA: Diagnosis not present

## 2020-08-06 DIAGNOSIS — I482 Chronic atrial fibrillation, unspecified: Secondary | ICD-10-CM | POA: Diagnosis not present

## 2020-08-06 DIAGNOSIS — Z125 Encounter for screening for malignant neoplasm of prostate: Secondary | ICD-10-CM | POA: Diagnosis not present

## 2020-08-06 DIAGNOSIS — G629 Polyneuropathy, unspecified: Secondary | ICD-10-CM | POA: Diagnosis not present

## 2020-08-09 NOTE — Progress Notes (Signed)
Date:  08/13/2020   ID:  ISAMI MEHRA, DOB 23-Aug-1955, MRN [id number]  PCP:  Tamsen Roers, MD  Cardiologist:  Rex Kras, DO, [facility] (established care 08/13/2020)  REASON FOR CONSULT: Atrial fibrillation  REQUESTING PHYSICIAN:  Tamsen Roers, MD [address] [place]  [id number]  Chief Complaint  Patient presents with  . Atrial Fibrillation  . New Patient (Initial Visit)    HPI  Edward Weaver is a 65 y.o. male who presents to the office with a chief complaint of " atrial fibrillation management." Patient's past medical history and cardiovascular risk factors include: OSA not CPAP, Atrial Fibrillation, TIA, Hypertension, Hyperlipidemia, bilateral carotid artery stenosis, obesity due to excess calories.  He is referred to the office at the request of Vevelyn Francois, NP for evaluation of atrial fibrillation.  Patient is accompanied by his wife Edward Weaver at today's office visit.  Atrial fibrillation: Patient presents to the office for management of atrial fibrillation.  He was last seen by the atrial fibrillation clinic last office note reviewed.  Diagnosed back in 2018 and he has been on AV nodal blocking agents and oral anticoagulation for thromboembolic prophylaxis.  Patient thinks that he had cardioversion x1 but does not recall if it restored normal sinus rhythm.  No prior history of atrial fibrillation ablation or antiarrhythmic medications.  He has been on Xarelto since 2018 and is tolerating the medication well.  He does not endorse any evidence of bleeding.  No history of gastrointestinal bleeding or intracranial bleeding.  Chest pain: Patient states that he has been experiencing left-sided chest pain, usually at rest while watching TV or when he is idle, occurs at least once every 2 to 3 weeks, 5-minute duration, no improving or worsening factors, self-limited, describes it as a dull/tight like sensation, not brought on by effort related activities and does not resolve with  rest.  Shortness of breath: More pronounced with effort related activities, present for the last 6 to 8 months.  Patient states that he also has lower extremity swelling which improves in the morning but does not resolve.  Denies orthopnea or paroxysmal nocturnal dyspnea.  He denies any prior history of congestive heart failure.  He has obstructive sleep apnea but chooses not to be on CPAP.  Review of electronic medical records also note that in 2018 he had a carotid duplex which noted bilateral carotid artery stenosis with no additional follow-up.  He recently had blood work done at his new PCP office records requested.  FUNCTIONAL STATUS: No structured exercise program or daily routine.   ALLERGIES: Allergies  Allergen Reactions  . Penicillins Other (See Comments)    Syncope, hypotension    MEDICATION LIST PRIOR TO VISIT: Current Meds  Medication Sig  . atorvastatin (LIPITOR) 40 MG tablet Take 1 tablet (40 mg total) by mouth daily at 6 PM.  . diltiazem (CARDIZEM CD) 120 MG 24 hr capsule TAKE 1 CAPSULE (120 MG TOTAL) BY MOUTH DAILY.  . furosemide (LASIX) 40 MG tablet TAKE 1 TABLET (40 MG TOTAL) BY MOUTH DAILY.  Marland Kitchen gabapentin (NEURONTIN) 300 MG capsule TAKE 1 CAPSULE (300 MG TOTAL) BY MOUTH 3 (THREE) TIMES DAILY.  . rivaroxaban (XARELTO) 20 MG TABS tablet TAKE 1 TABLET (20 MG TOTAL) BY MOUTH DAILY.  . valACYclovir (VALTREX) 1000 MG tablet Take 1 tablet (1,000 mg total) by mouth daily.     PAST MEDICAL HISTORY: Past Medical History:  Diagnosis Date  . Arthritis   . Atrial fibrillation ([place])   .  Bell's palsy   . Carotid artery stenosis, asymptomatic, bilateral   . Edema   . Hiatal hernia   . Hyperlipidemia   . Hypertension   . Stroke ([place])   . TIA (transient ischemic attack)    Nov and/or Dec 2014    PAST SURGICAL HISTORY: Past Surgical History:  Procedure Laterality Date  . ESOPHAGUS SURGERY    . KNEE SURGERY Right    1974  . TONSILLECTOMY    . WISDOM TOOTH  EXTRACTION      FAMILY HISTORY: The patient family history includes Alzheimer's disease in his father and paternal aunt; Cancer - Other in his mother; Dementia in his paternal aunt; Healthy in his brother and son; Heart disease in his mother; Hypertension in his father and mother; Stroke in his paternal aunt; Stroke (age of onset: 92) in his brother.  SOCIAL HISTORY:  The patient  reports that he has never smoked. He has never used smokeless tobacco. He reports that he does not drink alcohol and does not use drugs.  REVIEW OF SYSTEMS: Review of Systems  Constitutional: Negative for chills and fever.  HENT: Negative for hoarse voice and nosebleeds.   Eyes: Negative for discharge, double vision and pain.  Cardiovascular: Positive for chest pain and dyspnea on exertion. Negative for claudication, leg swelling, near-syncope, orthopnea, palpitations, paroxysmal nocturnal dyspnea and syncope.  Respiratory: Positive for shortness of breath and snoring. Negative for hemoptysis.   Musculoskeletal: Negative for muscle cramps and myalgias.  Gastrointestinal: Negative for abdominal pain, constipation, diarrhea, hematemesis, hematochezia, melena, nausea and vomiting.  Neurological: Negative for dizziness and light-headedness.    PHYSICAL EXAM: Vitals with BMI 08/13/2020 09/15/2019 09/15/2019  Height 5' 6"  - 5' 6"   Weight 285 lbs - 276 lbs  BMI 48.18 - 56.31  Systolic 497 026 378  Diastolic 82 91 96  Pulse 78 - 67    CONSTITUTIONAL: Appears older than stated age, hemodynamically stable, no acute distress.    SKIN: Skin is warm and dry. No rash noted. No cyanosis. No pallor. No jaundice HEAD: Normocephalic and atraumatic.  EYES: No scleral icterus MOUTH/THROAT: Moist oral membranes.  NECK: Unable to evaluate JVP due to a short neck stature, no JVD present. No thyromegaly noted. No carotid bruits  LYMPHATIC: No visible cervical adenopathy.  CHEST Normal respiratory effort. No intercostal retractions   LUNGS: Clear to auscultation bilaterally with decreased breath sounds at the bases.  No stridor. No wheezes. No rales.  CARDIOVASCULAR: Irregularly irregular, variable S1-S2, no murmurs rubs or gallops appreciated. ABDOMINAL: Obese, soft, nontender, nondistended, positive bowel sounds in all 4 quadrants, no abdominal bruits appreciated.  No apparent ascites.  EXTREMITIES: +1 pitting edema bilaterally, findings of chronic venous insufficiency, +2 dorsalis pedis and posterior tibial pulses, poor nail hygiene.   HEMATOLOGIC: No significant bruising NEUROLOGIC: Oriented to person, place, and time. Nonfocal. Normal muscle tone.  PSYCHIATRIC: Normal mood and affect. Normal behavior. Cooperative  CARDIAC DATABASE: EKG: 08/13/2020: Probable atrial fibrillation, 71 bpm, without underlying ischemia or injury pattern.  Echocardiogram: 01/11/2017:  Left ventricle: The cavity size was normal.  There was mild concentric hypertrophy.  Systolic function was normal.  The estimated ejection fraction was in the range of 50% to 55%.  Wall motion was normal; there were no regional wall motion abnormalities.  Left ventricular diastolic function parameters were normal.  Aortic valve: There was trivial regurgitation.  Left atrium: The atrium was mildly dilated.  Right ventricle: The cavity size was mildly dilated. Wall thickness was normal.  Right atrium: The atrium was mildly dilated.  Atrial septum: No defect or patent foramen ovale was identified.  Echo contrast study showed no right-to-left atrial level shunt, at baseline or with provocation.   Stress Testing: No results found for this or any previous visit from the past 1095 days.  Heart Catheterization: None   Carotid Duplex: 01/11/2017: Right Carotid: There is evidence in the right ICA of a 1-39% stenosis. Tortuous vessels.  Left Carotid: There is evidence in the left ICA of a 40-59% stenosis.  Vertebrals: Both vertebral arteries were patent  with antegrade flow.  LABORATORY DATA: CBC Latest Ref Rng & Units 04/24/2019 10/15/2017 03/25/2017  WBC 3.4 - 10.8 x10E3/uL 6.2 6.0 6.2  Hemoglobin 13.0 - 17.7 g/dL 14.9 14.8 14.4  Hematocrit 37.5 - 51.0 % 44.4 45.1 42.0  Platelets 150 - 450 x10E3/uL 317 302 305    CMP Latest Ref Rng & Units 04/24/2019 05/19/2018 10/15/2017  Glucose 65 - 99 mg/dL 109(H) 102(H) 104(H)  BUN 8 - 27 mg/dL 16 14 16   Creatinine 0.76 - 1.27 mg/dL 1.02 1.12 1.06  Sodium 134 - 144 mmol/L 138 141 140  Potassium 3.5 - 5.2 mmol/L 4.6 4.2 4.5  Chloride 96 - 106 mmol/L 103 106 103  CO2 20 - 29 mmol/L 21 23 22   Calcium 8.6 - 10.2 mg/dL 9.4 9.3 9.7  Total Protein 6.0 - 8.5 g/dL 7.1 7.0 6.8  Total Bilirubin 0.0 - 1.2 mg/dL 0.5 0.5 0.5  Alkaline Phos 39 - 117 IU/L 82 70 76  AST 0 - 40 IU/L 13 12 9   ALT 0 - 44 IU/L 26 25 24     Lipid Panel  Lab Results  Component Value Date   CHOL 226 (H) 04/24/2019   HDL 46 04/24/2019   LDLCALC 134 (H) 04/24/2019   TRIG 255 (H) 04/24/2019   CHOLHDL 5.5 01/11/2017    No components found for: NTPROBNP No results for input(s): PROBNP in the last 8760 hours. No results for input(s): TSH in the last 8760 hours.  BMP No results for input(s): NA, K, CL, CO2, GLUCOSE, BUN, CREATININE, CALCIUM, GFRNONAA, GFRAA in the last 8760 hours.  HEMOGLOBIN A1C Lab Results  Component Value Date   HGBA1C 5.9 (A) 05/19/2018   MPG 125.5 01/10/2017   External Labs: Collected: 08/06/2020 provided by PCP Creatinine 1 mg/dL. eGFR: 79 mL/min per 1.73 m Lipid profile: Total cholesterol 218, triglycerides 269, HDL 45, LDL 132, non HDL 173 Hemoglobin A1c: 6 TSH: 2.06  IMPRESSION:    ICD-10-CM   1. Persistent atrial fibrillation (HCC)  I48.19 EKG 12-Lead    PCV ECHOCARDIOGRAM COMPLETE    PCV MYOCARDIAL PERFUSION WITH LEXISCAN  2. Long term (current) use of anticoagulants  Z79.01   3. Precordial pain  R07.2   4. Dyspnea on exertion  R06.00   5. TIA (transient ischemic attack)  G45.9   6.  Bilateral carotid artery stenosis  I65.23 PCV CAROTID DUPLEX (BILATERAL)  7. Benign hypertension  I10   8. Dyslipidemia  E78.5   9. OSA (obstructive sleep apnea)  G47.33   10. Class 3 severe obesity due to excess calories with serious comorbidity and body mass index (BMI) of 40.0 to 44.9 in adult [facility])  E66.01    Z68.41      RECOMMENDATIONS: KHRYSTIAN SCHAUF is a 65 y.o. male whose past medical history and cardiac risk factors include: Prediabetes, OSA not CPAP, Atrial Fibrillation, TIA, Hypertension, Hyperlipidemia, bilateral carotid artery stenosis, obesity due to excess calories.  Atrial fibrillation  Persistent, diagnosed in 2018.  Rate control: Diltiazem.  Rhythm control: N/A.  Thromboembolic prophylaxis: Xarelto  States that he may have been cardioverted once in the past but does not recall if it restored normal sinus rhythm.  No prior history of antiarrhythmic medications or atrial fibrillation ablation.  He has been evaluated in A. fib clinic back in 2020 but lost to follow-up due to lack of insurance.  Not here to reestablish care as he is on Medicare.  Long-term oral anticoagulation:  Indication: Persistent atrial fibrillation.  Has remained on Xarelto since 2018 and does not endorse any evidence of bleeding.  No history of intracranial bleeding or gastrointestinal bleeding.  Risks, benefits, and alternatives to oral anticoagulation discussed.  Patient verbalizes understanding.  Precordial pain:  Has features of both cardiac and noncardiac discomfort and given his multiple cardiovascular risk factors additional work-up is advised.  Patient will be scheduled for a stress test to evaluate for reversible ischemia and echocardiogram to evaluate for structural heart disease.  Recommended risk factor modifications including but not limited to: Glycemic control, lipid and carbohydrate management, weight loss, hypertension.  Dyspnea on exertion:  Echo and stress test as  noted above.  Would like to check blood work prior to the next office visit including a BNP and BMP  Currently on Lasix.  Further recommendations to follow.  Benign essential hypertension:  Office blood pressures within acceptable range but not at goal.   Patient is asked to keep a log of his blood pressures and to bring it in at the next office visit.  Patient states that his diet is high in canned foods and frozen foods.  He is asked to avoid high salt content in his diet.  Will add antihypertensive medications based on his BP log at the next office visit.   Hyperlipidemia:  . Continue statin therapy.   . Follow lipids. Marland Kitchen LDL currently not at goal. . Recommend considering up titration of statin therapy, will defer to primary team. . Patient denies myalgia or other side effects.  Bilateral carotid artery stenosis:  Patient had a carotid duplex back in 2018 which notes bilateral carotid artery stenosis.  However he does not have any additional follow-up testing as he was lost to follow-up.  Continue statin therapy.  Will hold off on ASA as he is on [place] due to Afib.   Check carotid duplex prior to the next office visit.  FINAL MEDICATION LIST END OF ENCOUNTER: No orders of the defined types were placed in this encounter.   Medications Discontinued During This Encounter  Medication Reason  . magnesium gluconate (MAGONATE) 500 MG tablet Error     Current Outpatient Medications:  .  atorvastatin (LIPITOR) 40 MG tablet, Take 1 tablet (40 mg total) by mouth daily at 6 PM., Disp: 90 tablet, Rfl: 0 .  diltiazem (CARDIZEM CD) 120 MG 24 hr capsule, TAKE 1 CAPSULE (120 MG TOTAL) BY MOUTH DAILY., Disp: 90 capsule, Rfl: 3 .  furosemide (LASIX) 40 MG tablet, TAKE 1 TABLET (40 MG TOTAL) BY MOUTH DAILY., Disp: 30 tablet, Rfl: 3 .  gabapentin (NEURONTIN) 300 MG capsule, TAKE 1 CAPSULE (300 MG TOTAL) BY MOUTH 3 (THREE) TIMES DAILY., Disp: 90 capsule, Rfl: 3 .  rivaroxaban (XARELTO) 20  MG TABS tablet, TAKE 1 TABLET (20 MG TOTAL) BY MOUTH DAILY., Disp: 90 tablet, Rfl: 3 .  valACYclovir (VALTREX) 1000 MG tablet, Take 1 tablet (1,000 mg total) by mouth daily., Disp: 5 tablet, Rfl: 0  Orders Placed This Encounter  Procedures  . PCV MYOCARDIAL PERFUSION WITH LEXISCAN  . EKG 12-Lead  . PCV ECHOCARDIOGRAM COMPLETE  . PCV CAROTID DUPLEX (BILATERAL)    There are no Patient Instructions on file for this visit.   --Continue cardiac medications as reconciled in final medication list. --Return in about 4 weeks (around 09/10/2020) for A. fib, Review test results. Or sooner if needed. --Continue follow-up with your primary care physician regarding the management of your other chronic comorbid conditions.  Patient's questions and concerns were addressed to his satisfaction. He voices understanding of the instructions provided during this encounter.   This note was created using a voice recognition software as a result there may be grammatical errors inadvertently enclosed that do not reflect the nature of this encounter. Every attempt is made to correct such errors.  Total Time Spent: 82 minutes.  *Total Encounter Time as defined by the Centers for Medicare and Medicaid Services includes, in addition to the face-to-face time of a patient visit (documented in the note above) non-face-to-face time: obtaining and reviewing outside history, ordering and reviewing medications, tests or procedures, care coordination (communications with other health care professionals or caregivers) and documentation in the medical record.   Rex Kras, [place], [facility]  Pager: [phone] Office: [phone]

## 2020-08-13 ENCOUNTER — Other Ambulatory Visit: Payer: Self-pay

## 2020-08-13 ENCOUNTER — Encounter: Payer: Self-pay | Admitting: Cardiology

## 2020-08-13 ENCOUNTER — Ambulatory Visit: Payer: Medicare Other | Admitting: Cardiology

## 2020-08-13 VITALS — BP 140/82 | HR 78 | Temp 98.2°F | Resp 16 | Ht 66.0 in | Wt 285.0 lb

## 2020-08-13 DIAGNOSIS — R0609 Other forms of dyspnea: Secondary | ICD-10-CM

## 2020-08-13 DIAGNOSIS — I4819 Other persistent atrial fibrillation: Secondary | ICD-10-CM | POA: Diagnosis not present

## 2020-08-13 DIAGNOSIS — E785 Hyperlipidemia, unspecified: Secondary | ICD-10-CM

## 2020-08-13 DIAGNOSIS — R072 Precordial pain: Secondary | ICD-10-CM | POA: Diagnosis not present

## 2020-08-13 DIAGNOSIS — Z7901 Long term (current) use of anticoagulants: Secondary | ICD-10-CM | POA: Diagnosis not present

## 2020-08-13 DIAGNOSIS — Z6841 Body Mass Index (BMI) 40.0 and over, adult: Secondary | ICD-10-CM

## 2020-08-13 DIAGNOSIS — G4733 Obstructive sleep apnea (adult) (pediatric): Secondary | ICD-10-CM

## 2020-08-13 DIAGNOSIS — I10 Essential (primary) hypertension: Secondary | ICD-10-CM

## 2020-08-13 DIAGNOSIS — R0600 Dyspnea, unspecified: Secondary | ICD-10-CM

## 2020-08-13 DIAGNOSIS — I6523 Occlusion and stenosis of bilateral carotid arteries: Secondary | ICD-10-CM

## 2020-08-13 DIAGNOSIS — G459 Transient cerebral ischemic attack, unspecified: Secondary | ICD-10-CM

## 2020-08-13 NOTE — Addendum Note (Signed)
Addended by: Durward Mallard on: 08/13/2020 02:57 PM   Modules accepted: Orders

## 2020-08-22 ENCOUNTER — Other Ambulatory Visit: Payer: Self-pay

## 2020-08-22 ENCOUNTER — Ambulatory Visit: Payer: Medicare Other

## 2020-08-22 DIAGNOSIS — I6523 Occlusion and stenosis of bilateral carotid arteries: Secondary | ICD-10-CM

## 2020-08-22 DIAGNOSIS — I4819 Other persistent atrial fibrillation: Secondary | ICD-10-CM

## 2020-08-28 ENCOUNTER — Ambulatory Visit: Payer: Medicare Other

## 2020-08-28 ENCOUNTER — Other Ambulatory Visit: Payer: Self-pay

## 2020-08-28 DIAGNOSIS — I4819 Other persistent atrial fibrillation: Secondary | ICD-10-CM | POA: Diagnosis not present

## 2020-09-02 ENCOUNTER — Other Ambulatory Visit: Payer: Self-pay

## 2020-09-02 MED FILL — Rivaroxaban Tab 20 MG: ORAL | 30 days supply | Qty: 30 | Fill #2 | Status: AC

## 2020-09-09 ENCOUNTER — Encounter: Payer: Self-pay | Admitting: Cardiology

## 2020-09-09 ENCOUNTER — Other Ambulatory Visit: Payer: Self-pay

## 2020-09-09 ENCOUNTER — Ambulatory Visit: Payer: Medicare Other | Admitting: Cardiology

## 2020-09-09 VITALS — BP 144/83 | HR 71 | Temp 98.0°F | Resp 16 | Ht 66.0 in | Wt 287.0 lb

## 2020-09-09 DIAGNOSIS — R0609 Other forms of dyspnea: Secondary | ICD-10-CM

## 2020-09-09 DIAGNOSIS — Z7901 Long term (current) use of anticoagulants: Secondary | ICD-10-CM | POA: Diagnosis not present

## 2020-09-09 DIAGNOSIS — E785 Hyperlipidemia, unspecified: Secondary | ICD-10-CM

## 2020-09-09 DIAGNOSIS — I6523 Occlusion and stenosis of bilateral carotid arteries: Secondary | ICD-10-CM | POA: Diagnosis not present

## 2020-09-09 DIAGNOSIS — I10 Essential (primary) hypertension: Secondary | ICD-10-CM

## 2020-09-09 DIAGNOSIS — R072 Precordial pain: Secondary | ICD-10-CM

## 2020-09-09 DIAGNOSIS — I4819 Other persistent atrial fibrillation: Secondary | ICD-10-CM

## 2020-09-09 DIAGNOSIS — G4733 Obstructive sleep apnea (adult) (pediatric): Secondary | ICD-10-CM

## 2020-09-09 DIAGNOSIS — G459 Transient cerebral ischemic attack, unspecified: Secondary | ICD-10-CM

## 2020-09-09 DIAGNOSIS — Z6841 Body Mass Index (BMI) 40.0 and over, adult: Secondary | ICD-10-CM

## 2020-09-09 DIAGNOSIS — E66813 Obesity, class 3: Secondary | ICD-10-CM

## 2020-09-09 MED ORDER — METOPROLOL SUCCINATE ER 100 MG PO TB24: 100.0000 mg | ORAL_TABLET | Freq: Every morning | ORAL | 0 refills | Status: DC

## 2020-09-09 NOTE — Progress Notes (Signed)
Date:  09/09/2020   ID:  Edward Weaver, DOB [birth year], MRN [id number]  PCP:  Tamsen Roers, MD  Cardiologist:  Rex Kras, DO, [facility] (established care 08/13/2020)  Date: 09/09/20 Last Office Visit: 08/13/2020  Chief Complaint  Patient presents with   Atrial Fibrillation   Follow-up    4 WEEK   Results    Echo, stress, and lab results    HPI  Edward Weaver is a 65 y.o. male who presents to the office with a chief complaint of " 4-week follow-up and review test results." Patient's past medical history and cardiovascular risk factors include: OSA not CPAP, Atrial Fibrillation, TIA, Hypertension, Hyperlipidemia, bilateral carotid artery stenosis, obesity due to excess calories.  He is referred to the office at the request of Tamsen Roers, MD for evaluation of atrial fibrillation.  Patient is accompanied by his wife Edward Weaver at today's office visit.  Atrial fibrillation: Diagnosed back in 2018 and has been on AV nodal blocking agents and oral anticoagulation for thromboembolic prophylaxis.  He recalls undergoing direct-current cardioversion x1 but is not sure if it was successful to restore normal sinus rhythm.  Patient has no prior history of being on antiarrhythmic medications.  Does not endorse any evidence of bleeding.  And no prior history of gastrointestinal or intracranial bleeding.  Chest pain and shortness of breath: At the last office visit patient was describing precordial pain with possible cardiac etiology and therefore underwent an ischemic evaluation.  Echocardiogram notes a mildly reduced LVEF at 45-50% with mild valvular heart disease and elevated left atrial pressures.  And a Lexiscan stress test notes overall normal myocardial perfusion with diaphragmatic attenuation, low risk study.  Clinically patient states that he continues to have left-sided chest pain, at times walking reproduces the symptoms, does not improve with rest and usually self-limited, symptoms last for  few minutes.  Patient also underwent carotid duplex and is noted to have bilateral carotid artery stenosis in the range of 50-69%.  Patient states that he has been prescribed atorvastatin but is not on take it.  Patient has gained approximately 2 pounds since last office visit.  Patient is also requesting a referral to sleep medicine to be reevaluated for obstructive sleep apnea.  He states that he did have history/diagnosis of OSA but chose not to be on CPAP.  However given his progressive shortness of breath and precordial pain he is reconsidering being treated for sleep apnea.  FUNCTIONAL STATUS: No structured exercise program or daily routine.   ALLERGIES: Allergies  Allergen Reactions   Penicillins Other (See Comments)    Syncope, hypotension    MEDICATION LIST PRIOR TO VISIT: Current Meds  Medication Sig   gabapentin (NEURONTIN) 300 MG capsule TAKE 1 CAPSULE (300 MG TOTAL) BY MOUTH 3 (THREE) TIMES DAILY.   metoprolol succinate (TOPROL XL) 100 MG 24 hr tablet Take 1 tablet (100 mg total) by mouth every morning.   rivaroxaban (XARELTO) 20 MG TABS tablet TAKE 1 TABLET (20 MG TOTAL) BY MOUTH DAILY.   [DISCONTINUED] diltiazem (CARDIZEM CD) 120 MG 24 hr capsule TAKE 1 CAPSULE (120 MG TOTAL) BY MOUTH DAILY.     PAST MEDICAL HISTORY: Past Medical History:  Diagnosis Date   Arthritis    Atrial fibrillation (HCC)    Bell's palsy    Carotid artery stenosis, asymptomatic, bilateral    Edema    Hiatal hernia    Hyperlipidemia    Hypertension    Stroke [facility])    TIA (transient ischemic  attack)    Nov and/or Dec 2014    PAST SURGICAL HISTORY: Past Surgical History:  Procedure Laterality Date   ESOPHAGUS SURGERY     KNEE SURGERY Right    1974   TONSILLECTOMY     WISDOM TOOTH EXTRACTION      FAMILY HISTORY: The patient family history includes Alzheimer's disease in his father and paternal aunt; Cancer - Other in his mother; Dementia in his paternal aunt; Healthy in his  brother and son; Heart disease in his mother; Hypertension in his father and mother; Stroke in his paternal aunt; Stroke (age of onset: 9) in his brother.  SOCIAL HISTORY:  The patient  reports that he has never smoked. He has never used smokeless tobacco. He reports that he does not drink alcohol and does not use drugs.  REVIEW OF SYSTEMS: Review of Systems  Constitutional: Negative for chills and fever.  HENT:  Negative for hoarse voice and nosebleeds.   Eyes:  Negative for discharge, double vision and pain.  Cardiovascular:  Positive for chest pain and dyspnea on exertion. Negative for claudication, leg swelling, near-syncope, orthopnea, palpitations, paroxysmal nocturnal dyspnea and syncope.  Respiratory:  Positive for shortness of breath and snoring. Negative for hemoptysis.   Musculoskeletal:  Negative for muscle cramps and myalgias.  Gastrointestinal:  Negative for abdominal pain, constipation, diarrhea, hematemesis, hematochezia, melena, nausea and vomiting.  Neurological:  Negative for dizziness and light-headedness.   PHYSICAL EXAM: Vitals with BMI 09/09/2020 08/13/2020 09/15/2019  Height 5' 6"  5' 6"  -  Weight 287 lbs 285 lbs -  BMI 45.40 98.11 -  Systolic 914 782 956  Diastolic 83 82 91  Pulse 71 78 -    CONSTITUTIONAL: Appears older than stated age, hemodynamically stable, no acute distress.    SKIN: Skin is warm and dry. No rash noted. No cyanosis. No pallor. No jaundice HEAD: Normocephalic and atraumatic.  EYES: No scleral icterus MOUTH/THROAT: Moist oral membranes.  NECK: Unable to evaluate JVP due to a short neck stature, no JVD present. No thyromegaly noted. No carotid bruits  LYMPHATIC: No visible cervical adenopathy.  CHEST Normal respiratory effort. No intercostal retractions  LUNGS: Clear to auscultation bilaterally with decreased breath sounds at the bases.  No stridor. No wheezes. No rales.  CARDIOVASCULAR: Irregularly irregular, variable S1-S2, no murmurs rubs  or gallops appreciated. ABDOMINAL: Obese, soft, nontender, nondistended, positive bowel sounds in all 4 quadrants, no abdominal bruits appreciated.  No apparent ascites.  EXTREMITIES: +2 pitting edema bilaterally, findings of chronic venous insufficiency, +2 dorsalis pedis and posterior tibial pulses, poor nail hygiene.   HEMATOLOGIC: No significant bruising NEUROLOGIC: Oriented to person, place, and time. Nonfocal. Normal muscle tone.  PSYCHIATRIC: Normal mood and affect. Normal behavior. Cooperative  CARDIAC DATABASE: EKG: 08/13/2020: Probable atrial fibrillation, 71 bpm, without underlying ischemia or injury pattern.  Echocardiogram: 08/22/2020: Study Quality: Technically difficult study. Mildly depressed LV systolic function with visual EF 45-50%. Left ventricle cavity is normal in size. Moderate left ventricular hypertrophy. Normal global wall motion. Unable to evaluate diastolic function due to atrial fibrillation. Elevated LAP. Left atrial cavity is moderately dilated. Mild (Grade I) mitral regurgitation. Mild tricuspid regurgitation. IVC is normal with a respiratory response of <50%. Prior study dated 01/11/2017: LVEF 50-55%, mild LVH, no RWMA, mild LAE, mildRAE, mildly dilated RV.  Stress Testing: Lexiscan Tetrofosmin stress test 08/28/2020: Stress EKG is non-diagnostic, as this is pharmacological stress test using Lexiscan. Stress EKG is non-diagnostic, as this is pharmacological stress test. Rest and stress EKG  showed atrial fibrillation, no ST-T abnormality. SPECT images show normal stress myocardial perfusion. Decreased tracer uptake in inferior myocardium at rest likely due diaphragmatic attenuation. Stress LVEF calculated 48%, but visually appears normal. Low risk study.  Heart Catheterization: None   Carotid Duplex: Carotid artery duplex 08/22/2020: Stenosis in the right internal carotid artery (50-69%). Stenosis in the right external carotid artery (<50%). Stenosis in  the left internal carotid artery (50-69%). Stenosis in the left external carotid artery (<50%). There is mostly homogeneous plaque in bilateral carotid arteries. Antegrade right vertebral artery flow. Antegrade left vertebral artery flow. Compared to the study done on 01/11/2017, there is mild progression of disease in the right ICA.  Follow up in six months is appropriate if clinicallyindicated.  LABORATORY DATA: CBC Latest Ref Rng & Units 04/24/2019 10/15/2017 03/25/2017  WBC 3.4 - 10.8 x10E3/uL 6.2 6.0 6.2  Hemoglobin 13.0 - 17.7 g/dL 14.9 14.8 14.4  Hematocrit 37.5 - 51.0 % 44.4 45.1 42.0  Platelets 150 - 450 x10E3/uL 317 302 305    CMP Latest Ref Rng & Units 04/24/2019 05/19/2018 10/15/2017  Glucose 65 - 99 mg/dL 109(H) 102(H) 104(H)  BUN 8 - 27 mg/dL 16 14 16   Creatinine 0.76 - 1.27 mg/dL 1.02 1.12 1.06  Sodium 134 - 144 mmol/L 138 141 140  Potassium 3.5 - 5.2 mmol/L 4.6 4.2 4.5  Chloride 96 - 106 mmol/L 103 106 103  CO2 20 - 29 mmol/L 21 23 22   Calcium 8.6 - 10.2 mg/dL 9.4 9.3 9.7  Total Protein 6.0 - 8.5 g/dL 7.1 7.0 6.8  Total Bilirubin 0.0 - 1.2 mg/dL 0.5 0.5 0.5  Alkaline Phos 39 - 117 IU/L 82 70 76  AST 0 - 40 IU/L 13 12 9   ALT 0 - 44 IU/L 26 25 24     Lipid Panel  Lab Results  Component Value Date   CHOL 226 (H) 04/24/2019   HDL 46 04/24/2019   LDLCALC 134 (H) 04/24/2019   TRIG 255 (H) 04/24/2019   CHOLHDL 5.5 01/11/2017    No components found for: NTPROBNP No results for input(s): PROBNP in the last 8760 hours. No results for input(s): TSH in the last 8760 hours.  BMP No results for input(s): NA, K, CL, CO2, GLUCOSE, BUN, CREATININE, CALCIUM, GFRNONAA, GFRAA in the last 8760 hours.  HEMOGLOBIN A1C Lab Results  Component Value Date   HGBA1C 5.9 (A) 05/19/2018   MPG 125.5 01/10/2017   External Labs: Collected: 08/06/2020 provided by PCP Creatinine 1 mg/dL. eGFR: 79 mL/min per 1.73 m Lipid profile: Total cholesterol 218, triglycerides 269, HDL 45, LDL 132, non  HDL 173 Hemoglobin A1c: 6 TSH: 2.06  IMPRESSION:    ICD-10-CM   1. Persistent atrial fibrillation (HCC)  I48.19 metoprolol succinate (TOPROL XL) 100 MG 24 hr tablet    2. Long term (current) use of anticoagulants  Z79.01     3. Bilateral carotid artery stenosis  I65.23 Lipid Panel With LDL/HDL Ratio    LDL cholesterol, direct    PCV CAROTID DUPLEX (BILATERAL)    4. TIA (transient ischemic attack)  G45.9     5. Precordial pain  R07.2     6. Dyspnea on exertion  R06.00 CMP14+EGFR    Pro b natriuretic peptide (BNP)    Ambulatory referral to Neurology    7. Benign hypertension  I10     8. Dyslipidemia  E78.5     9. OSA (obstructive sleep apnea)  G47.33 Ambulatory referral to Neurology    10. Class  3 severe obesity due to excess calories with serious comorbidity and body mass index (BMI) of 40.0 to 44.9 in adult [facility])  E66.01    Z68.41        RECOMMENDATIONS: Edward Weaver is a 65 y.o. male whose past medical history and cardiac risk factors include: Prediabetes, OSA not CPAP, Atrial Fibrillation, TIA, Hypertension, Hyperlipidemia, bilateral carotid artery stenosis, obesity due to excess calories.  Atrial fibrillation Persistent, diagnosed in 2018. Rate control: Toprol-XL. Rhythm control: N/A. Thromboembolic prophylaxis: Xarelto States that he may have been cardioverted once in the past but does not recall if it restored normal sinus rhythm. No prior history of antiarrhythmic medications or atrial fibrillation ablation. He has been evaluated in A. fib clinic back in 2020 but lost to follow-up due to lack of insurance. Recent echocardiogram notes mildly reduced LVEF and therefore will discontinue diltiazem and initiate Toprol-XL. Ideally would like to start antiarrhythmic medications prior to considering direct-current cardioversion.  We will discuss this further at the next visit.  Long-term oral anticoagulation: Indication: Persistent atrial fibrillation. Has remained  on Xarelto since 2018 and does not endorse any evidence of bleeding.   No history of intracranial bleeding or gastrointestinal bleeding. Risks, benefits, and alternatives to oral anticoagulation discussed.  Patient verbalizes understanding. Most recent hemoglobin 14.9 g/dL (February 2021). Most recent serum creatinine clearance greater than 50 mL/min.  Precordial pain: Is discomfort has both cardiac and noncardiac discomfort and given his multiple cardiovascular risk factors I have asked him to seek medical attention by going to the closest ER via EMS if his symptoms were to increase in intensity, frequency, duration or has typical chest pain as discussed at today's visit.  Both patient and wife voiced understanding and provide verbal feedback. He has undergone an ischemic evaluation including an echo and stress test as noted above.   Close monitoring.   Recommended risk factor modifications including but not limited to: Glycemic control, lipid and carbohydrate management, weight loss, hypertension.  Dyspnea on exertion: Given the mildly reduced LVEF, orthopnea, lower extremity swelling, dyspnea on exertion I suspect that he has HFrEF. Will check blood work. Plan to initiate Entresto once labs come back.  Echo and stress test results reviewed with the patient and his wife at today's visit.  Benign essential hypertension: Office blood pressures within acceptable range but not at goal.  Patient states that his diet is high in canned foods and frozen foods.  He is asked to avoid high salt content in his diet. Currently managed by primary care provider.  Hyperlipidemia:  Currently prescribed statin therapy, but chooses not to take it.   Check fasting lipid profile including direct LDL Given his bilateral carotid artery stenosis in the range of 50 to 69% educated on importance of cholesterol management.  Patient is agreeable to take statin therapy once labs are completed.  Bilateral carotid  artery stenosis: Noted to have bilateral carotid artery stenosis around 50-69% stenosis. Currently asymptomatic.   Will hold off aspirin 81 mg p.o. daily as the patient is on oral anticoagulation.   Recommend statin therapy as discussed above  Will require 13-monthfollow-up study to reevaluate disease progression.  Sleep apnea: Patient states that he had a prior diagnosis of obstructive sleep apnea but chose not to be on CPAP. However, given his symptoms as discussed above and multiple cardiovascular conditions patient is encouraged and motivated to be more compliant with CPAP if it is clinically indicated.   Patient requesting a referral to sleep medicine. Will  refer the patient to Dr. Brett Fairy for further evaluation and management of sleep apnea.  FINAL MEDICATION LIST END OF ENCOUNTER: Meds ordered this encounter  Medications   metoprolol succinate (TOPROL XL) 100 MG 24 hr tablet    Sig: Take 1 tablet (100 mg total) by mouth every morning.    Dispense:  30 tablet    Refill:  0    Medications Discontinued During This Encounter  Medication Reason   diltiazem (CARDIZEM CD) 120 MG 24 hr capsule Discontinued by provider     Current Outpatient Medications:    gabapentin (NEURONTIN) 300 MG capsule, TAKE 1 CAPSULE (300 MG TOTAL) BY MOUTH 3 (THREE) TIMES DAILY., Disp: 90 capsule, Rfl: 3   metoprolol succinate (TOPROL XL) 100 MG 24 hr tablet, Take 1 tablet (100 mg total) by mouth every morning., Disp: 30 tablet, Rfl: 0   rivaroxaban (XARELTO) 20 MG TABS tablet, TAKE 1 TABLET (20 MG TOTAL) BY MOUTH DAILY., Disp: 90 tablet, Rfl: 3   atorvastatin (LIPITOR) 40 MG tablet, Take 1 tablet (40 mg total) by mouth daily at 6 PM., Disp: 90 tablet, Rfl: 0   furosemide (LASIX) 40 MG tablet, TAKE 1 TABLET (40 MG TOTAL) BY MOUTH DAILY., Disp: 30 tablet, Rfl: 3   valACYclovir (VALTREX) 1000 MG tablet, Take 1 tablet (1,000 mg total) by mouth daily., Disp: 5 tablet, Rfl: 0  Orders Placed This Encounter   Procedures   Lipid Panel With LDL/HDL Ratio   LDL cholesterol, direct   CMP14+EGFR   Pro b natriuretic peptide (BNP)   Ambulatory referral to Neurology   PCV CAROTID DUPLEX (BILATERAL)    There are no Patient Instructions on file for this visit.   --Continue cardiac medications as reconciled in final medication list. --Return in about 4 weeks (around 10/07/2020) for Follow up, Dyspnea. Or sooner if needed. --Continue follow-up with your primary care physician regarding the management of your other chronic comorbid conditions.  Patient's questions and concerns were addressed to his satisfaction. He voices understanding of the instructions provided during this encounter.   This note was created using a voice recognition software as a result there may be grammatical errors inadvertently enclosed that do not reflect the nature of this encounter. Every attempt is made to correct such errors.  Total time spent: 40 minutes discussing test results of the echo, stress test, carotid duplex.  Reevaluating symptoms.  Medications reconciliation, discussing disease management, coordination of care.   Rex Kras, [place], [facility]  Pager: [phone] Office: [phone]

## 2020-09-10 DIAGNOSIS — R0600 Dyspnea, unspecified: Secondary | ICD-10-CM | POA: Diagnosis not present

## 2020-09-10 DIAGNOSIS — I6523 Occlusion and stenosis of bilateral carotid arteries: Secondary | ICD-10-CM | POA: Diagnosis not present

## 2020-09-11 ENCOUNTER — Telehealth: Payer: Self-pay

## 2020-09-11 LAB — CMP14+EGFR
ALT: 35 IU/L (ref 0–44)
AST: 16 IU/L (ref 0–40)
Albumin/Globulin Ratio: 1.6 (ref 1.2–2.2)
Albumin: 4 g/dL (ref 3.8–4.8)
Alkaline Phosphatase: 70 IU/L (ref 44–121)
BUN/Creatinine Ratio: 10 (ref 10–24)
BUN: 11 mg/dL (ref 8–27)
Bilirubin Total: 0.5 mg/dL (ref 0.0–1.2)
CO2: 26 mmol/L (ref 20–29)
Calcium: 9 mg/dL (ref 8.6–10.2)
Chloride: 103 mmol/L (ref 96–106)
Creatinine, Ser: 1.08 mg/dL (ref 0.76–1.27)
Globulin, Total: 2.5 g/dL (ref 1.5–4.5)
Glucose: 103 mg/dL — ABNORMAL HIGH (ref 65–99)
Potassium: 4.9 mmol/L (ref 3.5–5.2)
Sodium: 140 mmol/L (ref 134–144)
Total Protein: 6.5 g/dL (ref 6.0–8.5)
eGFR: 76 mL/min/{1.73_m2} (ref >59)

## 2020-09-11 LAB — LIPID PANEL WITH LDL/HDL RATIO
Cholesterol, Total: 225 mg/dL — ABNORMAL HIGH (ref 100–199)
HDL: 46 mg/dL (ref >39)
LDL Chol Calc (NIH): 141 mg/dL — ABNORMAL HIGH (ref 0–99)
LDL/HDL Ratio: 3.1 ratio (ref 0.0–3.6)
Triglycerides: 211 mg/dL — ABNORMAL HIGH (ref 0–149)
VLDL Cholesterol Cal: 38 mg/dL (ref 5–40)

## 2020-09-11 LAB — PRO B NATRIURETIC PEPTIDE: NT-Pro BNP: 980 pg/mL — ABNORMAL HIGH (ref 0–376)

## 2020-09-11 LAB — LDL CHOLESTEROL, DIRECT: LDL Direct: 149 mg/dL — ABNORMAL HIGH (ref 0–99)

## 2020-09-11 NOTE — Telephone Encounter (Signed)
Patient wife [name] called stating that patient is feeling lethargic and just wants to sleep all day. She wants to know if she should take him to the hospital, or can he just be seen with you. She thinks that he is building up fluid in his lungs and the swelling is worsening and is not currently on fluid pill because they were waiting on lab results. Please advise.   [name] (spouse) [phone]

## 2020-09-13 ENCOUNTER — Other Ambulatory Visit: Payer: Self-pay | Admitting: Cardiology

## 2020-09-13 DIAGNOSIS — R0609 Other forms of dyspnea: Secondary | ICD-10-CM

## 2020-09-13 MED ORDER — ENTRESTO 49-51 MG PO TABS: 1.0000 | ORAL_TABLET | Freq: Two times a day (BID) | ORAL | 0 refills | Status: AC

## 2020-09-13 NOTE — Telephone Encounter (Signed)
Please see the result note.   He is already on lasix which is water fluid.

## 2020-09-13 NOTE — Telephone Encounter (Signed)
Already responded in another message.

## 2020-09-13 NOTE — Progress Notes (Signed)
Called NA, LMAM

## 2020-09-17 ENCOUNTER — Ambulatory Visit: Payer: Medicare Other | Admitting: Cardiology

## 2020-09-18 NOTE — Progress Notes (Signed)
Attempted to call pt, no answer. Left vm requesting call back.

## 2020-09-19 ENCOUNTER — Other Ambulatory Visit: Payer: Self-pay

## 2020-09-19 NOTE — Progress Notes (Signed)
Spoke to patient he voiced understanding and is aware to go get labs

## 2020-09-23 ENCOUNTER — Other Ambulatory Visit: Payer: Self-pay

## 2020-09-23 DIAGNOSIS — I482 Chronic atrial fibrillation, unspecified: Secondary | ICD-10-CM | POA: Diagnosis not present

## 2020-09-23 DIAGNOSIS — G629 Polyneuropathy, unspecified: Secondary | ICD-10-CM | POA: Diagnosis not present

## 2020-09-25 ENCOUNTER — Other Ambulatory Visit: Payer: Self-pay

## 2020-09-27 ENCOUNTER — Other Ambulatory Visit: Payer: Self-pay

## 2020-09-29 ENCOUNTER — Other Ambulatory Visit: Payer: Self-pay

## 2020-09-29 ENCOUNTER — Emergency Department (HOSPITAL_BASED_OUTPATIENT_CLINIC_OR_DEPARTMENT_OTHER): Payer: Medicare Other

## 2020-09-29 ENCOUNTER — Encounter (HOSPITAL_BASED_OUTPATIENT_CLINIC_OR_DEPARTMENT_OTHER): Payer: Self-pay | Admitting: Emergency Medicine

## 2020-09-29 ENCOUNTER — Emergency Department (HOSPITAL_BASED_OUTPATIENT_CLINIC_OR_DEPARTMENT_OTHER)
Admission: EM | Admit: 2020-09-29 | Discharge: 2020-09-29 | Disposition: A | Discharge status: Home / Self Care | Payer: Medicare Other | Attending: Emergency Medicine | Admitting: Emergency Medicine

## 2020-09-29 DIAGNOSIS — Z79899 Other long term (current) drug therapy: Secondary | ICD-10-CM | POA: Insufficient documentation

## 2020-09-29 DIAGNOSIS — I10 Essential (primary) hypertension: Secondary | ICD-10-CM | POA: Insufficient documentation

## 2020-09-29 DIAGNOSIS — M25561 Pain in right knee: Secondary | ICD-10-CM | POA: Insufficient documentation

## 2020-09-29 DIAGNOSIS — Z7901 Long term (current) use of anticoagulants: Secondary | ICD-10-CM | POA: Insufficient documentation

## 2020-09-29 NOTE — Discharge Instructions (Addendum)
Take 4 over the counter ibuprofen tablets 3 times a day or 2 over-the-counter naproxen tablets twice a day for pain. Also take tylenol 1000mg (2 extra strength) four times a day.   Your x-ray did not show fracture or dislodgments of your prior screw.  I have given you follow-up for orthopedics, I have also given you follow-up with sports medicine if you prefer.

## 2020-09-29 NOTE — ED Triage Notes (Signed)
Reports right knee pain for about three weeks.  Reports especially when he twists it.  Also reports some fluid on his ankles.  To start back on lasix tomorrow.

## 2020-09-29 NOTE — ED Provider Notes (Signed)
[facility] EMERGENCY DEPARTMENT Provider Note   CSN: [id number] Arrival date & time: 09/29/20  2023     History Chief Complaint  Patient presents with   Knee Pain    Edward Weaver is a 65 y.o. male.  65 yo M with a chief complaints of right knee pain.  This been ongoing for couple weeks.  Patient has a history of an injury to that knee and had a ligamentous repair done in the 70s.  He has sharp pain that lasts for seconds usually at the onset of ambulation.  Thinks he might of twisted the knee but denies any obvious events of injury.  The history is provided by the patient.  Knee Pain Location:  Knee Time since incident:  3 weeks Injury: no   Knee location:  R knee Pain details:    Quality:  Aching and sharp   Radiates to:  Does not radiate   Severity:  Moderate   Onset quality:  Gradual   Duration:  3 weeks   Timing:  Intermittent   Progression:  Waxing and waning Chronicity:  New Prior injury to area:  Yes Relieved by:  Nothing Worsened by:  Bearing weight Ineffective treatments:  None tried Associated symptoms: no fever       Past Medical History:  Diagnosis Date   Arthritis    Atrial fibrillation (HCC)    Bell's palsy    Carotid artery stenosis, asymptomatic, bilateral    Edema    Hiatal hernia    Hyperlipidemia    Hypertension    Stroke (HCC)    TIA (transient ischemic attack)    Nov and/or Dec 2014    Patient Active Problem List   Diagnosis Date Noted   Dyslipidemia 05/01/2019   Neuropathy 03/08/2017   Dizziness 03/08/2017   History of atrial fibrillation 03/08/2017   A-fib (HCC) 01/10/2017   Obesity 01/10/2017   Anxiety 08/02/2013   TIA (transient ischemic attack) 08/02/2013   Visit for preventive health examination 07/24/2013   OA (osteoarthritis) of knee 07/24/2013   Venous insufficiency of right lower extremity 07/24/2013   Pain in joint of right ankle or foot 05/14/2011   Osteochondral defect of talus 05/14/2011    Past  Surgical History:  Procedure Laterality Date   ESOPHAGUS SURGERY     KNEE SURGERY Right    1974   TONSILLECTOMY     WISDOM TOOTH EXTRACTION         Family History  Problem Relation Age of Onset   Hypertension Mother        Living   Cancer - Other Mother        Oral   Heart disease Mother    Hypertension Father    Alzheimer's disease Father        Living   Stroke Brother 71       Deceased   Alzheimer's disease Paternal Aunt    Stroke Paternal Aunt        #1   Dementia Paternal Aunt        #2   Healthy Brother        x1   Healthy Son        x3    Social History   Tobacco Use   Smoking status: Never   Smokeless tobacco: Never  Vaping Use   Vaping Use: Never used  Substance Use Topics   Alcohol use: No   Drug use: No    Home Medications Prior to Admission  medications   Medication Sig Start Date End Date Taking? Authorizing Provider  atorvastatin (LIPITOR) 40 MG tablet Take 1 tablet (40 mg total) by mouth daily at 6 PM. 05/01/19 09/09/20  Barbette Merino, NP  furosemide (LASIX) 40 MG tablet TAKE 1 TABLET (40 MG TOTAL) BY MOUTH DAILY. 10/12/19   Kallie Locks, FNP  gabapentin (NEURONTIN) 300 MG capsule TAKE 1 CAPSULE (300 MG TOTAL) BY MOUTH 3 (THREE) TIMES DAILY. 01/17/20 01/16/21  Barbette Merino, NP  metoprolol succinate (TOPROL XL) 100 MG 24 hr tablet Take 1 tablet (100 mg total) by mouth every morning. 09/09/20 10/09/20  Tolia, Sunit, DO  rivaroxaban (XARELTO) 20 MG TABS tablet TAKE 1 TABLET (20 MG TOTAL) BY MOUTH DAILY. 01/17/20 01/16/21  Barbette Merino, NP  sacubitril-valsartan (ENTRESTO) 49-51 MG Take 1 tablet by mouth 2 (two) times daily. 09/13/20 10/13/20  Tolia, Sunit, DO  valACYclovir (VALTREX) 1000 MG tablet Take 1 tablet (1,000 mg total) by mouth daily. 05/05/17   Massie Maroon, FNP    Allergies    Penicillins  Review of Systems   Review of Systems  Constitutional:  Negative for chills and fever.  HENT:  Negative for congestion and facial swelling.    Eyes:  Negative for discharge and visual disturbance.  Respiratory:  Negative for shortness of breath.   Cardiovascular:  Negative for chest pain and palpitations.  Gastrointestinal:  Negative for abdominal pain, diarrhea and vomiting.  Musculoskeletal:  Positive for arthralgias. Negative for myalgias.  Skin:  Negative for color change and rash.  Neurological:  Negative for tremors, syncope and headaches.  Psychiatric/Behavioral:  Negative for confusion and dysphoric mood.    Physical Exam Updated Vital Signs BP 137/83 (BP Location: Left Arm)   Pulse (!) 55   Temp 98.7 F (37.1 C) (Oral)   Resp 20   Ht 5\' 6"  (1.676 m)   Wt 127 kg   SpO2 98%   BMI 45.19 kg/m   Physical Exam Vitals and nursing note reviewed.  Constitutional:      Appearance: He is well-developed.  HENT:     Head: Normocephalic and atraumatic.  Eyes:     Pupils: Pupils are equal, round, and reactive to light.  Neck:     Vascular: No JVD.  Cardiovascular:     Rate and Rhythm: Normal rate and regular rhythm.     Heart sounds: No murmur heard.   No friction rub. No gallop.  Pulmonary:     Effort: No respiratory distress.     Breath sounds: No wheezing.  Abdominal:     General: There is no distension.     Tenderness: There is no abdominal tenderness. There is no guarding or rebound.  Musculoskeletal:        General: Normal range of motion.     Cervical back: Normal range of motion and neck supple.     Comments: Palpable hardware in the right knee.  No joint effusion no significant pain with range of motion.  No obvious ligamentous laxity.  Negative McMurray's test.  Skin:    Coloration: Skin is not pale.     Findings: No rash.  Neurological:     Mental Status: He is alert and oriented to person, place, and time.  Psychiatric:        Behavior: Behavior normal.    ED Results / Procedures / Treatments   Labs (all labs ordered are listed, but only abnormal results are displayed) Labs Reviewed - No  data to  display  EKG None  Radiology DG Knee Complete 4 Views Right  Result Date: 09/29/2020 CLINICAL DATA:  Right knee pain x3 weeks EXAM: RIGHT KNEE - COMPLETE 4+ VIEW COMPARISON:  None. FINDINGS: No fracture or dislocation is seen. Mild tricompartmental degenerative changes, most prominent in the patellofemoral compartment. Postprocedural changes with a screw along the anterior tibia. No definite suprapatellar knee joint effusion. IMPRESSION: No fracture or dislocation is seen. Mild degenerative changes. Electronically Signed   By: Charline Bills M.D.   On: 09/29/2020 22:07    Procedures Procedures   Medications Ordered in ED Medications - No data to display  ED Course  I have reviewed the triage vital signs and the nursing notes.  Pertinent labs & imaging results that were available during my care of the patient were reviewed by me and considered in my medical decision making (see chart for details).    MDM Rules/Calculators/A&P                          65 yo M with a chief complaint of right knee pain.  Patient is worried the screw that was placed in the 70s may have come loose.  Plain film viewed by me without obvious dislodgment no fracture.  We will have the patient follow-up with orthopedics or sports medicine.  10:15 PM:  I have discussed the diagnosis/risks/treatment options with the patient and believe the pt to be eligible for discharge home to follow-up with Ortho. We also discussed returning to the ED immediately if new or worsening sx occur. We discussed the sx which are most concerning (e.g., sudden worsening pain, fever, inability to tolerate by mouth) that necessitate immediate return. Medications administered to the patient during their visit and any new prescriptions provided to the patient are listed below.  Medications given during this visit Medications - No data to display   The patient appears reasonably screen and/or stabilized for discharge and I doubt  any other medical condition or other [facility] requiring further screening, evaluation, or treatment in the ED at this time prior to discharge.   Final Clinical Impression(s) / ED Diagnoses Final diagnoses:  Acute pain of right knee    Rx / DC Orders ED Discharge Orders     None        Melene Plan, DO 09/29/20 2215

## 2020-09-29 NOTE — ED Notes (Signed)
Pt provided discharge instructions and prescription information. Pt was given the opportunity to ask questions and questions were answered. Discharge signature not obtained in the setting of the COVID-19 pandemic in order to reduce high touch surfaces.  ° °

## 2020-10-01 ENCOUNTER — Other Ambulatory Visit: Payer: Self-pay

## 2020-10-03 ENCOUNTER — Ambulatory Visit: Payer: Medicare Other | Admitting: Family Medicine

## 2020-10-03 NOTE — Progress Notes (Deleted)
  Edward Weaver - 65 y.o. male MRN [id number]  Date of birth: 10/05/1955  SUBJECTIVE:  Including CC & ROS.  No chief complaint on file.   Edward Weaver is a 65 y.o. male that is  ***.  ***   Review of Systems See HPI   HISTORY: Past Medical, Surgical, Social, and Family History Reviewed & Updated per EMR.   Pertinent Historical Findings include:  Past Medical History:  Diagnosis Date   Arthritis    Atrial fibrillation (HCC)    Bell's palsy    Carotid artery stenosis, asymptomatic, bilateral    Edema    Hiatal hernia    Hyperlipidemia    Hypertension    Stroke [facility])    TIA (transient ischemic attack)    Nov and/or Dec 2014    Past Surgical History:  Procedure Laterality Date   ESOPHAGUS SURGERY     KNEE SURGERY Right    1974   TONSILLECTOMY     WISDOM TOOTH EXTRACTION      Family History  Problem Relation Age of Onset   Hypertension Mother        Living   Cancer - Other Mother        Oral   Heart disease Mother    Hypertension Father    Alzheimer's disease Father        Living   Stroke Brother 75       Deceased   Alzheimer's disease Paternal Aunt    Stroke Paternal Aunt        #1   Dementia Paternal Aunt        #2   Healthy Brother        x1   Healthy Son        x3    Social History   Socioeconomic History   Marital status: Married    Spouse name: Not on file   Number of children: 3   Years of education: Not on file   Highest education level: Not on file  Occupational History   Not on file  Tobacco Use   Smoking status: Never   Smokeless tobacco: Never  Vaping Use   Vaping Use: Never used  Substance and Sexual Activity   Alcohol use: No   Drug use: No   Sexual activity: Yes    Birth control/protection: None  Other Topics Concern   Not on file  Social History Narrative   Not on file   Social Determinants of Health   Financial Resource Strain: Not on file  Food Insecurity: Not on file  Transportation Needs: Not on file   Physical Activity: Not on file  Stress: Not on file  Social Connections: Not on file  Intimate Partner Violence: Not on file     PHYSICAL EXAM:  VS: There were no vitals taken for this visit. Physical Exam Gen: NAD, alert, cooperative with exam, well-appearing MSK:  ***      ASSESSMENT & PLAN:   No problem-specific Assessment & Plan notes found for this encounter.

## 2020-10-04 NOTE — Progress Notes (Signed)
Patient didn't start Sherryll Burger till this past Wednesday 7/20 therefore he hasn't went to get his labs done patient had an upcoming appt 7/25 it was cancelled so patient can go get his blood work done 7/27 patient stated he will call back once he knows his wife schedule.

## 2020-10-07 ENCOUNTER — Ambulatory Visit: Payer: Medicare Other | Admitting: Cardiology

## 2020-10-09 ENCOUNTER — Other Ambulatory Visit: Payer: Self-pay

## 2020-10-09 ENCOUNTER — Encounter: Payer: Self-pay | Admitting: Family Medicine

## 2020-10-09 ENCOUNTER — Ambulatory Visit: Payer: Self-pay

## 2020-10-09 ENCOUNTER — Ambulatory Visit (INDEPENDENT_AMBULATORY_CARE_PROVIDER_SITE_OTHER): Payer: Medicare Other | Admitting: Family Medicine

## 2020-10-09 VITALS — BP 118/82 | Ht 66.0 in | Wt 280.0 lb

## 2020-10-09 DIAGNOSIS — M1731 Unilateral post-traumatic osteoarthritis, right knee: Secondary | ICD-10-CM | POA: Diagnosis not present

## 2020-10-09 DIAGNOSIS — M25561 Pain in right knee: Secondary | ICD-10-CM

## 2020-10-09 MED ORDER — DICLOFENAC SODIUM 1 % EX GEL: 4.0000 g | Freq: Four times a day (QID) | CUTANEOUS | 11 refills | Status: DC

## 2020-10-09 NOTE — Progress Notes (Signed)
Edward Weaver - 65 y.o. male MRN [id number]  Date of birth: 1955/07/06  SUBJECTIVE:  Including CC & ROS.  No chief complaint on file.   Edward Weaver is a 65 y.o. male that is presenting with acute right knee pain.  He was doing a lot of walking over last weekend and felt significant pain.  It was occurring on the medial compartment.  He has a history of tibial plateau fracture when he was 65 years old.  Has got improvement today.  Feels like he has to limp from time to time..  Independent review of the right knee x-ray from 7/17 shows a postsurgical screw and mild medial joint space narrowing.   Review of Systems See HPI   HISTORY: Past Medical, Surgical, Social, and Family History Reviewed & Updated per EMR.   Pertinent Historical Findings include:  Past Medical History:  Diagnosis Date   Arthritis    Atrial fibrillation (HCC)    Bell's palsy    Carotid artery stenosis, asymptomatic, bilateral    Edema    Hiatal hernia    Hyperlipidemia    Hypertension    Stroke [facility])    TIA (transient ischemic attack)    Nov and/or Dec 2014    Past Surgical History:  Procedure Laterality Date   ESOPHAGUS SURGERY     KNEE SURGERY Right    1974   TONSILLECTOMY     WISDOM TOOTH EXTRACTION      Family History  Problem Relation Age of Onset   Hypertension Mother        Living   Cancer - Other Mother        Oral   Heart disease Mother    Hypertension Father    Alzheimer's disease Father        Living   Stroke Brother 23       Deceased   Alzheimer's disease Paternal Aunt    Stroke Paternal Aunt        #1   Dementia Paternal Aunt        #2   Healthy Brother        x1   Healthy Son        x3    Social History   Socioeconomic History   Marital status: Married    Spouse name: Not on file   Number of children: 3   Years of education: Not on file   Highest education level: Not on file  Occupational History   Not on file  Tobacco Use   Smoking status: Never    Smokeless tobacco: Never  Vaping Use   Vaping Use: Never used  Substance and Sexual Activity   Alcohol use: No   Drug use: No   Sexual activity: Yes    Birth control/protection: None  Other Topics Concern   Not on file  Social History Narrative   Not on file   Social Determinants of Health   Financial Resource Strain: Not on file  Food Insecurity: Not on file  Transportation Needs: Not on file  Physical Activity: Not on file  Stress: Not on file  Social Connections: Not on file  Intimate Partner Violence: Not on file     PHYSICAL EXAM:  VS: BP 118/82 (BP Location: Left Arm, Patient Position: Sitting, Cuff Size: Large)   Ht 5\' 6"  (1.676 m)   Wt 280 lb (127 kg)   BMI 45.19 kg/m  Physical Exam Gen: NAD, alert, cooperative with exam, well-appearing MSK:  Right  knee: Normal range of motion. Instability valgus and varus stress testing. Neurovascular intact  Limited ultrasound: Right knee:  Mild effusion of the suprapatellar pouch. Normal-appearing quadriceps tendon. Patellar tendon with calcific changes deep near the fat pad as well as a hypoechoic change at the more superior portion of the patellar tendon. Medial joint space narrowing that is moderate as well as outpouching of the meniscus. Normal-appearing lateral joint space  Summary: Degenerative changes appreciated in the medial aspect.  Ultrasound and interpretation by Clare Gandy, MD    ASSESSMENT & PLAN:   OA (osteoarthritis) of knee Acute on chronic in nature.  Has history of surgery when he was 65 years old.  Has degenerative change of the meniscus and joint space that are likely exacerbated with his most recent activity. -Counseled on home exercise therapy and supportive care. -Voltaren. -Could consider injection or physical therapy.

## 2020-10-09 NOTE — Patient Instructions (Signed)
Nice to meet you Please try ice  Please try the exercises  Please try voltaren  Please try riding the stationary bike   Please send me a message in MyChart with any questions or updates.  Please see me back in 4 weeks.   --Dr. Jordan Likes

## 2020-10-09 NOTE — Assessment & Plan Note (Signed)
Acute on chronic in nature.  Has history of surgery when he was 65 years old.  Has degenerative change of the meniscus and joint space that are likely exacerbated with his most recent activity. -Counseled on home exercise therapy and supportive care. -Voltaren. -Could consider injection or physical therapy.

## 2020-10-10 ENCOUNTER — Telehealth: Payer: Self-pay | Admitting: *Deleted

## 2020-10-10 NOTE — Telephone Encounter (Signed)
[employer] called- pt's diclofenac 1% is denied because patient has noted, hives, SOB and  anaphylaxis to ASA and other NSAIDs.

## 2020-10-23 ENCOUNTER — Telehealth: Payer: Self-pay

## 2020-11-05 NOTE — Telephone Encounter (Signed)
error 

## 2020-11-17 ENCOUNTER — Encounter (HOSPITAL_BASED_OUTPATIENT_CLINIC_OR_DEPARTMENT_OTHER): Payer: Self-pay | Admitting: Emergency Medicine

## 2020-11-17 ENCOUNTER — Other Ambulatory Visit: Payer: Self-pay

## 2020-11-17 ENCOUNTER — Emergency Department (HOSPITAL_BASED_OUTPATIENT_CLINIC_OR_DEPARTMENT_OTHER)
Admission: EM | Admit: 2020-11-17 | Discharge: 2020-11-17 | Disposition: A | Discharge status: Home / Self Care | Payer: Medicare Other | Attending: Emergency Medicine | Admitting: Emergency Medicine

## 2020-11-17 ENCOUNTER — Telehealth (HOSPITAL_BASED_OUTPATIENT_CLINIC_OR_DEPARTMENT_OTHER): Payer: Self-pay | Admitting: *Deleted

## 2020-11-17 DIAGNOSIS — I4891 Unspecified atrial fibrillation: Secondary | ICD-10-CM | POA: Diagnosis not present

## 2020-11-17 DIAGNOSIS — Z79899 Other long term (current) drug therapy: Secondary | ICD-10-CM | POA: Insufficient documentation

## 2020-11-17 DIAGNOSIS — M542 Cervicalgia: Secondary | ICD-10-CM | POA: Diagnosis present

## 2020-11-17 DIAGNOSIS — M436 Torticollis: Secondary | ICD-10-CM

## 2020-11-17 DIAGNOSIS — I10 Essential (primary) hypertension: Secondary | ICD-10-CM | POA: Diagnosis not present

## 2020-11-17 MED ORDER — TRAMADOL HCL 50 MG PO TABS: 50.0000 mg | ORAL_TABLET | Freq: Four times a day (QID) | ORAL | 0 refills | Status: DC | PRN

## 2020-11-17 MED ORDER — PREDNISONE 10 MG PO TABS: 40.0000 mg | ORAL_TABLET | Freq: Every day | ORAL | 0 refills | Status: DC

## 2020-11-17 MED ORDER — PREDNISONE 10 MG PO TABS: 40.0000 mg | ORAL_TABLET | Freq: Every day | ORAL | 0 refills | Status: AC

## 2020-11-17 NOTE — Discharge Instructions (Addendum)
Make an appointment to follow-up with sports medicine upstairs.  Take the steroid as directed.  Also take the tramadol as needed for pain.  Make an appointment to follow-up with your primary care doctor cardiology over your concern for the fatigue for the past month.  Return for any chest pain.

## 2020-11-17 NOTE — ED Notes (Signed)
ED Provider at bedside. 

## 2020-11-17 NOTE — Telephone Encounter (Signed)
Pt returned to ED with d/c paperwork stating pharmacy did not have his prescriptions. Spoke with Ethelene Browns at [facility] who states Rx did not come through electronically. Rx info given over phone per epic:  1) Tramadol 50mg . Take 1 tablet (50mg ) by mouth every 6 hours as needed. Dispense 15 tablets. 2) Prednisone 10mg . Take 4 tablets by mouth (40mg ) daily for 5 days. Dispense 20 tablets.  Edward Weaver made aware and informed that pharmacy closes at 6pm this evening.

## 2020-11-17 NOTE — ED Notes (Addendum)
Dr. Deretha Emory at bedside to discuss pharmacy options for pt to pick up Rx today

## 2020-11-17 NOTE — ED Triage Notes (Signed)
Pt reports LT side neck and shoulder pain x 2-3 days; pain increases with movement; also reports feeling fatigued

## 2020-11-17 NOTE — ED Provider Notes (Signed)
[facility] EMERGENCY DEPARTMENT Provider Note   CSN: [id number] Arrival date & time: 11/17/20  1153     History Chief Complaint  Patient presents with   Neck Pain   Shoulder Pain    Edward Weaver is a 65 y.o. male.  Patient with a complaint of left-sided lateral neck pain that kind of moves down towards the shoulder but not in the shoulder itself for the past 3 days.  Patient states he awoke with it.  It is worse with movement.  Significantly moving his head towards the left side.  Can move it to the right side without discomfort.  No pain with range of motion of the left upper extremity at the shoulder elbow or wrist.  No history of fall or injury.  Patient states he has had overall fatigue for about a month.  Patient does have a primary care doctor and is also followed by [facility] cardiology group.  Patient denies any chest pain.      Past Medical History:  Diagnosis Date   Arthritis    Atrial fibrillation (HCC)    Bell's palsy    Carotid artery stenosis, asymptomatic, bilateral    Edema    Hiatal hernia    Hyperlipidemia    Hypertension    Stroke [facility])    TIA (transient ischemic attack)    Nov and/or Dec 2014    Patient Active Problem List   Diagnosis Date Noted   Dyslipidemia 05/01/2019   Neuropathy 03/08/2017   Dizziness 03/08/2017   History of atrial fibrillation 03/08/2017   A-fib (HCC) 01/10/2017   Obesity 01/10/2017   Anxiety 08/02/2013   TIA (transient ischemic attack) 08/02/2013   Visit for preventive health examination 07/24/2013   OA (osteoarthritis) of knee 07/24/2013   Venous insufficiency of right lower extremity 07/24/2013   Pain in joint of right ankle or foot 05/14/2011   Osteochondral defect of talus 05/14/2011    Past Surgical History:  Procedure Laterality Date   ESOPHAGUS SURGERY     KNEE SURGERY Right    1974   TONSILLECTOMY     WISDOM TOOTH EXTRACTION         Family History  Problem Relation Age of Onset   Hypertension  Mother        Living   Cancer - Other Mother        Oral   Heart disease Mother    Hypertension Father    Alzheimer's disease Father        Living   Stroke Brother 21       Deceased   Alzheimer's disease Paternal Aunt    Stroke Paternal Aunt        #1   Dementia Paternal Aunt        #2   Healthy Brother        x1   Healthy Son        x3    Social History   Tobacco Use   Smoking status: Never   Smokeless tobacco: Never  Vaping Use   Vaping Use: Never used  Substance Use Topics   Alcohol use: No   Drug use: No    Home Medications Prior to Admission medications   Medication Sig Start Date End Date Taking? Authorizing Provider  predniSONE (DELTASONE) 10 MG tablet Take 4 tablets (40 mg total) by mouth daily. 11/17/20  Yes Vanetta Mulders, MD  traMADol (ULTRAM) 50 MG tablet Take 1 tablet (50 mg total) by mouth every 6 (  six) hours as needed. 11/17/20  Yes Vanetta Mulders, MD  atorvastatin (LIPITOR) 40 MG tablet Take 1 tablet (40 mg total) by mouth daily at 6 PM. 05/01/19 09/09/20  Barbette Merino, NP  diclofenac Sodium (VOLTAREN) 1 % GEL Apply 4 g topically 4 (four) times daily. To affected joint. 10/09/20   Myra Rude, MD  furosemide (LASIX) 40 MG tablet TAKE 1 TABLET (40 MG TOTAL) BY MOUTH DAILY. 10/12/19   Kallie Locks, FNP  gabapentin (NEURONTIN) 300 MG capsule TAKE 1 CAPSULE (300 MG TOTAL) BY MOUTH 3 (THREE) TIMES DAILY. 01/17/20 01/16/21  Barbette Merino, NP  metoprolol succinate (TOPROL XL) 100 MG 24 hr tablet Take 1 tablet (100 mg total) by mouth every morning. 09/09/20 10/09/20  Tolia, Sunit, DO  rivaroxaban (XARELTO) 20 MG TABS tablet TAKE 1 TABLET (20 MG TOTAL) BY MOUTH DAILY. 01/17/20 01/16/21  Barbette Merino, NP  valACYclovir (VALTREX) 1000 MG tablet Take 1 tablet (1,000 mg total) by mouth daily. 05/05/17   Massie Maroon, FNP    Allergies    Penicillins  Review of Systems   Review of Systems  Constitutional:  Positive for fatigue. Negative for chills and  fever.  HENT:  Negative for ear pain and sore throat.   Eyes:  Negative for pain and visual disturbance.  Respiratory:  Negative for cough and shortness of breath.   Cardiovascular:  Negative for chest pain and palpitations.  Gastrointestinal:  Negative for abdominal pain and vomiting.  Genitourinary:  Negative for dysuria and hematuria.  Musculoskeletal:  Positive for neck pain. Negative for arthralgias and back pain.  Skin:  Negative for color change and rash.  Neurological:  Negative for seizures and syncope.  All other systems reviewed and are negative.  Physical Exam Updated Vital Signs BP 103/68   Pulse (!) 49   Temp 98.1 F (36.7 C) (Oral)   Resp 17   Ht 1.676 m (5\' 6" )   Wt 127 kg   SpO2 99%   BMI 45.19 kg/m   Physical Exam Vitals and nursing note reviewed.  Constitutional:      Appearance: Normal appearance. He is well-developed.  HENT:     Head: Normocephalic and atraumatic.  Eyes:     Extraocular Movements: Extraocular movements intact.     Conjunctiva/sclera: Conjunctivae normal.     Pupils: Pupils are equal, round, and reactive to light.  Neck:     Comments: No tenderness to palpation to the posterior cervical spine or the left lateral part of the cervical spine.  The patient does have increased pain when he moves his head towards the left. Cardiovascular:     Rate and Rhythm: Normal rate and regular rhythm.     Heart sounds: No murmur heard. Pulmonary:     Effort: Pulmonary effort is normal. No respiratory distress.     Breath sounds: Normal breath sounds.  Abdominal:     Palpations: Abdomen is soft.     Tenderness: There is no abdominal tenderness.  Musculoskeletal:        General: No swelling, tenderness or deformity. Normal range of motion.     Cervical back: Neck supple. No tenderness.     Comments: No pain with range of motion at the left shoulder.  Radial pulses 2+.  Sensation intact to fingers.  Good strength in the hand.  Skin:    General: Skin  is warm and dry.     Capillary Refill: Capillary refill takes less than 2 seconds.  Neurological:     General: No focal deficit present.     Mental Status: He is alert and oriented to person, place, and time.    ED Results / Procedures / Treatments   Labs (all labs ordered are listed, but only abnormal results are displayed) Labs Reviewed - No data to display  EKG EKG Interpretation  Date/Time:  "Sunday November 17 2020 12:14:25 EDT Ventricular Rate:  67 PR Interval:    QRS Duration: 98 QT Interval:  388 QTC Calculation: 409 R Axis:   81 Text Interpretation: Atrial fibrillation with a competing junctional pacemaker Abnormal ECG No significant change since last tracing Confirmed by Amamda Curbow ([id number]) on 11/17/2020 12:25:33 PM  Radiology No results found.  Procedures Procedures   Medications Ordered in ED Medications - No data to display  ED Course  I have reviewed the triage vital signs and the nursing notes.  Pertinent labs & imaging results that were available during my care of the patient were reviewed by me and considered in my medical decision making (see chart for details).    MDM Rules/Calculators/A&P                           Patient's symptoms seem to be consistent with a torticollis.  Will treat with a steroid.  And mild pain medicine.  Give patient follow-up with sports medicine upstairs who has been seen by before.  For the patient's fatigue has been ongoing for a month recommend follow-up with primary care doctor and/or his cardiologist.  Last saw cardiology a month ago.  Patient has no chest pain.  EKG has no significant changes compared to the last.  Patient has longstanding atrial fibrillation and is rate controlled.  Patient is on Xarelto for that.  Do not feel that there is an acute cardiac event.  Patient does mention that he was told he had some carotid blockages.  But that should not cause this type of musculoskeletal pain.  Final Clinical  Impression(s) / ED Diagnoses Final diagnoses:  Torticollis, acute    Rx / DC Orders ED Discharge Orders          Ordered    predniSONE (DELTASONE) 10 MG tablet  Daily        11/17/20 1329    traMADol (ULTRAM) 50 MG tablet  Every 6 hours PRN        09" /04/22 1329             06-30-2002, MD 11/17/20 1331

## 2020-11-26 DIAGNOSIS — J40 Bronchitis, not specified as acute or chronic: Secondary | ICD-10-CM | POA: Diagnosis not present

## 2020-12-24 ENCOUNTER — Ambulatory Visit: Payer: Medicare Other | Admitting: Cardiology

## 2020-12-31 ENCOUNTER — Emergency Department (HOSPITAL_BASED_OUTPATIENT_CLINIC_OR_DEPARTMENT_OTHER)
Admission: EM | Admit: 2020-12-31 | Discharge: 2021-01-01 | Disposition: A | Discharge status: Home / Self Care | Payer: Medicare Other | Attending: Emergency Medicine | Admitting: Emergency Medicine

## 2020-12-31 ENCOUNTER — Encounter (HOSPITAL_BASED_OUTPATIENT_CLINIC_OR_DEPARTMENT_OTHER): Payer: Self-pay | Admitting: *Deleted

## 2020-12-31 ENCOUNTER — Emergency Department (HOSPITAL_BASED_OUTPATIENT_CLINIC_OR_DEPARTMENT_OTHER): Payer: Medicare Other

## 2020-12-31 ENCOUNTER — Other Ambulatory Visit: Payer: Self-pay

## 2020-12-31 DIAGNOSIS — Z79899 Other long term (current) drug therapy: Secondary | ICD-10-CM | POA: Diagnosis not present

## 2020-12-31 DIAGNOSIS — R079 Chest pain, unspecified: Secondary | ICD-10-CM | POA: Diagnosis not present

## 2020-12-31 DIAGNOSIS — R0602 Shortness of breath: Secondary | ICD-10-CM | POA: Diagnosis not present

## 2020-12-31 DIAGNOSIS — R0789 Other chest pain: Secondary | ICD-10-CM | POA: Insufficient documentation

## 2020-12-31 DIAGNOSIS — R61 Generalized hyperhidrosis: Secondary | ICD-10-CM | POA: Diagnosis not present

## 2020-12-31 DIAGNOSIS — J9811 Atelectasis: Secondary | ICD-10-CM | POA: Diagnosis not present

## 2020-12-31 DIAGNOSIS — I4891 Unspecified atrial fibrillation: Secondary | ICD-10-CM | POA: Diagnosis not present

## 2020-12-31 DIAGNOSIS — R55 Syncope and collapse: Secondary | ICD-10-CM | POA: Insufficient documentation

## 2020-12-31 DIAGNOSIS — R600 Localized edema: Secondary | ICD-10-CM | POA: Insufficient documentation

## 2020-12-31 DIAGNOSIS — I10 Essential (primary) hypertension: Secondary | ICD-10-CM | POA: Insufficient documentation

## 2020-12-31 LAB — CBC
HCT: 43.6 % (ref 39.0–52.0)
Hemoglobin: 14.4 g/dL (ref 13.0–17.0)
MCH: 29.4 pg (ref 26.0–34.0)
MCHC: 33 g/dL (ref 30.0–36.0)
MCV: 89 fL (ref 80.0–100.0)
Platelets: 305 10*3/uL (ref 150–400)
RBC: 4.9 MIL/uL (ref 4.22–5.81)
RDW: 12.9 % (ref 11.5–15.5)
WBC: 8.5 10*3/uL (ref 4.0–10.5)
nRBC: 0 % (ref 0.0–0.2)

## 2020-12-31 LAB — BASIC METABOLIC PANEL
Anion gap: 8 (ref 5–15)
BUN: 15 mg/dL (ref 8–23)
CO2: 26 mmol/L (ref 22–32)
Calcium: 9 mg/dL (ref 8.9–10.3)
Chloride: 101 mmol/L (ref 98–111)
Creatinine, Ser: 1.14 mg/dL (ref 0.61–1.24)
GFR, Estimated: >60 mL/min (ref >60)
Glucose, Bld: 141 mg/dL — ABNORMAL HIGH (ref 70–99)
Potassium: 3.9 mmol/L (ref 3.5–5.1)
Sodium: 135 mmol/L (ref 135–145)

## 2020-12-31 LAB — HEPATIC FUNCTION PANEL
ALT: 27 U/L (ref 0–44)
AST: 16 U/L (ref 15–41)
Albumin: 3.7 g/dL (ref 3.5–5.0)
Alkaline Phosphatase: 54 U/L (ref 38–126)
Bilirubin, Direct: <0.1 mg/dL (ref 0.0–0.2)
Total Bilirubin: 0.8 mg/dL (ref 0.3–1.2)
Total Protein: 7.1 g/dL (ref 6.5–8.1)

## 2020-12-31 LAB — LIPASE, BLOOD: Lipase: 27 U/L (ref 11–51)

## 2020-12-31 LAB — BRAIN NATRIURETIC PEPTIDE: B Natriuretic Peptide: 112.6 pg/mL — ABNORMAL HIGH (ref 0.0–100.0)

## 2020-12-31 LAB — TROPONIN I (HIGH SENSITIVITY)
Troponin I (High Sensitivity): 6 ng/L (ref <18)
Troponin I (High Sensitivity): 6 ng/L (ref <18)

## 2020-12-31 NOTE — ED Triage Notes (Signed)
Chest pain for an hour. Sob for a week. Pressure. Hx of CHF and atrial fib. EKG at triage.

## 2020-12-31 NOTE — ED Provider Notes (Signed)
[facility] EMERGENCY DEPARTMENT Provider Note   CSN: [id number] Arrival date & time: 12/31/20  1824     History Chief Complaint  Patient presents with   Chest Pain    Edward Weaver is a 65 y.o. male.  The history is provided by the patient and medical records. No language interpreter was used.  Chest Pain Pain location:  L chest Pain quality: aching and pressure   Pain radiates to:  Does not radiate Pain severity:  Moderate Onset quality:  Sudden Duration:  1 hour Timing:  Constant Progression:  Waxing and waning Chronicity:  Recurrent Relieved by:  Rest Worsened by:  Exertion Ineffective treatments:  None tried Associated symptoms: diaphoresis, lower extremity edema (chronic but unchanged by report), near-syncope and shortness of breath   Associated symptoms: no abdominal pain, no altered mental status, no anxiety, no back pain, no claudication, no cough (imoroved), no dizziness, no dysphagia, no fatigue, no fever, no headache, no nausea, no numbness, no palpitations and no vomiting   Risk factors: hypertension and male sex   Risk factors: no prior DVT/PE       Past Medical History:  Diagnosis Date   Arthritis    Atrial fibrillation (HCC)    Bell's palsy    Carotid artery stenosis, asymptomatic, bilateral    Edema    Hiatal hernia    Hyperlipidemia    Hypertension    Stroke [facility])    TIA (transient ischemic attack)    Nov and/or Dec 2014    Patient Active Problem List   Diagnosis Date Noted   Dyslipidemia 05/01/2019   Neuropathy 03/08/2017   Dizziness 03/08/2017   History of atrial fibrillation 03/08/2017   A-fib (HCC) 01/10/2017   Obesity 01/10/2017   Anxiety 08/02/2013   TIA (transient ischemic attack) 08/02/2013   Visit for preventive health examination 07/24/2013   OA (osteoarthritis) of knee 07/24/2013   Venous insufficiency of right lower extremity 07/24/2013   Pain in joint of right ankle or foot 05/14/2011   Osteochondral defect  of talus 05/14/2011    Past Surgical History:  Procedure Laterality Date   ESOPHAGUS SURGERY     KNEE SURGERY Right    1974   TONSILLECTOMY     WISDOM TOOTH EXTRACTION         Family History  Problem Relation Age of Onset   Hypertension Mother        Living   Cancer - Other Mother        Oral   Heart disease Mother    Hypertension Father    Alzheimer's disease Father        Living   Stroke Brother 55       Deceased   Alzheimer's disease Paternal Aunt    Stroke Paternal Aunt        #1   Dementia Paternal Aunt        #2   Healthy Brother        x1   Healthy Son        x3    Social History   Tobacco Use   Smoking status: Never   Smokeless tobacco: Never  Vaping Use   Vaping Use: Never used  Substance Use Topics   Alcohol use: No   Drug use: No    Home Medications Prior to Admission medications   Medication Sig Start Date End Date Taking? Authorizing Provider  atorvastatin (LIPITOR) 40 MG tablet Take 1 tablet (40 mg total) by mouth daily  at 6 PM. 05/01/19 09/09/20  Barbette Merino, NP  diclofenac Sodium (VOLTAREN) 1 % GEL Apply 4 g topically 4 (four) times daily. To affected joint. 10/09/20   Myra Rude, MD  furosemide (LASIX) 40 MG tablet TAKE 1 TABLET (40 MG TOTAL) BY MOUTH DAILY. 10/12/19   Kallie Locks, FNP  gabapentin (NEURONTIN) 300 MG capsule TAKE 1 CAPSULE (300 MG TOTAL) BY MOUTH 3 (THREE) TIMES DAILY. 01/17/20 01/16/21  Barbette Merino, NP  metoprolol succinate (TOPROL XL) 100 MG 24 hr tablet Take 1 tablet (100 mg total) by mouth every morning. 09/09/20 10/09/20  Tolia, Sunit, DO  rivaroxaban (XARELTO) 20 MG TABS tablet TAKE 1 TABLET (20 MG TOTAL) BY MOUTH DAILY. 01/17/20 01/16/21  Barbette Merino, NP  traMADol (ULTRAM) 50 MG tablet Take 1 tablet (50 mg total) by mouth every 6 (six) hours as needed. 11/17/20   Vanetta Mulders, MD  valACYclovir (VALTREX) 1000 MG tablet Take 1 tablet (1,000 mg total) by mouth daily. 05/05/17   Massie Maroon, FNP     Allergies    Penicillins  Review of Systems   Review of Systems  Constitutional:  Positive for diaphoresis. Negative for chills, fatigue and fever.  HENT:  Negative for congestion and trouble swallowing.   Eyes:  Negative for visual disturbance.  Respiratory:  Positive for shortness of breath. Negative for cough (imoroved), chest tightness and wheezing.   Cardiovascular:  Positive for chest pain and near-syncope. Negative for palpitations and claudication.  Gastrointestinal:  Negative for abdominal pain, constipation, diarrhea, nausea and vomiting.  Genitourinary:  Negative for flank pain.  Musculoskeletal:  Negative for back pain.  Neurological:  Negative for dizziness, light-headedness, numbness and headaches.  All other systems reviewed and are negative.  Physical Exam Updated Vital Signs BP 101/79 (BP Location: Right Arm)   Temp (!) 97.4 F (36.3 C) (Oral)   Resp 20   Ht 5\' 6"  (1.676 m)   Wt 127 kg   BMI 45.19 kg/m   Physical Exam Vitals and nursing note reviewed.  Constitutional:      General: He is not in acute distress.    Appearance: He is well-developed. He is obese. He is not ill-appearing, toxic-appearing or diaphoretic.  HENT:     Head: Normocephalic and atraumatic.  Eyes:     Conjunctiva/sclera: Conjunctivae normal.  Cardiovascular:     Rate and Rhythm: Normal rate and regular rhythm.     Heart sounds: Normal heart sounds. No murmur heard. Pulmonary:     Effort: Pulmonary effort is normal. No respiratory distress.     Breath sounds: Examination of the right-lower field reveals rales. Examination of the left-lower field reveals rales. Rales present. No decreased breath sounds, wheezing or rhonchi.  Abdominal:     Palpations: Abdomen is soft.     Tenderness: There is no abdominal tenderness.  Musculoskeletal:     Cervical back: Neck supple.     Right lower leg: Edema present.     Left lower leg: Edema present.  Skin:    General: Skin is warm and dry.      Capillary Refill: Capillary refill takes less than 2 seconds.     Coloration: Skin is not pale.     Findings: No erythema.  Neurological:     Mental Status: He is alert.  Psychiatric:        Mood and Affect: Mood normal.    ED Results / Procedures / Treatments   Labs (all labs ordered are  listed, but only abnormal results are displayed) Labs Reviewed  BASIC METABOLIC PANEL - Abnormal; Notable for the following components:      Result Value   Glucose, Bld 141 (*)    All other components within normal limits  BRAIN NATRIURETIC PEPTIDE - Abnormal; Notable for the following components:   B Natriuretic Peptide 112.6 (*)    All other components within normal limits  CBC  HEPATIC FUNCTION PANEL  LIPASE, BLOOD  TROPONIN I (HIGH SENSITIVITY)  TROPONIN I (HIGH SENSITIVITY)    EKG EKG Interpretation  Date/Time:  Tuesday December 31 2020 18:29:12 EDT Ventricular Rate:  88 PR Interval:    QRS Duration: 104 QT Interval:  378 QTC Calculation: 457 R Axis:   84 Text Interpretation: Atrial fibrillation Abnormal ECG When compared to prior, similar appearance. No STEMI Confirmed by Theda Belfast ([id number]) on 12/31/2020 6:32:10 PM  Radiology DG Chest Port 1 View  Result Date: 12/31/2020 CLINICAL DATA:  Chest pain for 1 hour, shortness of breath for week EXAM: PORTABLE CHEST 1 VIEW COMPARISON:  05/20/2018 FINDINGS: Low lung volumes. Normal cardiac and mediastinal contours when accounting for technique and low lung volumes. No focal pulmonary opacity. Bibasilar atelectasis. No pleural effusion or pneumothorax. No acute osseous abnormality. IMPRESSION: No active disease. Electronically Signed   By: Wiliam Ke M.D.   On: 12/31/2020 19:13    Procedures Procedures   Medications Ordered in ED Medications - No data to display  ED Course  I have reviewed the triage vital signs and the nursing notes.  Pertinent labs & imaging results that were available during my care of the patient were  reviewed by me and considered in my medical decision making (see chart for details).    MDM Rules/Calculators/A&P                           Edward Weaver is a 65 y.o. male with a past medical history significant for chronic atrial fibrillation, prior TIA/stroke, carotid disease, report of CHF, and anxiety who presents with chest pain and shortness of breath.  According to patient, for the last few days he has been having more shortness of breath especially with exertion.  He says he cannot lay flat.  He reports his edema in his legs is chronic and he does not think that is worse but he is having deep breathing difficulties.  He reports that about an hour ago, he started having chest discomfort that is a pressure in his left chest.  While resting after arrival it is feeling better.  He reports it being a 5 out of 10 in severity at its worst.  He says that he might have had some discomfort last night before going to bed but thought it was musculoskeletal.  He denies nausea or diaphoresis and denies any syncope.  He denies any other fevers, chills, ingestion, or cough.  He reports that he had cough several weeks ago that has resolved.  He denies any constipation, diarrhea, or urinary symptoms.  Denies any COVID exposures.  On exam, lungs have some rales in the bases but otherwise no rhonchi or wheezing.  No murmur appreciated.  Chest was nontender on my exam.  Abdomen was nontender.  Legs are edematous but he says they are unchanged from baseline.  Otherwise reassuring exam.  Blood pressures run 100 systolic.  He is on room air on arrival.  EKG shows no STEMI.  Heart score calculated as a 5.  Clinically I do feel need to rule out CHF exacerbation, cardiac cause of discomfort, versus musculoskeletal pain.  He is on blood thinners and reports he takes it well.  He is not having any pleuritic discomfort and is not tachycardic on arrival, doubt thromboembolic cause of symptoms.  We will ambulate with pulse  oximetry and reassess after labs and imaging.  Anticipate reassessment after work-up to determine disposition.     Delta troponin negative.  BNP similar to prior.  Chest x-ray shows no pneumonia or pneumothorax.  Other work-up similar to prior and reassuring.  Had a shared decision-making conversation with patient and family.  Due to his heart score of 5, we did offer admission for high risk chest pain however he suspects is musculoskeletal and given his description and reassuring work-up thus far I agree at this time.  Patient would rather follow-up with his cardiologist this week and if any symptoms change or worsen return.  Patient and family had no other questions or concerns and patient discharged in good condition after reassuring work-up.   Final Clinical Impression(s) / ED Diagnoses Final diagnoses:  Atypical chest pain    Rx / DC Orders ED Discharge Orders     None      Clinical Impression: 1. Atypical chest pain   2. Chest pain     Disposition: Discharge  Condition: Good  I have discussed the results, Dx and Tx plan with the pt(& family if present). He/she/they expressed understanding and agree(s) with the plan. Discharge instructions discussed at great length. Strict return precautions discussed and pt &/or family have verbalized understanding of the instructions. No further questions at time of discharge.    Discharge Medication List as of 12/31/2020 11:45 PM      Follow Up: Aida Puffer, MD [address] [place] [address] [phone]     [facility] CARDIOVASCULAR DIVISION [address] [place] [address] [phone]    [facility] [place] [facility] [address] [id number] OF [address] [place] [address] [phone]       Shanora Christensen, Canary Brim, MD 01/01/21 [phone]

## 2020-12-31 NOTE — ED Notes (Signed)
Pt ambulated well, no c/o sob; pt O2 sats stayed at 95% while walking and HR 101; EDP made aware

## 2020-12-31 NOTE — Discharge Instructions (Signed)
Your history, exam, work-up today did not reveal a clear cardiac cause of your symptoms.  Clinically I suspect is more musculoskeletal given the suspicion you had earlier.  Your heart enzymes were negative both times we checked them and the other work-up was also reassuring.  As we discussed, your risk for a cardiac cause is elevated based on your past medical history and other medical problems however after our shared decision-making conversation, we agree with letting you be discharged to follow-up with outpatient cardiology with good return precautions.  If any symptoms change or worsen acutely, please return to the nearest emergency department.  Please rest.

## 2021-01-01 ENCOUNTER — Telehealth: Payer: Self-pay

## 2021-01-01 NOTE — Telephone Encounter (Signed)
Please arrange a follow up visit s/p ER visit for chest pain.

## 2021-01-01 NOTE — Telephone Encounter (Signed)
No answer left a vm to call back

## 2021-01-03 NOTE — Telephone Encounter (Signed)
Patient has been scheduled

## 2021-01-13 ENCOUNTER — Ambulatory Visit: Payer: Medicare Other | Admitting: Cardiology

## 2021-01-14 ENCOUNTER — Encounter: Payer: Self-pay | Admitting: Cardiology

## 2021-01-14 ENCOUNTER — Ambulatory Visit: Payer: Medicare Other | Admitting: Cardiology

## 2021-01-14 ENCOUNTER — Other Ambulatory Visit: Payer: Self-pay

## 2021-01-14 VITALS — BP 131/81 | HR 84 | Temp 98.5°F | Resp 16 | Ht 66.0 in | Wt 270.9 lb

## 2021-01-14 DIAGNOSIS — I4819 Other persistent atrial fibrillation: Secondary | ICD-10-CM

## 2021-01-14 DIAGNOSIS — R0609 Other forms of dyspnea: Secondary | ICD-10-CM | POA: Diagnosis not present

## 2021-01-14 DIAGNOSIS — Z7901 Long term (current) use of anticoagulants: Secondary | ICD-10-CM

## 2021-01-14 DIAGNOSIS — I10 Essential (primary) hypertension: Secondary | ICD-10-CM

## 2021-01-14 DIAGNOSIS — G4733 Obstructive sleep apnea (adult) (pediatric): Secondary | ICD-10-CM

## 2021-01-14 DIAGNOSIS — E7800 Pure hypercholesterolemia, unspecified: Secondary | ICD-10-CM

## 2021-01-14 DIAGNOSIS — I5022 Chronic systolic (congestive) heart failure: Secondary | ICD-10-CM

## 2021-01-14 DIAGNOSIS — I6523 Occlusion and stenosis of bilateral carotid arteries: Secondary | ICD-10-CM

## 2021-01-14 MED ORDER — ASPIRIN EC 81 MG PO TBEC: 81.0000 mg | DELAYED_RELEASE_TABLET | Freq: Every day | ORAL | 11 refills | Status: DC

## 2021-01-14 NOTE — Progress Notes (Signed)
Date:  01/14/2021   ID:  Edward Weaver, DOB 1956-01-08, MRN [id number]  PCP:  Tamsen Roers, MD  Cardiologist:  Rex Kras, DO, [facility] (established care 08/13/2020)  Date: 01/14/21 Last Office Visit: 09/09/2020  Chief Complaint  Patient presents with   Hospitalization Follow-up   Shortness of Breath    HPI  Edward Weaver is a 64 y.o. male who presents to the office with a chief complaint of " heart failure management and recent hospitalization." Patient's past medical history and cardiovascular risk factors include: Chronic HFrEF, OSA not CPAP, Atrial Fibrillation, TIA, Hypertension, Hyperlipidemia, bilateral carotid artery stenosis, obesity due to excess calories.  He is referred to the office at the request of Tamsen Roers, MD for evaluation of atrial fibrillation.  Patient is accompanied by his wife Edward Weaver at today's office visit.  Initially referred to the office for atrial fibrillation management.  Initially diagnosed in 2018 per patient and has been on AV nodal blocking agents for rate control strategy and oral anticoagulation for thromboembolic prophylaxis.  Patient is not sure if he is undergone direct-current cardioversion in the past.  He denies prior use of antiarrhythmic medications.  He does not endorse any evidence of bleeding or prior history of intracranial bleeding or gastrointestinal bleeding.  In the past he has been evaluated for precordial pain by undergoing an echocardiogram and stress test.  Results reviewed as part of today's visit to help facilitate medical decision making.  He was noted to have mildly reduced LVEF and was started on Entresto with goals of further medication titration for symptom management.  However, patient follow has been limited for reasons unknown.  He denies any shortness of breath at rest and his effort related dyspnea is relatively stable.  Lower extremity swelling is still present but improving and denies orthopnea or paroxysmal nocturnal  dyspnea.  No recent hospitalizations for congestive heart failure.  Prior carotid duplex  noted bilateral carotid artery stenosis which is being followed longitudinally.  Patient thinks that he is on atorvastatin with his wife he denies this.  He has a repeat carotid duplex in December 2022.  Recent hospitalization in October 2022 - Labs and EKG reviewed.  FUNCTIONAL STATUS: No structured exercise program or daily routine.   ALLERGIES: Allergies  Allergen Reactions   Penicillins Other (See Comments)    Syncope, hypotension    MEDICATION LIST PRIOR TO VISIT: Current Meds  Medication Sig   aspirin EC 81 MG tablet Take 1 tablet (81 mg total) by mouth daily. Swallow whole.   atorvastatin (LIPITOR) 40 MG tablet Take 1 tablet (40 mg total) by mouth daily at 6 PM.   diclofenac Sodium (VOLTAREN) 1 % GEL Apply 4 g topically 4 (four) times daily. To affected joint.   furosemide (LASIX) 40 MG tablet TAKE 1 TABLET (40 MG TOTAL) BY MOUTH DAILY.   gabapentin (NEURONTIN) 300 MG capsule TAKE 1 CAPSULE (300 MG TOTAL) BY MOUTH 3 (THREE) TIMES DAILY.   metoprolol succinate (TOPROL XL) 100 MG 24 hr tablet Take 1 tablet (100 mg total) by mouth every morning.   rivaroxaban (XARELTO) 20 MG TABS tablet TAKE 1 TABLET (20 MG TOTAL) BY MOUTH DAILY.     PAST MEDICAL HISTORY: Past Medical History:  Diagnosis Date   Arthritis    Atrial fibrillation (HCC)    Bell's palsy    Carotid artery stenosis, asymptomatic, bilateral    Edema    Hiatal hernia    Hyperlipidemia    Hypertension  Stroke [facility])    TIA (transient ischemic attack)    Nov and/or Dec 2014    PAST SURGICAL HISTORY: Past Surgical History:  Procedure Laterality Date   ESOPHAGUS SURGERY     KNEE SURGERY Right    1974   TONSILLECTOMY     WISDOM TOOTH EXTRACTION      FAMILY HISTORY: The patient family history includes Alzheimer's disease in his father and paternal aunt; Cancer - Other in his mother; Dementia in his paternal aunt;  Healthy in his brother and son; Heart disease in his mother; Hypertension in his father and mother; Stroke in his paternal aunt; Stroke (age of onset: 63) in his brother.  SOCIAL HISTORY:  The patient  reports that he has never smoked. He has never used smokeless tobacco. He reports that he does not drink alcohol and does not use drugs.  REVIEW OF SYSTEMS: Review of Systems  Constitutional: Negative for chills and fever.  HENT:  Negative for hoarse voice and nosebleeds.   Eyes:  Negative for discharge, double vision and pain.  Cardiovascular:  Positive for dyspnea on exertion (chronic and stable.). Negative for chest pain, claudication, leg swelling, near-syncope, orthopnea, palpitations, paroxysmal nocturnal dyspnea and syncope.  Respiratory:  Positive for shortness of breath and snoring. Negative for hemoptysis.   Musculoskeletal:  Negative for muscle cramps and myalgias.  Gastrointestinal:  Negative for abdominal pain, constipation, diarrhea, hematemesis, hematochezia, melena, nausea and vomiting.  Neurological:  Negative for dizziness and light-headedness.   PHYSICAL EXAM: Vitals with BMI 01/14/2021 12/31/2020 12/31/2020  Height _0  - -  Weight 270 lbs 14 oz - -  BMI 70.01 - -  Systolic 749 449 675  Diastolic 81 82 82  Pulse 84 61 75    CONSTITUTIONAL: Appears older than stated age, hemodynamically stable, no acute distress.    SKIN: Skin is warm and dry. No rash noted. No cyanosis. No pallor. No jaundice HEAD: Normocephalic and atraumatic.  EYES: No scleral icterus MOUTH/THROAT: Moist oral membranes.  NECK: Unable to evaluate JVP due to a short neck stature, no JVD present. No thyromegaly noted. No carotid bruits  LYMPHATIC: No visible cervical adenopathy.  CHEST Normal respiratory effort. No intercostal retractions  LUNGS: Clear to auscultation bilaterally with decreased breath sounds at the bases.  No stridor. No wheezes. No rales.  CARDIOVASCULAR: Irregularly irregular,  variable S1-S2, no murmurs rubs or gallops appreciated. ABDOMINAL: Obese, soft, nontender, nondistended, positive bowel sounds in all 4 quadrants, no abdominal bruits appreciated.  No apparent ascites.  EXTREMITIES: +2 pitting edema bilaterally, findings of chronic venous insufficiency, +2 dorsalis pedis and posterior tibial pulses, poor nail hygiene.   HEMATOLOGIC: No significant bruising NEUROLOGIC: Oriented to person, place, and time. Nonfocal. Normal muscle tone.  PSYCHIATRIC: Normal mood and affect. Normal behavior. Cooperative  CARDIAC DATABASE: EKG: 01/15/2019: Atrial fibrillation, 86 bpm.  Echocardiogram: 08/22/2020: Study Quality: Technically difficult study. Mildly depressed LV systolic function with visual EF 45-50%. Left ventricle cavity is normal in size. Moderate left ventricular hypertrophy. Normal global wall motion. Unable to evaluate diastolic function due to atrial fibrillation. Elevated LAP. Left atrial cavity is moderately dilated. Mild (Grade I) mitral regurgitation. Mild tricuspid regurgitation. IVC is normal with a respiratory response of <50%. Prior study dated 01/11/2017: LVEF 50-55%, mild LVH, no RWMA, mild LAE, mildRAE, mildly dilated RV.  Stress Testing: Lexiscan Tetrofosmin stress test 08/28/2020: Stress EKG is non-diagnostic, as this is pharmacological stress test using Lexiscan. Stress EKG is non-diagnostic, as this is pharmacological stress test. Rest  and stress EKG showed atrial fibrillation, no ST-T abnormality. SPECT images show normal stress myocardial perfusion. Decreased tracer uptake in inferior myocardium at rest likely due diaphragmatic attenuation. Stress LVEF calculated 48%, but visually appears normal. Low risk study.  Heart Catheterization: None   Carotid Duplex: Carotid artery duplex 08/22/2020: Stenosis in the right internal carotid artery (50-69%). Stenosis in the right external carotid artery (<50%). Stenosis in the left internal  carotid artery (50-69%). Stenosis in the left external carotid artery (<50%). There is mostly homogeneous plaque in bilateral carotid arteries. Antegrade right vertebral artery flow. Antegrade left vertebral artery flow. Compared to the study done on 01/11/2017, there is mild progression of disease in the right ICA.  Follow up in six months is appropriate if clinicallyindicated.  LABORATORY DATA: CBC Latest Ref Rng & Units 12/31/2020 04/24/2019 10/15/2017  WBC 4.0 - 10.5 K/uL 8.5 6.2 6.0  Hemoglobin 13.0 - 17.0 g/dL 14.4 14.9 14.8  Hematocrit 39.0 - 52.0 % 43.6 44.4 45.1  Platelets 150 - 400 K/uL 305 317 302    CMP Latest Ref Rng & Units 12/31/2020 09/10/2020 04/24/2019  Glucose 70 - 99 mg/dL 141(H) 103(H) 109(H)  BUN 8 - 23 mg/dL _0 Creatinine 0.61 - 1.24 mg/dL 1.14 1.08 1.02  Sodium 135 - 145 mmol/L 135 140 138  Potassium 3.5 - 5.1 mmol/L 3.9 4.9 4.6  Chloride 98 - 111 mmol/L 101 103 103  CO2 22 - 32 mmol/L _1 Calcium 8.9 - 10.3 mg/dL 9.0 9.0 9.4  Total Protein 6.5 - 8.1 g/dL 7.1 6.5 7.1  Total Bilirubin 0.3 - 1.2 mg/dL 0.8 0.5 0.5  Alkaline Phos 38 - 126 U/L 54 70 82  AST 15 - 41 U/L _2 ALT 0 - 44 U/L 27 35 26    Lipid Panel  Lab Results  Component Value Date   CHOL 225 (H) 09/10/2020   HDL 46 09/10/2020   LDLCALC 141 (H) 09/10/2020   LDLDIRECT 149 (H) 09/10/2020   TRIG 211 (H) 09/10/2020   CHOLHDL 5.5 01/11/2017    No components found for: NTPROBNP Recent Labs    09/10/20 1255  PROBNP 980*   No results for input(s): TSH in the last 8760 hours.  BMP Recent Labs    09/10/20 1255 12/31/20 1850  NA 140 135  K 4.9 3.9  CL 103 101  CO2 26 26  GLUCOSE 103* 141*  BUN 11 15  CREATININE 1.08 1.14  CALCIUM 9.0 9.0  GFRNONAA  --  >60    HEMOGLOBIN A1C Lab Results  Component Value Date   HGBA1C 5.9 (A) 05/19/2018   MPG 125.5 01/10/2017   External Labs: Collected: 08/06/2020 provided by PCP Creatinine 1 mg/dL. eGFR: 79 mL/min per 1.73  m Lipid profile: Total cholesterol 218, triglycerides 269, HDL 45, LDL 132, non HDL 173 Hemoglobin A1c: 6 TSH: 2.06  IMPRESSION:    ICD-10-CM   1. Chronic HFrEF (heart failure with reduced ejection fraction) (HCC)  I50.22 EKG 12-Lead    2. Persistent atrial fibrillation (HCC)  I48.19     3. Long term (current) use of anticoagulants  Z79.01     4. Bilateral carotid artery stenosis  I65.23 aspirin EC 81 MG tablet    5. Hypercholesterolemia  E78.00     6. OSA (obstructive sleep apnea)  G47.33 Ambulatory referral to Sleep Studies    7. Benign hypertension  I10     8. Class 3 severe obesity due to excess calories  with serious comorbidity and body mass index (BMI) of 40.0 to 44.9 in adult [facility])  E66.01    Z68.41        RECOMMENDATIONS: Edward Weaver is a 65 y.o. male whose past medical history and cardiac risk factors include: Prediabetes, OSA not CPAP, Atrial Fibrillation, TIA, Hypertension, Hyperlipidemia, bilateral carotid artery stenosis, obesity due to excess calories.  Chronic heart failure with reduced EF, stage B, NYHA class II: Last echocardiogram noted LVEF 45-50% Last nuclear stress test: Low risk study No recent hospitalizations for congestive heart failure. Patient started on Entresto back in July 2022 and thereafter had no office follow-up with multiple no-shows. Today he is not aware of exactly which medication he is currently on and therefore medication titration is a difficult.  I have asked the patient and his wife to call the office to reconcile the medications so that additional medications can be initiated if clinically warranted or changed.  They are agreeable with the plan of care. Patient recently had an ER visit in October 2022 ED records, labs and EKG reviewed.  Atrial fibrillation Persistent, diagnosed in 2018. Rate control: Toprol-XL. Rhythm control: N/A. Thromboembolic prophylaxis: Xarelto States that he may have been cardioverted once in the past  but does not recall if it restored normal sinus rhythm. No prior history of antiarrhythmic medications or atrial fibrillation ablation. He has been evaluated in A. fib clinic back in 2020 but lost to follow-up due to lack of insurance. Since patient has illustrated multiple no-shows and poor follow-up he may not be safe to start antiarrhythmic medications right now.  We will reconsider if his compliance improves.  Long-term oral anticoagulation: Indication: Persistent atrial fibrillation. Has remained on Xarelto since 2018 and does not endorse any evidence of bleeding.   No history of intracranial bleeding or gastrointestinal bleeding. Risks, benefits, and alternatives to oral anticoagulation discussed.  Patient verbalizes understanding. Most recent hemoglobin 14.4 g/dL (October 2021). Most recent serum creatinine clearance greater than 50 mL/min.  Bilateral carotid artery stenosis: Noted to have bilateral carotid artery stenosis around 50-69% stenosis. Currently asymptomatic.   Add ASA 38m po qday.  States that he is on statins; however, wife denies.   Hyperlipidemia:  Based on his LDL, HFrEF, carotid artery stenosis recommended statin therapy.   Patient is not sure if he is on statin therapy.  He will call the office and reconcile his medications.  Patient is aware that he should be on statin therapy given his bilateral carotid artery stenosis.    Sleep apnea: Hx of sleep apnea but chooses not to on CPAP. However, given his symptoms as discussed above and multiple cardiovascular conditions patient is encouraged and motivated to be more compliant with CPAP if it is clinically indicated.   Patient requesting a referral to sleep medicine. Will refer the patient to Dr. OElenore Rotafor further evaluation and management of sleep apnea.  FINAL MEDICATION LIST END OF ENCOUNTER: Meds ordered this encounter  Medications   aspirin EC 81 MG tablet    Sig: Take 1 tablet (81 mg total) by mouth  daily. Swallow whole.    Dispense:  30 tablet    Refill:  11     Medications Discontinued During This Encounter  Medication Reason   traMADol (ULTRAM) 50 MG tablet Error   valACYclovir (VALTREX) 1000 MG tablet Error     Current Outpatient Medications:    aspirin EC 81 MG tablet, Take 1 tablet (81 mg total) by mouth daily. Swallow whole., Disp: 30  tablet, Rfl: 11   atorvastatin (LIPITOR) 40 MG tablet, Take 1 tablet (40 mg total) by mouth daily at 6 PM., Disp: 90 tablet, Rfl: 0   diclofenac Sodium (VOLTAREN) 1 % GEL, Apply 4 g topically 4 (four) times daily. To affected joint., Disp: 100 g, Rfl: 11   furosemide (LASIX) 40 MG tablet, TAKE 1 TABLET (40 MG TOTAL) BY MOUTH DAILY., Disp: 30 tablet, Rfl: 3   gabapentin (NEURONTIN) 300 MG capsule, TAKE 1 CAPSULE (300 MG TOTAL) BY MOUTH 3 (THREE) TIMES DAILY., Disp: 90 capsule, Rfl: 3   metoprolol succinate (TOPROL XL) 100 MG 24 hr tablet, Take 1 tablet (100 mg total) by mouth every morning., Disp: 30 tablet, Rfl: 0   rivaroxaban (XARELTO) 20 MG TABS tablet, TAKE 1 TABLET (20 MG TOTAL) BY MOUTH DAILY., Disp: 90 tablet, Rfl: 3  Orders Placed This Encounter  Procedures   Ambulatory referral to Sleep Studies   EKG 12-Lead    There are no Patient Instructions on file for this visit.   --Continue cardiac medications as reconciled in final medication list. --Return in about 8 weeks (around 03/11/2021) for Follow up, heart failure management, carotid disease. Or sooner if needed. --Continue follow-up with your primary care physician regarding the management of your other chronic comorbid conditions.  Patient's questions and concerns were addressed to his satisfaction. He voices understanding of the instructions provided during this encounter.   This note was created using a voice recognition software as a result there may be grammatical errors inadvertently enclosed that do not reflect the nature of this encounter. Every attempt is made to correct  such errors.  Total time spent: 45 minutes.  As part of this visit reviewed ED records from 12/31/2020 which included note, EKG, labs these findings have been summarized and noted above for further reference.  Discussed disease management, ordering diagnostic testing, coordination of care and patient education provided as a part of today's encounter.   Rex Kras, [place], [facility]  Pager: [phone] Office: [phone]

## 2021-01-17 ENCOUNTER — Other Ambulatory Visit: Payer: Self-pay

## 2021-01-17 ENCOUNTER — Other Ambulatory Visit: Payer: Self-pay | Admitting: Cardiology

## 2021-01-17 DIAGNOSIS — I5022 Chronic systolic (congestive) heart failure: Secondary | ICD-10-CM

## 2021-01-17 DIAGNOSIS — E7800 Pure hypercholesterolemia, unspecified: Secondary | ICD-10-CM

## 2021-01-17 MED ORDER — ATORVASTATIN CALCIUM 40 MG PO TABS: 40.0000 mg | ORAL_TABLET | Freq: Every day | ORAL | 0 refills | Status: DC

## 2021-01-17 MED ORDER — ENTRESTO 97-103 MG PO TABS: 1.0000 | ORAL_TABLET | Freq: Two times a day (BID) | ORAL | 0 refills | Status: DC

## 2021-01-17 NOTE — Progress Notes (Signed)
Patient called back to reconcile his medications.  Patient states that he is not on Lipitor.  Given his carotid artery stenosis we will represcribed Lipitor 40 mg p.o. nightly.  Given his CHF will uptitrate Entresto to 97/103 mg p.o. twice daily.  We will do blood work in 1 week to evaluate kidney function and electrolytes.  Tessa Lerner, [place], [facility]  Pager: [phone] Office: [phone]

## 2021-01-27 ENCOUNTER — Ambulatory Visit: Payer: Medicare Other | Admitting: Cardiology

## 2021-01-31 ENCOUNTER — Other Ambulatory Visit: Payer: Self-pay | Admitting: Cardiology

## 2021-01-31 ENCOUNTER — Other Ambulatory Visit: Payer: Self-pay | Admitting: Nurse Practitioner

## 2021-01-31 DIAGNOSIS — Z8679 Personal history of other diseases of the circulatory system: Secondary | ICD-10-CM

## 2021-02-26 DIAGNOSIS — J40 Bronchitis, not specified as acute or chronic: Secondary | ICD-10-CM | POA: Diagnosis not present

## 2021-02-26 DIAGNOSIS — G629 Polyneuropathy, unspecified: Secondary | ICD-10-CM | POA: Diagnosis not present

## 2021-03-05 ENCOUNTER — Other Ambulatory Visit: Payer: Self-pay | Admitting: Cardiology

## 2021-03-05 DIAGNOSIS — Z8679 Personal history of other diseases of the circulatory system: Secondary | ICD-10-CM

## 2021-03-11 ENCOUNTER — Ambulatory Visit: Payer: Medicare Other | Admitting: Cardiology

## 2021-04-07 ENCOUNTER — Ambulatory Visit: Payer: Medicare Other | Admitting: Family Medicine

## 2021-04-18 ENCOUNTER — Ambulatory Visit: Payer: Medicare Other | Admitting: Cardiology

## 2021-04-26 ENCOUNTER — Emergency Department (HOSPITAL_BASED_OUTPATIENT_CLINIC_OR_DEPARTMENT_OTHER)
Admission: EM | Admit: 2021-04-26 | Discharge: 2021-04-26 | Disposition: A | Discharge status: Home / Self Care | Payer: Medicare Other | Attending: Emergency Medicine | Admitting: Emergency Medicine

## 2021-04-26 ENCOUNTER — Encounter (HOSPITAL_BASED_OUTPATIENT_CLINIC_OR_DEPARTMENT_OTHER): Payer: Self-pay | Admitting: Emergency Medicine

## 2021-04-26 ENCOUNTER — Other Ambulatory Visit: Payer: Self-pay

## 2021-04-26 DIAGNOSIS — Z7982 Long term (current) use of aspirin: Secondary | ICD-10-CM | POA: Insufficient documentation

## 2021-04-26 DIAGNOSIS — Z79899 Other long term (current) drug therapy: Secondary | ICD-10-CM | POA: Diagnosis not present

## 2021-04-26 DIAGNOSIS — U071 COVID-19: Secondary | ICD-10-CM | POA: Diagnosis not present

## 2021-04-26 DIAGNOSIS — I4891 Unspecified atrial fibrillation: Secondary | ICD-10-CM | POA: Insufficient documentation

## 2021-04-26 DIAGNOSIS — Z7901 Long term (current) use of anticoagulants: Secondary | ICD-10-CM | POA: Insufficient documentation

## 2021-04-26 DIAGNOSIS — I10 Essential (primary) hypertension: Secondary | ICD-10-CM | POA: Diagnosis not present

## 2021-04-26 DIAGNOSIS — R059 Cough, unspecified: Secondary | ICD-10-CM | POA: Diagnosis present

## 2021-04-26 LAB — RESP PANEL BY RT-PCR (FLU A&B, COVID) ARPGX2
Influenza A by PCR: NEGATIVE
Influenza B by PCR: NEGATIVE
SARS Coronavirus 2 by RT PCR: POSITIVE — AB

## 2021-04-26 MED ORDER — MOLNUPIRAVIR EUA 200MG CAPSULE: 4.0000 | ORAL_CAPSULE | Freq: Two times a day (BID) | ORAL | 0 refills | Status: AC

## 2021-04-26 NOTE — ED Provider Notes (Signed)
[facility] EMERGENCY DEPARTMENT Provider Note   CSN: [id number] Arrival date & time: 04/26/21  1418     History  Chief Complaint  Patient presents with   Cough    Edward Weaver is a 66 y.o. male.  66 year old male with history of bronchitis presents with complaint of cough and congestion x 4 days, reports chill last night, no fevers. Occasional wheeze at times, using inhaler at home, has not needed today. Exposed to his wife who has similar symptoms. Non smoker, no other complaints or concerns.  Also history of a fib, on xarelto, HTN.       Home Medications Prior to Admission medications   Medication Sig Start Date End Date Taking? Authorizing Provider  molnupiravir EUA (LAGEVRIO) 200 mg CAPS capsule Take 4 capsules (800 mg total) by mouth 2 (two) times daily for 5 days. 04/26/21 05/01/21 Yes Tacy Learn, PA-C  aspirin EC 81 MG tablet Take 1 tablet (81 mg total) by mouth daily. Swallow whole. 01/14/21   Tolia, Sunit, DO  atorvastatin (LIPITOR) 40 MG tablet Take 1 tablet (40 mg total) by mouth at bedtime. 01/17/21 04/17/21  Tolia, Sunit, DO  furosemide (LASIX) 40 MG tablet TAKE 1 TABLET (40 MG TOTAL) BY MOUTH DAILY. 10/12/19   Azzie Glatter, FNP  gabapentin (NEURONTIN) 300 MG capsule TAKE 1 CAPSULE (300 MG TOTAL) BY MOUTH 3 (THREE) TIMES DAILY. Patient taking differently: Take by mouth 2 (two) times daily. 01/17/20 01/16/21  Vevelyn Francois, NP  metoprolol succinate (TOPROL XL) 100 MG 24 hr tablet Take 1 tablet (100 mg total) by mouth every morning. 09/09/20 01/15/21  Tolia, Sunit, DO  XARELTO 20 MG TABS tablet TAKE 1 TABLET BY MOUTH DAILY 03/05/21   Tolia, Sunit, DO      Allergies    Penicillins    Review of Systems   Review of Systems Negative except as per HPI Physical Exam Updated Vital Signs BP (!) 136/96 (BP Location: Left Arm)    Pulse 66    Temp 97.8 F (36.6 C) (Oral)    Resp 20    Ht 5\' 6"  (1.676 m)    Wt 127 kg    SpO2 98%    BMI 45.19 kg/m  Physical  Exam Vitals and nursing note reviewed.  Constitutional:      General: He is not in acute distress.    Appearance: He is well-developed. He is not diaphoretic.  HENT:     Head: Normocephalic and atraumatic.     Mouth/Throat:     Mouth: Mucous membranes are moist.     Pharynx: No oropharyngeal exudate or posterior oropharyngeal erythema.  Eyes:     Conjunctiva/sclera: Conjunctivae normal.  Cardiovascular:     Rate and Rhythm: Normal rate and regular rhythm.     Heart sounds: Normal heart sounds.  Pulmonary:     Effort: Pulmonary effort is normal.     Breath sounds: Normal breath sounds.  Musculoskeletal:     Cervical back: Neck supple.  Lymphadenopathy:     Cervical: No cervical adenopathy.  Skin:    General: Skin is warm and dry.     Findings: No erythema or rash.  Neurological:     Mental Status: He is alert and oriented to person, place, and time.  Psychiatric:        Behavior: Behavior normal.    ED Results / Procedures / Treatments   Labs (all labs ordered are listed, but only abnormal results are  displayed) Labs Reviewed  RESP PANEL BY RT-PCR (FLU A&B, COVID) ARPGX2 - Abnormal; Notable for the following components:      Result Value   SARS Coronavirus 2 by RT PCR POSITIVE (*)    All other components within normal limits    EKG None  Radiology No results found.  Procedures Procedures    Medications Ordered in ED Medications - No data to display  ED Course/ Medical Decision Making/ A&P Clinical Course as of 04/26/21 1629  Sat Apr 26, 8770  5438 66 year old male with history of afib, on Xarelto, bronchitis (uses albuterol inhaler, non smoker), HTN presents with cough and congestion x 4 days. On exam, well appearing, faint exp wheeze in upper fields otherwise lungs CTA. Vitals reviewed, O2 sat 98% on room air, afebrile. COVID +, due to risk factors (hx lung disease and age over 55), will treat with antiviral, unable to use Paxlovid due to patient taking  Xarelto. Recommend Coricidin HBP for symptom relief.  Recheck with PCP as needed, return to ED for worsening or concerning symptoms.  Patient verbalizes understanding of discharge instructions and plan [LM]    Clinical Course User Index [LM] Tacy Learn, PA-C                           Medical Decision Making          Final Clinical Impression(s) / ED Diagnoses Final diagnoses:  COVID    Rx / DC Orders ED Discharge Orders          Ordered    molnupiravir EUA (LAGEVRIO) 200 mg CAPS capsule  2 times daily        04/26/21 1604              Tacy Learn, PA-C 04/26/21 1629    Blanchie Dessert, MD 04/26/21 1739

## 2021-04-26 NOTE — ED Triage Notes (Signed)
Pt c/o cough since Tuesday.  

## 2021-04-26 NOTE — ED Notes (Signed)
Pt talking in full sentences, pt states that he is ready to go home, verbalized understanding d/c and follow up. Pt from dpt

## 2021-04-26 NOTE — Discharge Instructions (Signed)
Try Coricidin HBP for your symptoms. Take Molnupiravir as prescribed. Recheck with your doctor as needed, return to the ER for worsening or concerning symptoms.

## 2021-04-26 NOTE — ED Notes (Signed)
Pt sitting up in chair, pt has cough, pt talking in full sentences, pt has faint expiratory wheeze.

## 2021-06-10 ENCOUNTER — Other Ambulatory Visit: Payer: Self-pay | Admitting: Cardiology

## 2021-06-10 DIAGNOSIS — Z8679 Personal history of other diseases of the circulatory system: Secondary | ICD-10-CM

## 2021-06-30 ENCOUNTER — Other Ambulatory Visit: Payer: Self-pay | Admitting: Cardiology

## 2021-06-30 DIAGNOSIS — I5022 Chronic systolic (congestive) heart failure: Secondary | ICD-10-CM

## 2021-07-09 ENCOUNTER — Telehealth: Payer: Self-pay | Admitting: Cardiology

## 2021-07-09 NOTE — Telephone Encounter (Signed)
Patient states he has been blacking out, weak and pale. He hasn't been seen since 01/14/21. The next available slot for 30 minutes was 07/22/21, but please let me know if I need to move his appointment up and if a 15 minute slot is ok.  ?

## 2021-07-10 NOTE — Telephone Encounter (Signed)
Please ask what his home BP has been running? ?Any signs of bleeding? ?Does Celeste have any sooner appt and I can see him w/ her? ? ?Dr. Odis Hollingshead

## 2021-07-11 ENCOUNTER — Ambulatory Visit: Payer: Medicare Other

## 2021-07-11 DIAGNOSIS — I6523 Occlusion and stenosis of bilateral carotid arteries: Secondary | ICD-10-CM | POA: Diagnosis not present

## 2021-07-14 ENCOUNTER — Other Ambulatory Visit: Payer: Self-pay | Admitting: Cardiology

## 2021-07-14 DIAGNOSIS — Z8679 Personal history of other diseases of the circulatory system: Secondary | ICD-10-CM

## 2021-07-16 ENCOUNTER — Ambulatory Visit: Payer: Medicare Other | Admitting: Student

## 2021-07-16 ENCOUNTER — Encounter: Payer: Self-pay | Admitting: Student

## 2021-07-16 ENCOUNTER — Other Ambulatory Visit: Payer: Self-pay

## 2021-07-16 VITALS — BP 127/77 | HR 56 | Temp 98.0°F | Resp 17 | Ht 66.0 in | Wt 278.6 lb

## 2021-07-16 DIAGNOSIS — I5022 Chronic systolic (congestive) heart failure: Secondary | ICD-10-CM

## 2021-07-16 DIAGNOSIS — I4819 Other persistent atrial fibrillation: Secondary | ICD-10-CM | POA: Diagnosis not present

## 2021-07-16 MED ORDER — POTASSIUM CHLORIDE CRYS ER 20 MEQ PO TBCR: 20.0000 meq | EXTENDED_RELEASE_TABLET | ORAL | 3 refills | Status: DC | PRN

## 2021-07-16 MED ORDER — FUROSEMIDE 40 MG PO TABS: 40.0000 mg | ORAL_TABLET | Freq: Two times a day (BID) | ORAL | 3 refills | Status: DC | PRN

## 2021-07-16 NOTE — Progress Notes (Signed)
? ?Date:  07/16/2021  ? ?ID:  Edward Weaver, DOB 1956-01-30, MRN [id number] ? ?PCP:  Aida Puffer, MD  ?Cardiologist:  Tessa Lerner, DO, [facility] (established care 08/13/2020) ? ?Date: 07/16/21 ?Last Office Visit: 09/09/2020 ? ?Chief Complaint  ?Patient presents with  ? Fatigue  ? Loss of Consciousness  ? ? ?HPI  ?Edward Weaver is a 66 y.o. male who presents to the office with a chief complaint of " heart failure management and recent hospitalization." Patient's past medical history and cardiovascular risk factors include: Chronic HFrEF, OSA not CPAP, Atrial Fibrillation, TIA, Hypertension, Hyperlipidemia, bilateral carotid artery stenosis, obesity due to excess calories. ? ?He was originally referred to the office at the request of Aida Puffer, MD for evaluation of atrial fibrillation. ? ?Patient is accompanied by his wife [name] at today's office visit. ? ?Patient has had difficulty with compliance regarding medication as well as follow-up, with multiple no-shows in the past.  He now presents today for urgent visit with concerns of dizziness upon standing occurring several times in the last 2 weeks as well as orthopnea, dyspnea on exertion, and leg swelling over the last 1 month.  He has also had occasional brief episodes of chest pain, primarily at rest which she describes as sharp and without associated symptoms. ? ?FUNCTIONAL STATUS: ?No structured exercise program or daily routine.  ? ?ALLERGIES: ?Allergies  ?Allergen Reactions  ? Penicillins Other (See Comments)  ?  Syncope, hypotension  ? ? ?MEDICATION LIST PRIOR TO VISIT: ?Current Meds  ?Medication Sig  ? aspirin EC 81 MG tablet Take 1 tablet (81 mg total) by mouth daily. Swallow whole.  ? atorvastatin (LIPITOR) 40 MG tablet Take 1 tablet (40 mg total) by mouth at bedtime.  ? ENTRESTO 97-103 MG TAKE 1 TABLET BY MOUTH 2 TIMES DAILY.  ? furosemide (LASIX) 40 MG tablet Take 1 tablet (40 mg total) by mouth 2 (two) times daily as needed for fluid or edema.  ?  gabapentin (NEURONTIN) 300 MG capsule TAKE 1 CAPSULE (300 MG TOTAL) BY MOUTH 3 (THREE) TIMES DAILY. (Patient taking differently: Take by mouth 2 (two) times daily.)  ? metoprolol succinate (TOPROL XL) 100 MG 24 hr tablet Take 1 tablet (100 mg total) by mouth every morning.  ? potassium chloride SA (KLOR-CON M20) 20 MEQ tablet Take 1 tablet (20 mEq total) by mouth as needed (with Lasix).  ? XARELTO 20 MG TABS tablet TAKE 1 TABLET BY MOUTH DAILY  ? [DISCONTINUED] furosemide (LASIX) 40 MG tablet TAKE 1 TABLET (40 MG TOTAL) BY MOUTH DAILY. (Patient taking differently: Take 40 mg by mouth as needed.)  ?  ? ?PAST MEDICAL HISTORY: ?Past Medical History:  ?Diagnosis Date  ? Arthritis   ? Atrial fibrillation (HCC)   ? Bell's palsy   ? Carotid artery stenosis, asymptomatic, bilateral   ? Edema   ? Hiatal hernia   ? Hyperlipidemia   ? Hypertension   ? Stroke [facility])   ? TIA (transient ischemic attack)   ? Nov and/or Dec 2014  ? ? ?PAST SURGICAL HISTORY: ?Past Surgical History:  ?Procedure Laterality Date  ? ESOPHAGUS SURGERY    ? KNEE SURGERY Right   ? 1974  ? TONSILLECTOMY    ? WISDOM TOOTH EXTRACTION    ? ? ?FAMILY HISTORY: ?The patient family history includes Alzheimer's disease in his father and paternal aunt; Cancer - Other in his mother; Dementia in his paternal aunt; Healthy in his brother and son; Heart disease in his mother;  Hypertension in his father and mother; Stroke in his paternal aunt; Stroke (age of onset: 59) in his brother. ? ?SOCIAL HISTORY:  ?The patient  reports that he has never smoked. He has never used smokeless tobacco. He reports that he does not drink alcohol and does not use drugs. ? ?REVIEW OF SYSTEMS: ?Review of Systems  ?Constitutional: Negative for chills and fever.  ?HENT:  Negative for hoarse voice and nosebleeds.   ?Eyes:  Negative for discharge, double vision and pain.  ?Cardiovascular:  Positive for dyspnea on exertion (chronic and stable.), leg swelling and orthopnea. Negative for chest  pain, claudication, near-syncope, palpitations, paroxysmal nocturnal dyspnea and syncope.  ?Respiratory:  Positive for snoring. Negative for hemoptysis.   ?Musculoskeletal:  Negative for muscle cramps and myalgias.  ?Gastrointestinal:  Negative for abdominal pain, constipation, diarrhea, hematemesis, hematochezia, melena, nausea and vomiting.  ?Neurological:  Negative for dizziness and light-headedness.  ? ?PHYSICAL EXAM: ? ?  07/16/2021  ? 10:17 AM 04/26/2021  ?  4:13 PM 04/26/2021  ?  2:46 PM  ?Vitals with BMI  ?Height 5\' 6"     ?Weight 278 lbs 10 oz    ?BMI 44.99    ?Systolic 127 136  ?Diastolic 77 96 82  ?Pulse 56 66 55  ? ?Orthostatic VS for the past 72 hrs (Last 3 readings): ? Orthostatic BP Patient Position BP Location Cuff Size Orthostatic Pulse  ?07/16/21 1029 141/77 Standing Left Arm Large 59  ?07/16/21 1027 125/75 Sitting Left Arm Large 51  ?07/16/21 1026 140/87 Supine Left Arm Large (!) 47  ? ? ? ?CONSTITUTIONAL: Appears older than stated age, hemodynamically stable, no acute distress.    ?SKIN: Skin is warm and dry. No rash noted. No cyanosis. No pallor. No jaundice ?HEAD: Normocephalic and atraumatic.  ?EYES: No scleral icterus ?MOUTH/THROAT: Moist oral membranes.  ?NECK: Unable to evaluate JVP due to a short neck stature, no JVD present. No thyromegaly noted. No carotid bruits  ?LYMPHATIC: No visible cervical adenopathy.  ?CHEST Normal respiratory effort. No intercostal retractions  ?LUNGS: Clear to auscultation bilaterally with decreased breath sounds at the bases.  No stridor. No wheezes. No rales.  ?CARDIOVASCULAR: Irregularly irregular, variable S1-S2, no murmurs rubs or gallops appreciated. ?ABDOMINAL: Obese, soft, nontender, nondistended, positive bowel sounds in all 4 quadrants, no abdominal bruits appreciated.  No apparent ascites.  ?EXTREMITIES: +2 pitting edema bilaterally, findings of chronic venous insufficiency, +2 dorsalis pedis and posterior tibial pulses, poor nail hygiene.    ?HEMATOLOGIC: No significant bruising ?NEUROLOGIC: Oriented to person, place, and time. Nonfocal. Normal muscle tone.  ?PSYCHIATRIC: Normal mood and affect. Normal behavior. Cooperative ? ?CARDIAC DATABASE: ?EKG: ?01/15/2019: Atrial fibrillation, 86 bpm. ?07/16/2021: Atrial fibrillation at a rate of 50 bpm.  Normal axis.  No evidence of ischemia or underlying injury pattern, relatively unchanged for an EKG 01/15/2019. ? ?Echocardiogram: ?08/22/2020: ?Study Quality: Technically difficult study. ?Mildly depressed LV systolic function with visual EF 45-50%. Left ventricle cavity is normal in size. Moderate left ventricular hypertrophy. Normal global wall motion. Unable to evaluate diastolic function due to atrial fibrillation. Elevated LAP. ?Left atrial cavity is moderately dilated. ?Mild (Grade I) mitral regurgitation. ?Mild tricuspid regurgitation. ?IVC is normal with a respiratory response of <50%. ?Prior study dated 01/11/2017: LVEF 50-55%, mild LVH, no RWMA, mild LAE, mildRAE, mildly dilated RV. ? ?Stress Testing: ?Lexiscan Tetrofosmin stress test 08/28/2020: ?Stress EKG is non-diagnostic, as this is pharmacological stress test using Lexiscan. Stress EKG is non-diagnostic, as this is pharmacological stress test. Rest and stress  EKG showed atrial fibrillation, no ST-T abnormality. ?SPECT images show normal stress myocardial perfusion. Decreased tracer uptake in inferior myocardium at rest likely due diaphragmatic attenuation. Stress LVEF calculated 48%, but visually appears normal. ?Low risk study. ? ?Heart Catheterization: ?None  ? ?Carotid Duplex: ?07/11/2021:  ?Duplex suggests stenosis in the right internal carotid artery (50-69%).  ?Duplex suggests stenosis in the left internal carotid artery (16-49%).  ?There is diffuse homogeneous plaque in the bilateral carotid arteries.  ?Antegrade right vertebral artery flow. Antegrade left vertebral artery flow.  ?No significant change since 08/22/2020. Follow up in six months  is appropriate if clinically indicated. ? ?LABORATORY DATA: ? ?  Latest Ref Rng & Units 12/31/2020  ?  6:50 PM 04/24/2019  ?  9:42 AM 10/15/2017  ?  2:08 PM  ?CBC  ?WBC 4.0 - 10.5 K/uL 8.5   6.2   6.0    ?Hemoglobin 13.0 - 1

## 2021-07-22 ENCOUNTER — Ambulatory Visit: Payer: Medicare Other | Admitting: Cardiology

## 2021-07-22 ENCOUNTER — Other Ambulatory Visit: Payer: Medicare Other

## 2021-07-23 ENCOUNTER — Ambulatory Visit: Payer: Medicare Other

## 2021-07-23 DIAGNOSIS — I5022 Chronic systolic (congestive) heart failure: Secondary | ICD-10-CM | POA: Diagnosis not present

## 2021-07-23 DIAGNOSIS — I4819 Other persistent atrial fibrillation: Secondary | ICD-10-CM | POA: Diagnosis not present

## 2021-07-24 LAB — BASIC METABOLIC PANEL
BUN: 17 mg/dL (ref 7–25)
CO2: 25 mmol/L (ref 20–32)
Calcium: 9.4 mg/dL (ref 8.6–10.3)
Chloride: 106 mmol/L (ref 98–110)
Creat: 1.17 mg/dL (ref 0.70–1.35)
Glucose, Bld: 106 mg/dL — ABNORMAL HIGH (ref 65–99)
Potassium: 4.9 mmol/L (ref 3.5–5.3)
Sodium: 139 mmol/L (ref 135–146)

## 2021-07-24 LAB — BRAIN NATRIURETIC PEPTIDE: Brain Natriuretic Peptide: 177 pg/mL — ABNORMAL HIGH (ref <100)

## 2021-07-25 NOTE — Progress Notes (Signed)
Called patient, NA, left results on VM.

## 2021-07-28 NOTE — Progress Notes (Signed)
Seeing you 07/31/2021

## 2021-07-30 ENCOUNTER — Ambulatory Visit: Payer: Medicare Other | Admitting: Cardiology

## 2021-07-31 ENCOUNTER — Encounter: Payer: Self-pay | Admitting: Cardiology

## 2021-07-31 ENCOUNTER — Ambulatory Visit: Payer: Medicare Other | Admitting: Cardiology

## 2021-07-31 VITALS — BP 113/68 | HR 58 | Temp 98.2°F | Resp 16 | Ht 66.0 in | Wt 275.0 lb

## 2021-07-31 DIAGNOSIS — I10 Essential (primary) hypertension: Secondary | ICD-10-CM

## 2021-07-31 DIAGNOSIS — I5032 Chronic diastolic (congestive) heart failure: Secondary | ICD-10-CM | POA: Diagnosis not present

## 2021-07-31 DIAGNOSIS — E7800 Pure hypercholesterolemia, unspecified: Secondary | ICD-10-CM | POA: Diagnosis not present

## 2021-07-31 DIAGNOSIS — Z7901 Long term (current) use of anticoagulants: Secondary | ICD-10-CM

## 2021-07-31 DIAGNOSIS — I4819 Other persistent atrial fibrillation: Secondary | ICD-10-CM

## 2021-07-31 DIAGNOSIS — I6523 Occlusion and stenosis of bilateral carotid arteries: Secondary | ICD-10-CM

## 2021-07-31 DIAGNOSIS — G4733 Obstructive sleep apnea (adult) (pediatric): Secondary | ICD-10-CM

## 2021-07-31 DIAGNOSIS — G459 Transient cerebral ischemic attack, unspecified: Secondary | ICD-10-CM

## 2021-07-31 MED ORDER — ATORVASTATIN CALCIUM 40 MG PO TABS: 40.0000 mg | ORAL_TABLET | Freq: Every day | ORAL | 0 refills | Status: DC

## 2021-07-31 MED ORDER — DAPAGLIFLOZIN PROPANEDIOL 10 MG PO TABS: 10.0000 mg | ORAL_TABLET | Freq: Every day | ORAL | 0 refills | Status: AC

## 2021-07-31 NOTE — Progress Notes (Signed)
Date:  08/11/2021   ID:  Edward Weaver, DOB 1956/02/08, MRN [id number]  PCP:  Pcp, No  Cardiologist:  Rex Kras, DO, [place] (established care 08/13/2020)  Date: 07/31/21 Last Office Visit: 07/16/2021  Chief Complaint  Patient presents with   heart failure management   Follow-up    HPI  Edward Weaver is a 66 y.o. male who presents to the office with a chief complaint of " heart failure management and recent hospitalization." Patient's past medical history and cardiovascular risk factors include: Chronic HFrEF, OSA not CPAP, Atrial Fibrillation, TIA, Hypertension, Hyperlipidemia, bilateral carotid artery stenosis, obesity due to excess calories.  Patient is accompanied by his wife Edward Weaver at today's office visit.  Initially referred to the practice for management of atrial fibrillation.  He is currently on rate control strategy as well as oral anticoagulation for thromboembolic prophylaxis.  Patient refused to be on antiarrhythmic medications are being considered for atrial fibrillation ablation or cardioversion.  He is tolerating his pharmacological therapy well without any side effects or intolerances.  Given his atrial fibrillation, reduced LVEF, and structural changes patient was started on guideline directed medical therapy.  Up titration has been difficult as patient is noncompliant with outpatient follow-up visits regularly.  Clinically denies anginal discomfort or heart failure symptoms.  Since last office visit patient had a repeat echocardiogram for follow-up LVEF has improved to low normal 50-55%, and has mild/moderate valvular heart disease.  Mildly dilated left atrium.  Patient also has bilateral asymptomatic carotid stenosis which is being followed by the practice.  Last study performed in April 2023 which noted improvement in disease burden involving that left ICA.  No significant change in the right ICA.Marland Kitchen  Unfortunately, patient has not been working on lifestyle changes  with regards to weight loss, glycemic control, improving lipids, noncompliant with CPAP, etc.  FUNCTIONAL STATUS: No structured exercise program or daily routine.   ALLERGIES: Allergies  Allergen Reactions   Penicillins Other (See Comments)    Syncope, hypotension    MEDICATION LIST PRIOR TO VISIT: Current Meds  Medication Sig   aspirin EC 81 MG tablet Take 1 tablet (81 mg total) by mouth daily. Swallow whole.   dapagliflozin propanediol (FARXIGA) 10 MG TABS tablet Take 1 tablet (10 mg total) by mouth daily before breakfast.   ENTRESTO 97-103 MG TAKE 1 TABLET BY MOUTH 2 TIMES DAILY.   gabapentin (NEURONTIN) 300 MG capsule TAKE 1 CAPSULE (300 MG TOTAL) BY MOUTH 3 (THREE) TIMES DAILY. (Patient taking differently: Take by mouth 2 (two) times daily.)   metoprolol succinate (TOPROL XL) 100 MG 24 hr tablet Take 1 tablet (100 mg total) by mouth every morning.   XARELTO 20 MG TABS tablet TAKE 1 TABLET BY MOUTH DAILY   [DISCONTINUED] atorvastatin (LIPITOR) 40 MG tablet Take 1 tablet (40 mg total) by mouth at bedtime.     PAST MEDICAL HISTORY: Past Medical History:  Diagnosis Date   Arthritis    Atrial fibrillation ([place])    Bell's palsy    Carotid artery stenosis, asymptomatic, bilateral    Edema    Hiatal hernia    Hyperlipidemia    Hypertension    Stroke [facility])    TIA (transient ischemic attack)    Nov and/or Dec 2014    PAST SURGICAL HISTORY: Past Surgical History:  Procedure Laterality Date   ESOPHAGUS SURGERY     KNEE SURGERY Right    1974   TONSILLECTOMY     WISDOM TOOTH EXTRACTION  FAMILY HISTORY: The patient family history includes Alzheimer's disease in his father and paternal aunt; Cancer - Other in his mother; Dementia in his paternal aunt; Healthy in his brother and son; Heart disease in his mother; Hypertension in his father and mother; Stroke in his paternal aunt; Stroke (age of onset: 42) in his brother.  SOCIAL HISTORY:  The patient  reports that he has  never smoked. He has never used smokeless tobacco. He reports that he does not drink alcohol and does not use drugs.  REVIEW OF SYSTEMS: Review of Systems  Constitutional: Negative for chills and fever.  HENT:  Negative for hoarse voice and nosebleeds.   Eyes:  Negative for discharge, double vision and pain.  Cardiovascular:  Positive for dyspnea on exertion (chronic and stable.). Negative for chest pain, claudication, leg swelling, near-syncope, orthopnea, palpitations, paroxysmal nocturnal dyspnea and syncope.  Respiratory:  Positive for shortness of breath and snoring. Negative for hemoptysis.   Musculoskeletal:  Negative for muscle cramps and myalgias.  Gastrointestinal:  Negative for abdominal pain, constipation, diarrhea, hematemesis, hematochezia, melena, nausea and vomiting.  Neurological:  Negative for dizziness and light-headedness.   PHYSICAL EXAM:    07/31/2021    2:02 PM 07/16/2021   10:17 AM 04/26/2021    4:13 PM  Vitals with BMI  Height 5' 6"  5' 6"    Weight 275 lbs 278 lbs 10 oz   BMI 77.82 42.35   Systolic 361 443 154  Diastolic 68 77 96  Pulse 58 56 66    CONSTITUTIONAL: Appears older than stated age, hemodynamically stable, no acute distress.    SKIN: Skin is warm and dry. No rash noted. No cyanosis. No pallor. No jaundice HEAD: Normocephalic and atraumatic.  EYES: No scleral icterus MOUTH/THROAT: Moist oral membranes.  NECK: Unable to evaluate JVP due to a short neck stature, no JVD present. No thyromegaly noted. No carotid bruits  CHEST Normal respiratory effort. No intercostal retractions  LUNGS: Clear to auscultation bilaterally with decreased breath sounds at the bases.  No stridor. No wheezes. No rales.  CARDIOVASCULAR: Irregularly irregular, variable S1-S2, no murmurs rubs or gallops appreciated. ABDOMINAL: Obese, soft, nontender, nondistended, positive bowel sounds in all 4 quadrants, no abdominal bruits appreciated.  No apparent ascites.  EXTREMITIES: +1  pitting edema bilaterally, findings of chronic venous insufficiency, +2 dorsalis pedis and posterior tibial pulses, poor nail hygiene.   HEMATOLOGIC: No significant bruising NEUROLOGIC: Oriented to person, place, and time. Nonfocal. Normal muscle tone.  PSYCHIATRIC: Normal mood and affect. Normal behavior. Cooperative  CARDIAC DATABASE: EKG: 01/15/2019: Atrial fibrillation, 86 bpm.  Echocardiogram: 01/11/2017: LVEF 50-55%  08/22/2020: LVEF 45-50%, moderate LVH, elevated LAP, moderately dilated left atrium, mild MR, mild TR.  07/23/2021:  Left ventricle cavity is normal in size. Moderate concentric hypertrophy of the left ventricle. Normal LV systolic function with visual EF 50-55%.  Normal global wall motion. Unable to evaluate diastolic function due to atrial fibrillation.  Left atrial cavity is mildly dilated.  Mild to moderate mitral regurgitation.  Mild to moderate tricuspid regurgitation.  No evidence of pulmonary hypertension.  No significant change compared to previous study on 08/22/2020.  Stress Testing: Lexiscan Tetrofosmin stress test 08/28/2020: Stress EKG is non-diagnostic, as this is pharmacological stress test using Lexiscan. Stress EKG is non-diagnostic, as this is pharmacological stress test. Rest and stress EKG showed atrial fibrillation, no ST-T abnormality. SPECT images show normal stress myocardial perfusion. Decreased tracer uptake in inferior myocardium at rest likely due diaphragmatic attenuation. Stress LVEF calculated 48%,  but visually appears normal. Low risk study.  Heart Catheterization: None   Carotid Duplex: Carotid artery duplex 07/11/2021:  Duplex suggests stenosis in the right internal carotid artery (50-69%).  Duplex suggests stenosis in the left internal carotid artery (16-49%).  There is diffuse homogeneous plaque in the bilateral carotid arteries.  Antegrade right vertebral artery flow. Antegrade left vertebral artery flow.  No significant change  since 08/22/2020. Follow up in six months is appropriate if clinically indicated.  LABORATORY DATA:    Latest Ref Rng & Units 12/31/2020    6:50 PM 04/24/2019    9:42 AM 10/15/2017    2:08 PM  CBC  WBC 4.0 - 10.5 K/uL 8.5   6.2   6.0    Hemoglobin 13.0 - 17.0 g/dL 14.4   14.9   14.8    Hematocrit 39.0 - 52.0 % 43.6   44.4   45.1    Platelets 150 - 400 K/uL 305   317   302         Latest Ref Rng & Units 07/23/2021    9:00 AM 12/31/2020    6:50 PM 09/10/2020   12:55 PM  CMP  Glucose 65 - 99 mg/dL 106   141   103    BUN 7 - 25 mg/dL 17   15   11     Creatinine 0.70 - 1.35 mg/dL 1.17   1.14   1.08    Sodium 135 - 146 mmol/L 139   135   140    Potassium 3.5 - 5.3 mmol/L 4.9   3.9   4.9    Chloride 98 - 110 mmol/L 106   101   103    CO2 20 - 32 mmol/L 25   26   26     Calcium 8.6 - 10.3 mg/dL 9.4   9.0   9.0    Total Protein 6.5 - 8.1 g/dL  7.1   6.5    Total Bilirubin 0.3 - 1.2 mg/dL  0.8   0.5    Alkaline Phos 38 - 126 U/L  54   70    AST 15 - 41 U/L  16   16    ALT 0 - 44 U/L  27   35      Lipid Panel  Lab Results  Component Value Date   CHOL 225 (H) 09/10/2020   HDL 46 09/10/2020   LDLCALC 141 (H) 09/10/2020   LDLDIRECT 149 (H) 09/10/2020   TRIG 211 (H) 09/10/2020   CHOLHDL 5.5 01/11/2017    No components found for: NTPROBNP Recent Labs    09/10/20 1255  PROBNP 980*   No results for input(s): TSH in the last 8760 hours.  BMP Recent Labs    09/10/20 1255 12/31/20 1850 07/23/21 0900  NA 140 135 139  K 4.9 3.9 4.9  CL 103 101 106  CO2 26 26 25   GLUCOSE 103* 141* 106*  BUN 11 15 17   CREATININE 1.08 1.14 1.17  CALCIUM 9.0 9.0 9.4  GFRNONAA  --  >60  --     HEMOGLOBIN A1C Lab Results  Component Value Date   HGBA1C 5.9 (A) 05/19/2018   MPG 125.5 01/10/2017   External Labs: Collected: 08/06/2020 provided by PCP Creatinine 1 mg/dL. eGFR: 79 mL/min per 1.73 m Lipid profile: Total cholesterol 218, triglycerides 269, HDL 45, LDL 132, non HDL 173 Hemoglobin  A1c: 6 TSH: 2.06  IMPRESSION:    ICD-10-CM   1. Chronic heart failure with  preserved ejection fraction (HFpEF) (HCC)  I50.32 dapagliflozin propanediol (FARXIGA) 10 MG TABS tablet    Basic metabolic panel    Magnesium    Pro b natriuretic peptide (BNP)    2. Persistent atrial fibrillation (HCC)  I48.19     3. Long term (current) use of anticoagulants  Z79.01     4. Hypercholesterolemia  E78.00 atorvastatin (LIPITOR) 40 MG tablet    5. Bilateral carotid artery stenosis  I65.23     6. OSA (obstructive sleep apnea)  G47.33     7. Benign hypertension  I10     8. TIA (transient ischemic attack)  G45.9     9. Class 3 severe obesity due to excess calories with serious comorbidity and body mass index (BMI) of 40.0 to 44.9 in adult [facility])  E66.01    Z68.41        RECOMMENDATIONS: Edward Weaver is a 66 y.o. male whose past medical history and cardiac risk factors include: Prediabetes, OSA not CPAP, Atrial Fibrillation, TIA, Hypertension, Hyperlipidemia, bilateral carotid artery stenosis, obesity due to excess calories.  Chronic heart failure with preserved ejection fraction (HFpEF) (HCC) Stage B, NYHA class II. Recent labs from May 2023 independently reviewed, BNP slightly elevated.  Clinically feels better compared to the last visit. Continue Entresto and metoprolol. Discontinue Lasix. Start Farxiga 10 mg p.o. daily.  Patient assistance card and samples provided. Labs in 1 week to evaluate kidney function and electrolytes. Educated on the importance of improving his modifiable cardiovascular risk factors. Reemphasized importance of a low-salt diet. Strict I's and O's and daily weights. If he gains more than 1 pound over 24 hours or 3pounds over 1 week he is asked to call the office for further guidance.  Persistent atrial fibrillation (HCC) Persistent, per patient diagnosed in 2018. Rate control: Toprol-XL. Rhythm control: N/A. Thromboembolic prophylaxis: Xarelto During  multiple visits we have discussed the importance of restoring normal sinus rhythm and maintenance of sinus rhythm.  Patient is not interested in considering antiarrhythmic medications or procedures such as cardioversion or ablation. Continue medical management for now.  Long term (current) use of anticoagulants Indication: Persistent atrial fibrillation. Remains on Xarelto since 2018. Does not endorse evidence of bleeding. Reemphasized the risks, benefits, and alternatives to oral anticoagulation with both the patient and his wife.  They are agreeable and would like to continue anticoagulation.  Hypercholesterolemia Cholesterol levels are not well controlled. Currently not on pharmacological therapy. Patient informs me that his PCP is in the process of retiring and is requesting assistance with lipid management. We will start atorvastatin 40 mg p.o. nightly. If able to tolerate he will need repeat lipid profile and CMP in 6 weeks  Bilateral carotid artery stenosis Asymptomatic. Left ICA improved when compared to prior study and the right ICA remains stable. No focal neurological deficits. Continue pharmacological therapy. We will repeat carotid duplex in 1 year for longitudinal follow-up.  OSA (obstructive sleep apnea) Reemphasized the importance of CPAP. In the past I have referred him to sleep medicine. Patient states that once he reestablishes with PCP he will work on this.  Benign hypertension Office blood pressures are very well controlled. Continue current medical therapy  TIA (transient ischemic attack) Education importance of secondary prevention  Class 3 severe obesity due to excess calories with serious comorbidity and body mass index (BMI) of 40.0 to 44.9 in adult [facility]) Body mass index is 44.39 kg/m. I reviewed with the patient the importance of diet, regular physical activity/exercise, weight loss.  Patient is educated on increasing physical activity gradually as  tolerated.  With the goal of moderate intensity exercise for 30 minutes a day 5 days a week.   FINAL MEDICATION LIST END OF ENCOUNTER: Meds ordered this encounter  Medications   dapagliflozin propanediol (FARXIGA) 10 MG TABS tablet    Sig: Take 1 tablet (10 mg total) by mouth daily before breakfast.    Dispense:  90 tablet    Refill:  0   atorvastatin (LIPITOR) 40 MG tablet    Sig: Take 1 tablet (40 mg total) by mouth at bedtime.    Dispense:  90 tablet    Refill:  0     Medications Discontinued During This Encounter  Medication Reason   atorvastatin (LIPITOR) 40 MG tablet Reorder   furosemide (LASIX) 40 MG tablet Change in therapy   potassium chloride SA (KLOR-CON M20) 20 MEQ tablet Change in therapy     Current Outpatient Medications:    aspirin EC 81 MG tablet, Take 1 tablet (81 mg total) by mouth daily. Swallow whole., Disp: 30 tablet, Rfl: 11   dapagliflozin propanediol (FARXIGA) 10 MG TABS tablet, Take 1 tablet (10 mg total) by mouth daily before breakfast., Disp: 90 tablet, Rfl: 0   ENTRESTO 97-103 MG, TAKE 1 TABLET BY MOUTH 2 TIMES DAILY., Disp: 60 tablet, Rfl: 0   gabapentin (NEURONTIN) 300 MG capsule, TAKE 1 CAPSULE (300 MG TOTAL) BY MOUTH 3 (THREE) TIMES DAILY. (Patient taking differently: Take by mouth 2 (two) times daily.), Disp: 90 capsule, Rfl: 3   metoprolol succinate (TOPROL XL) 100 MG 24 hr tablet, Take 1 tablet (100 mg total) by mouth every morning., Disp: 30 tablet, Rfl: 0   XARELTO 20 MG TABS tablet, TAKE 1 TABLET BY MOUTH DAILY, Disp: 30 tablet, Rfl: 0   atorvastatin (LIPITOR) 40 MG tablet, Take 1 tablet (40 mg total) by mouth at bedtime., Disp: 90 tablet, Rfl: 0  Orders Placed This Encounter  Procedures   Basic metabolic panel   Magnesium   Pro b natriuretic peptide (BNP)    There are no Patient Instructions on file for this visit.   --Continue cardiac medications as reconciled in final medication list. --Return in about 6 months (around 01/31/2022)  for Follow up, A. fib, heart failure management.. Or sooner if needed. --Continue follow-up with your primary care physician regarding the management of your other chronic comorbid conditions.  Patient's questions and concerns were addressed to his satisfaction. He voices understanding of the instructions provided during this encounter.   This note was created using a voice recognition software as a result there may be grammatical errors inadvertently enclosed that do not reflect the nature of this encounter. Every attempt is made to correct such errors.  Total time spent: 45 minutes.  As part of this visit reviewed ED records from 12/31/2020 which included note, EKG, labs these findings have been summarized and noted above for further reference.  Discussed disease management, ordering diagnostic testing, coordination of care and patient education provided as a part of today's encounter.   Rex Kras, [place], [facility]  Pager: [phone] Office: [phone]

## 2021-08-11 ENCOUNTER — Other Ambulatory Visit: Payer: Self-pay | Admitting: Cardiology

## 2021-08-11 DIAGNOSIS — I5032 Chronic diastolic (congestive) heart failure: Secondary | ICD-10-CM

## 2021-08-15 ENCOUNTER — Other Ambulatory Visit: Payer: Self-pay | Admitting: Cardiology

## 2021-08-15 DIAGNOSIS — Z8679 Personal history of other diseases of the circulatory system: Secondary | ICD-10-CM

## 2021-08-20 ENCOUNTER — Other Ambulatory Visit: Payer: Self-pay | Admitting: Cardiology

## 2021-08-20 DIAGNOSIS — G629 Polyneuropathy, unspecified: Secondary | ICD-10-CM

## 2021-08-20 DIAGNOSIS — F419 Anxiety disorder, unspecified: Secondary | ICD-10-CM

## 2021-08-25 ENCOUNTER — Telehealth: Payer: Self-pay

## 2021-08-25 NOTE — Telephone Encounter (Signed)
I do not refill gabapentin.  Please refer him back to his PCP or his original provider.

## 2021-08-25 NOTE — Telephone Encounter (Signed)
Pt called requesting a gabapentin refill. He would like to know if you could send this in. Please advise.

## 2021-08-26 ENCOUNTER — Telehealth: Payer: Self-pay

## 2021-08-26 ENCOUNTER — Encounter (HOSPITAL_BASED_OUTPATIENT_CLINIC_OR_DEPARTMENT_OTHER): Payer: Self-pay

## 2021-08-26 ENCOUNTER — Emergency Department (HOSPITAL_BASED_OUTPATIENT_CLINIC_OR_DEPARTMENT_OTHER)
Admission: EM | Admit: 2021-08-26 | Discharge: 2021-08-26 | Disposition: A | Discharge status: Home / Self Care | Payer: Medicare Other | Attending: Emergency Medicine | Admitting: Emergency Medicine

## 2021-08-26 DIAGNOSIS — Z7982 Long term (current) use of aspirin: Secondary | ICD-10-CM | POA: Insufficient documentation

## 2021-08-26 DIAGNOSIS — K0381 Cracked tooth: Secondary | ICD-10-CM | POA: Insufficient documentation

## 2021-08-26 DIAGNOSIS — Z7901 Long term (current) use of anticoagulants: Secondary | ICD-10-CM | POA: Insufficient documentation

## 2021-08-26 DIAGNOSIS — K0889 Other specified disorders of teeth and supporting structures: Secondary | ICD-10-CM

## 2021-08-26 MED ORDER — OXYCODONE-ACETAMINOPHEN 5-325 MG PO TABS
1.0000 | ORAL_TABLET | Freq: Once | ORAL | Status: AC
  Administered 2021-08-26: 1 via ORAL
  Filled 2021-08-26: qty 1

## 2021-08-26 MED ORDER — CLINDAMYCIN HCL 150 MG PO CAPS: 150.0000 mg | ORAL_CAPSULE | Freq: Three times a day (TID) | ORAL | 0 refills | Status: DC

## 2021-08-26 MED ORDER — CLINDAMYCIN HCL 150 MG PO CAPS: 150.0000 mg | ORAL_CAPSULE | Freq: Three times a day (TID) | ORAL | 0 refills | Status: AC

## 2021-08-26 NOTE — Telephone Encounter (Signed)
His PCP, has left the practice and moved out of the [place], this is only a one time thing. Please advise.

## 2021-08-26 NOTE — ED Provider Notes (Signed)
[facility] EMERGENCY DEPARTMENT Provider Note   CSN: [id number] Arrival date & time: 08/26/21  1655     History  Chief Complaint  Patient presents with   Dental Pain    Edward Weaver is a 66 y.o. male.  HPI 66 year old male presents the ER today for evaluation of dental pain.  Symptoms began yesterday.  He does have a procedure scheduled in 2 weeks with an oral surgeon to have his tooth removed.  Patient states that he is taken no medications for pain prior to arrival.  Denies any fevers or chills.  No difficulties breathing or swallowing    Home Medications Prior to Admission medications   Medication Sig Start Date End Date Taking? Authorizing Provider  aspirin EC 81 MG tablet Take 1 tablet (81 mg total) by mouth daily. Swallow whole. 01/14/21   Tolia, Sunit, DO  atorvastatin (LIPITOR) 40 MG tablet Take 1 tablet (40 mg total) by mouth at bedtime. 07/31/21 10/29/21  Tolia, Sunit, DO  clindamycin (CLEOCIN) 150 MG capsule Take 1 capsule (150 mg total) by mouth 3 (three) times daily for 7 days. 08/26/21 09/02/21  Rise Mu, PA-C  dapagliflozin propanediol (FARXIGA) 10 MG TABS tablet Take 1 tablet (10 mg total) by mouth daily before breakfast. 07/31/21 10/29/21  Tolia, Sunit, DO  ENTRESTO 97-103 MG TAKE 1 TABLET BY MOUTH 2 TIMES DAILY. 06/30/21   Tolia, Sunit, DO  gabapentin (NEURONTIN) 300 MG capsule TAKE 1 CAPSULE (300 MG TOTAL) BY MOUTH 3 (THREE) TIMES DAILY. Patient taking differently: Take by mouth 2 (two) times daily. 01/17/20 07/31/21  Barbette Merino, NP  metoprolol succinate (TOPROL XL) 100 MG 24 hr tablet Take 1 tablet (100 mg total) by mouth every morning. 09/09/20 07/31/21  Tolia, Sunit, DO  XARELTO 20 MG TABS tablet TAKE 1 TABLET BY MOUTH DAILY 08/15/21   Tolia, Sunit, DO      Allergies    Penicillins    Review of Systems   Review of Systems Please refer to the HPI Physical Exam Updated Vital Signs BP (!) 117/91   Pulse (!) 105   Temp 98.5 F (36.9 C)  (Oral)   Resp 18   Ht 5\' 6"  (1.676 m)   Wt 127 kg   SpO2 97%   BMI 45.19 kg/m  Physical Exam Vitals and nursing note reviewed.  Constitutional:      General: He is not in acute distress.    Appearance: He is well-developed. He is not ill-appearing or toxic-appearing.  HENT:     Head: Normocephalic and atraumatic.     Mouth/Throat:     Comments: No trismus several dental caries noted with broken teeth.  Patient does have some mild gingival edema and tenderness palpation of the right lower gumline.  There is no gross abscess.  No sublingual or submandibular swelling.  Oropharynx is clear Eyes:     General: No scleral icterus.       Right eye: No discharge.        Left eye: No discharge.  Pulmonary:     Effort: No respiratory distress.  Musculoskeletal:        General: Normal range of motion.     Cervical back: Normal range of motion.  Skin:    Coloration: Skin is not pale.  Neurological:     Mental Status: He is alert.  Psychiatric:        Behavior: Behavior normal.        Thought Content: Thought content normal.  Judgment: Judgment normal.     ED Results / Procedures / Treatments   Labs (all labs ordered are listed, but only abnormal results are displayed) Labs Reviewed - No data to display  EKG None  Radiology No results found.  Procedures Procedures    Medications Ordered in ED Medications  oxyCODONE-acetaminophen (PERCOCET/ROXICET) 5-325 MG per tablet 1 tablet (1 tablet Oral Given 08/26/21 1810)    ED Course/ Medical Decision Making/ A&P                           Medical Decision Making Risk Prescription drug management.   Patient with dentalgia.  No abscess requiring immediate incision and drainage.  Exam not concerning for Ludwig's angina or pharyngeal abscess.  Will treat with clindamycin given penicillin allergy after discussion with pharmacy.  Patient encouraged to take probiotics to prevent C. difficile.. Pt instructed to follow-up with  dentist.  Discussed return precautions. Pt safe for discharge.         Final Clinical Impression(s) / ED Diagnoses Final diagnoses:  Pain, dental    Rx / DC Orders ED Discharge Orders          Ordered    clindamycin (CLEOCIN) 150 MG capsule  3 times daily,   Status:  Discontinued        08/26/21 1803    clindamycin (CLEOCIN) 150 MG capsule  3 times daily        08/26/21 1815              Rise Mu, PA-C 08/26/21 1837    Virgina Norfolk, DO 08/26/21 2121

## 2021-08-26 NOTE — Discharge Instructions (Signed)
You have a dental injury/infection. It is very important that you get evaluated by a dentist as soon as possible. Call tomorrow to schedule an appointment. Ibuprofen and tylenol as needed for pain. Take your full course of antibiotics. Read the instructions below.  Patient will you take the antibiotics you take a probiotic as well such as Culturelle probiotic or Activia yogurt.  Eat a soft or liquid diet and rinse your mouth out after meals with warm water. You should see a dentist or return here at once if you have increased swelling, increased pain or uncontrolled bleeding from the site of your injury.  SEEK MEDICAL CARE IF:  You have increased pain not controlled with medicines.  You have swelling around your tooth, in your face or neck.  You have bleeding which starts, continues, or gets worse.  You have a fever >101 If you are unable to open your mouth

## 2021-08-26 NOTE — Telephone Encounter (Signed)
Why does he need antibiotics prior to his tooth extraction?  His PCP should have coverage when out of the country - have him call the office and talk to the on call doctor.   Dr. Odis Hollingshead

## 2021-08-26 NOTE — ED Triage Notes (Signed)
States has a known abscess to right lower tooth. Tooth is broken. Has a procedure scheduled with dentist 6/27. Pain worsened yesterday

## 2021-08-26 NOTE — Telephone Encounter (Signed)
Called and spoke to pt, pt voiced understanding.

## 2021-08-26 NOTE — Telephone Encounter (Signed)
Patient called stating that he has a consultation with his dentist on the 20th and he needs an antibiotic sent in for a tooth extraction. Patient stated that [place] PCP is [place]. Please advise.

## 2021-08-27 NOTE — Telephone Encounter (Signed)
Patient called back, he contacted the dentist office and they told him they can prescribe an antibiotic for him after the first consult on Tuesday. Patient then asked for pain medication for the pain in the meantime and I explained to him that we only prescribe pain medication if we have done a procedure ourselves and advised him to go to urgent care if they pain becomes unbearable. Patient agreed.

## 2021-08-27 NOTE — Telephone Encounter (Signed)
Thank-you for closing the loop.   ST

## 2021-08-27 NOTE — Telephone Encounter (Signed)
Please follow up.   Dr. Klever [place]

## 2021-08-27 NOTE — Telephone Encounter (Signed)
I have contacted patient to give me number to his dentist office. He is going to call back to give me that information.

## 2021-08-29 ENCOUNTER — Other Ambulatory Visit: Payer: Self-pay | Admitting: Cardiology

## 2021-08-29 DIAGNOSIS — I5022 Chronic systolic (congestive) heart failure: Secondary | ICD-10-CM

## 2021-09-19 ENCOUNTER — Other Ambulatory Visit: Payer: Self-pay | Admitting: Cardiology

## 2021-09-19 DIAGNOSIS — Z8679 Personal history of other diseases of the circulatory system: Secondary | ICD-10-CM

## 2021-09-30 ENCOUNTER — Other Ambulatory Visit: Payer: Self-pay

## 2021-09-30 DIAGNOSIS — I5022 Chronic systolic (congestive) heart failure: Secondary | ICD-10-CM

## 2021-09-30 MED ORDER — ENTRESTO 97-103 MG PO TABS: 1.0000 | ORAL_TABLET | Freq: Two times a day (BID) | ORAL | 3 refills | Status: DC

## 2021-10-02 ENCOUNTER — Telehealth: Payer: Self-pay | Admitting: Cardiology

## 2021-10-02 NOTE — Telephone Encounter (Signed)
Called and spoke to patient made him aware we received his patient assistant and faxed just waiting on a response from them

## 2021-10-02 NOTE — Telephone Encounter (Signed)
Patient checking on patient assistance, please call back at number provided.

## 2021-10-06 ENCOUNTER — Other Ambulatory Visit (HOSPITAL_COMMUNITY): Payer: Self-pay

## 2021-10-14 ENCOUNTER — Other Ambulatory Visit: Payer: Self-pay | Admitting: Cardiology

## 2021-10-14 DIAGNOSIS — I4819 Other persistent atrial fibrillation: Secondary | ICD-10-CM

## 2021-11-14 ENCOUNTER — Other Ambulatory Visit: Payer: Self-pay | Admitting: Cardiology

## 2021-11-14 DIAGNOSIS — E7800 Pure hypercholesterolemia, unspecified: Secondary | ICD-10-CM

## 2021-11-24 ENCOUNTER — Other Ambulatory Visit: Payer: Self-pay

## 2021-11-24 DIAGNOSIS — Z8679 Personal history of other diseases of the circulatory system: Secondary | ICD-10-CM

## 2021-11-24 MED ORDER — RIVAROXABAN 20 MG PO TABS: 20.0000 mg | ORAL_TABLET | Freq: Every day | ORAL | 3 refills | Status: DC

## 2021-12-23 ENCOUNTER — Other Ambulatory Visit: Payer: Self-pay | Admitting: Cardiology

## 2021-12-23 DIAGNOSIS — G629 Polyneuropathy, unspecified: Secondary | ICD-10-CM

## 2021-12-23 DIAGNOSIS — F419 Anxiety disorder, unspecified: Secondary | ICD-10-CM

## 2021-12-26 ENCOUNTER — Other Ambulatory Visit: Payer: Self-pay

## 2021-12-26 ENCOUNTER — Encounter (HOSPITAL_BASED_OUTPATIENT_CLINIC_OR_DEPARTMENT_OTHER): Payer: Self-pay

## 2021-12-26 ENCOUNTER — Emergency Department (HOSPITAL_BASED_OUTPATIENT_CLINIC_OR_DEPARTMENT_OTHER): Payer: Medicare Other

## 2021-12-26 ENCOUNTER — Emergency Department (HOSPITAL_BASED_OUTPATIENT_CLINIC_OR_DEPARTMENT_OTHER)
Admission: EM | Admit: 2021-12-26 | Discharge: 2021-12-26 | Disposition: A | Discharge status: Home / Self Care | Payer: Medicare Other | Attending: Emergency Medicine | Admitting: Emergency Medicine

## 2021-12-26 DIAGNOSIS — Z20822 Contact with and (suspected) exposure to covid-19: Secondary | ICD-10-CM | POA: Diagnosis not present

## 2021-12-26 DIAGNOSIS — G629 Polyneuropathy, unspecified: Secondary | ICD-10-CM

## 2021-12-26 DIAGNOSIS — Z7982 Long term (current) use of aspirin: Secondary | ICD-10-CM | POA: Diagnosis not present

## 2021-12-26 DIAGNOSIS — J209 Acute bronchitis, unspecified: Secondary | ICD-10-CM | POA: Diagnosis not present

## 2021-12-26 DIAGNOSIS — Z7901 Long term (current) use of anticoagulants: Secondary | ICD-10-CM | POA: Diagnosis not present

## 2021-12-26 DIAGNOSIS — R600 Localized edema: Secondary | ICD-10-CM | POA: Insufficient documentation

## 2021-12-26 DIAGNOSIS — I4891 Unspecified atrial fibrillation: Secondary | ICD-10-CM | POA: Diagnosis not present

## 2021-12-26 DIAGNOSIS — M7989 Other specified soft tissue disorders: Secondary | ICD-10-CM | POA: Insufficient documentation

## 2021-12-26 DIAGNOSIS — R059 Cough, unspecified: Secondary | ICD-10-CM | POA: Diagnosis present

## 2021-12-26 DIAGNOSIS — F419 Anxiety disorder, unspecified: Secondary | ICD-10-CM

## 2021-12-26 DIAGNOSIS — R0602 Shortness of breath: Secondary | ICD-10-CM | POA: Diagnosis not present

## 2021-12-26 LAB — COMPREHENSIVE METABOLIC PANEL
ALT: 28 U/L (ref 0–44)
AST: 15 U/L (ref 15–41)
Albumin: 4 g/dL (ref 3.5–5.0)
Alkaline Phosphatase: 65 U/L (ref 38–126)
Anion gap: 8 (ref 5–15)
BUN: 15 mg/dL (ref 8–23)
CO2: 26 mmol/L (ref 22–32)
Calcium: 9.3 mg/dL (ref 8.9–10.3)
Chloride: 105 mmol/L (ref 98–111)
Creatinine, Ser: 1.01 mg/dL (ref 0.61–1.24)
GFR, Estimated: >60 mL/min (ref >60)
Glucose, Bld: 108 mg/dL — ABNORMAL HIGH (ref 70–99)
Potassium: 4.1 mmol/L (ref 3.5–5.1)
Sodium: 139 mmol/L (ref 135–145)
Total Bilirubin: 0.7 mg/dL (ref 0.3–1.2)
Total Protein: 7.5 g/dL (ref 6.5–8.1)

## 2021-12-26 LAB — CBC WITH DIFFERENTIAL/PLATELET
Abs Immature Granulocytes: 0.01 10*3/uL (ref 0.00–0.07)
Basophils Absolute: 0.1 10*3/uL (ref 0.0–0.1)
Basophils Relative: 1 %
Eosinophils Absolute: 0.6 10*3/uL — ABNORMAL HIGH (ref 0.0–0.5)
Eosinophils Relative: 9 %
HCT: 42.9 % (ref 39.0–52.0)
Hemoglobin: 14.3 g/dL (ref 13.0–17.0)
Immature Granulocytes: 0 %
Lymphocytes Relative: 32 %
Lymphs Abs: 2.1 10*3/uL (ref 0.7–4.0)
MCH: 29.4 pg (ref 26.0–34.0)
MCHC: 33.3 g/dL (ref 30.0–36.0)
MCV: 88.3 fL (ref 80.0–100.0)
Monocytes Absolute: 0.5 10*3/uL (ref 0.1–1.0)
Monocytes Relative: 7 %
Neutro Abs: 3.2 10*3/uL (ref 1.7–7.7)
Neutrophils Relative %: 51 %
Platelets: 286 10*3/uL (ref 150–400)
RBC: 4.86 MIL/uL (ref 4.22–5.81)
RDW: 12.7 % (ref 11.5–15.5)
WBC: 6.4 10*3/uL (ref 4.0–10.5)
nRBC: 0 % (ref 0.0–0.2)

## 2021-12-26 LAB — RESP PANEL BY RT-PCR (FLU A&B, COVID) ARPGX2
Influenza A by PCR: NEGATIVE
Influenza B by PCR: NEGATIVE
SARS Coronavirus 2 by RT PCR: NEGATIVE

## 2021-12-26 LAB — TROPONIN I (HIGH SENSITIVITY): Troponin I (High Sensitivity): 4 ng/L (ref <18)

## 2021-12-26 LAB — BRAIN NATRIURETIC PEPTIDE: B Natriuretic Peptide: 133.4 pg/mL — ABNORMAL HIGH (ref 0.0–100.0)

## 2021-12-26 MED ORDER — ALBUTEROL SULFATE HFA 108 (90 BASE) MCG/ACT IN AERS
2.0000 | INHALATION_SPRAY | Freq: Once | RESPIRATORY_TRACT | Status: AC
  Administered 2021-12-26: 2 via RESPIRATORY_TRACT
  Filled 2021-12-26: qty 6.7

## 2021-12-26 MED ORDER — ALBUTEROL SULFATE (2.5 MG/3ML) 0.083% IN NEBU: 2.5000 mg | INHALATION_SOLUTION | RESPIRATORY_TRACT | Status: DC | PRN

## 2021-12-26 MED ORDER — GABAPENTIN 300 MG PO CAPS: ORAL_CAPSULE | Freq: Three times a day (TID) | ORAL | 0 refills | Status: DC

## 2021-12-26 MED ORDER — IPRATROPIUM-ALBUTEROL 0.5-2.5 (3) MG/3ML IN SOLN
3.0000 mL | Freq: Once | RESPIRATORY_TRACT | Status: AC
  Administered 2021-12-26: 3 mL via RESPIRATORY_TRACT
  Filled 2021-12-26: qty 3

## 2021-12-26 NOTE — ED Provider Notes (Signed)
[facility] EMERGENCY DEPARTMENT Provider Note   CSN: [id number] Arrival date & time: 12/26/21  1808     History  Chief Complaint  Patient presents with   Shortness of Breath   Cough    Edward Weaver is a 66 y.o. male.  HPI 66 year old male with a history of Afib presents with shortness of breath and cough.  He has been having chest congestion, cough, shortness of breath for about 4 days.  He has had some wheezing.  He used Mucinex with no relief.  He had subjective fevers but has not measured it.  Occasionally he gets chest pain which seems to be more positionally related.  He has chronic leg swelling but is unchanged from baseline.  Used significant other's albuterol inhaler with some transient relief.  He denies any previous history of smoking or COPD.  Home Medications Prior to Admission medications   Medication Sig Start Date End Date Taking? Authorizing Provider  aspirin EC 81 MG tablet Take 1 tablet (81 mg total) by mouth daily. Swallow whole. 01/14/21   Tolia, Sunit, DO  atorvastatin (LIPITOR) 40 MG tablet TAKE 1 TABLET (40 MG TOTAL) BY MOUTH AT BEDTIME. 11/18/21 02/16/22  Tolia, Sunit, DO  gabapentin (NEURONTIN) 300 MG capsule TAKE 1 CAPSULE (300 MG TOTAL) BY MOUTH 3 (THREE) TIMES DAILY. 12/26/21   Pricilla Loveless, MD  metoprolol succinate (TOPROL-XL) 100 MG 24 hr tablet TAKE 1 TABLET BY MOUTH ONCE DAILY AT BEDTIME FOR ATRIAL FIBRILLATION 10/14/21   Tolia, Sunit, DO  rivaroxaban (XARELTO) 20 MG TABS tablet Take 1 tablet (20 mg total) by mouth daily. 11/24/21   Tolia, Sunit, DO  sacubitril-valsartan (ENTRESTO) 97-103 MG Take 1 tablet by mouth 2 (two) times daily. 09/30/21   Tolia, Sunit, DO      Allergies    Penicillins    Review of Systems   Review of Systems  Constitutional:  Positive for fever.  HENT:  Positive for congestion.   Respiratory:  Positive for cough, shortness of breath and wheezing.   Cardiovascular:  Positive for chest pain and leg swelling.     Physical Exam Updated Vital Signs BP 102/74   Pulse 60   Temp 98.1 F (36.7 C) (Oral)   Resp 18   Ht 5\' 6"  (1.676 m)   Wt 127 kg   SpO2 97%   BMI 45.19 kg/m  Physical Exam Vitals and nursing note reviewed.  Constitutional:      General: He is not in acute distress.    Appearance: He is well-developed. He is obese. He is not ill-appearing or diaphoretic.  HENT:     Head: Normocephalic and atraumatic.  Cardiovascular:     Rate and Rhythm: Normal rate. Rhythm irregular.     Heart sounds: Normal heart sounds.  Pulmonary:     Effort: Pulmonary effort is normal.     Breath sounds: Wheezing (diffuse, expiratory) present.  Abdominal:     Palpations: Abdomen is soft.     Tenderness: There is no abdominal tenderness.  Musculoskeletal:     Right lower leg: Edema present.     Left lower leg: Edema present.     Comments: Mild pedal/lower leg edema  Skin:    General: Skin is warm and dry.  Neurological:     Mental Status: He is alert.     ED Results / Procedures / Treatments   Labs (all labs ordered are listed, but only abnormal results are displayed) Labs Reviewed  CBC WITH  DIFFERENTIAL/PLATELET - Abnormal; Notable for the following components:      Result Value   Eosinophils Absolute 0.6 (*)    All other components within normal limits  COMPREHENSIVE METABOLIC PANEL - Abnormal; Notable for the following components:   Glucose, Bld 108 (*)    All other components within normal limits  BRAIN NATRIURETIC PEPTIDE - Abnormal; Notable for the following components:   B Natriuretic Peptide 133.4 (*)    All other components within normal limits  RESP PANEL BY RT-PCR (FLU A&B, COVID) ARPGX2  TROPONIN I (HIGH SENSITIVITY)    EKG EKG Interpretation  Date/Time:  Friday December 26 2021 18:29:18 EDT Ventricular Rate:  73 PR Interval:    QRS Duration: 112 QT Interval:  404 QTC Calculation: 446 R Axis:   66 Text Interpretation: Atrial fibrillation Borderline  intraventricular conduction delay similar to 2022 Confirmed by Sherwood Gambler [phone]) on 12/26/2021 6:41:52 PM  Radiology DG Chest 2 View  Result Date: 12/26/2021 CLINICAL DATA:  Shortness of breath EXAM: CHEST - 2 VIEW COMPARISON:  12/31/2020 FINDINGS: The heart size and mediastinal contours are within normal limits. Both lungs are clear. The visualized skeletal structures are unremarkable. IMPRESSION: No active cardiopulmonary disease. Electronically Signed   By: Elmer Picker M.D.   On: 12/26/2021 19:02    Procedures Procedures    Medications Ordered in ED Medications  ipratropium-albuterol (DUONEB) 0.5-2.5 (3) MG/3ML nebulizer solution 3 mL (3 mLs Nebulization Given 12/26/21 1929)  albuterol (VENTOLIN HFA) 108 (90 Base) MCG/ACT inhaler 2 puff (2 puffs Inhalation Given 12/26/21 2029)    ED Course/ Medical Decision Making/ A&P                           Medical Decision Making Amount and/or Complexity of Data Reviewed External Data Reviewed: notes. Labs: ordered.    Details: Normal WBC, normal troponin, no significant electrolyte disturbance.  Slight elevation in BNP.  COVID/flu negative. Radiology: ordered and independent interpretation performed.    Details: No pneumonia ECG/medicine tests: independent interpretation performed.    Details: Chronic A-fib.  Risk Prescription drug management.   Patient appears to have acute bronchitis.  He was given albuterol neb with ipratropium and now feels a lot better.  He is actually able to produce sputum which she has not been able to.  Wheezing has cleared up on reassessment.  Discussed this is most likely a viral process.  I doubt acute CHF based on no pulmonary edema and resolution with DuoNeb.  No antibiotics indicated at this time.  Vital signs are stable.  He appears stable for discharge, doubt ACS, PE, bacterial pneumonia, etc.  Will discharge home with return precautions.  He was given an albuterol inhaler to take home.  Of  note he states he no longer has a PCP but is due to see someone in 5 days.  He is asking for refill of his gabapentin which ran out of a couple days ago.        Final Clinical Impression(s) / ED Diagnoses Final diagnoses:  Acute bronchitis, unspecified organism    Rx / DC Orders ED Discharge Orders          Ordered    gabapentin (NEURONTIN) 300 MG capsule  3 times daily        12/26/21 2011              Sherwood Gambler, MD 12/26/21 2137

## 2021-12-26 NOTE — ED Triage Notes (Addendum)
Reports cough congestion and phlegm he just can't get up Reports has been taking mucinex with  no relief. Reports having night sweats at night. Also reports chest pain and back pain due to coughing.  Patient reports this morning was coughing and got choked.

## 2021-12-26 NOTE — ED Notes (Signed)
Shortness of breath x4-5 day. States that he gets shortness of breast with ambulation and at rest

## 2021-12-26 NOTE — Discharge Instructions (Addendum)
Use the albuterol inhaler 1-2 puffs every 4 hours as needed for cough, shortness of breath, wheezing.  If you develop coughing up blood, chest pain, new or worsening shortness of breath, or any other new/concerning symptoms and return to the ER for evaluation.

## 2021-12-29 ENCOUNTER — Telehealth: Payer: Self-pay | Admitting: Licensed Clinical Social Worker

## 2021-12-29 NOTE — Patient Outreach (Signed)
  Care Coordination   12/29/2021 Name: Edward Weaver MRN: [id number] DOB: 15-Apr-1955   Care Coordination Outreach Attempts:  An unsuccessful telephone outreach was attempted today to offer the patient information about available care coordination services as a benefit of their health plan.   Follow Up Plan:  Additional outreach attempts will be made to offer the patient care coordination information and services.   Encounter Outcome:  No Answer  Care Coordination Interventions Activated:  No   Care Coordination Interventions:  No, not indicated    Christa See, MSW, [email or web address] Phone [phone] 3:40 PM

## 2021-12-30 DIAGNOSIS — R7303 Prediabetes: Secondary | ICD-10-CM | POA: Diagnosis not present

## 2021-12-30 DIAGNOSIS — E7800 Pure hypercholesterolemia, unspecified: Secondary | ICD-10-CM | POA: Diagnosis not present

## 2021-12-30 DIAGNOSIS — I5022 Chronic systolic (congestive) heart failure: Secondary | ICD-10-CM | POA: Diagnosis not present

## 2021-12-30 DIAGNOSIS — I4891 Unspecified atrial fibrillation: Secondary | ICD-10-CM | POA: Diagnosis not present

## 2022-01-01 ENCOUNTER — Telehealth: Payer: Self-pay | Admitting: Licensed Clinical Social Worker

## 2022-01-02 NOTE — Patient Instructions (Signed)
Visit Information  Thank you for taking time to visit with me today. Please don't hesitate to contact me if I can be of assistance to you.   Following are the goals we discussed today:   Goals Addressed             This Visit's Progress    COMPLETED: Care Coordination Activities-No Follow Up Required       Care Coordination Interventions: Active listening / Reflection utilized  Emotional Support Provided Patient reports that he is "feeling better" since ED visit. He continues to use an inhaler at home Pt states he does not visit [place]. Could not recall name of PCP; however, states, they have sent in antibiotics for him Pt denies anxiety symptoms and/or MH resources/needs LCSW informed patient of care coordination services. Pt is not interested at this time and agreed to contact PCP, should needs arise          If you are experiencing a Mental Health or [place] or need someone to talk to, please call the Suicide and Crisis Lifeline: 988 call 911   The patient verbalized understanding of instructions, educational materials, and care plan provided today and DECLINED offer to receive copy of patient instructions, educational materials, and care plan.   No further follow up required:    Christa See, MSW, [email or web address] Phone [phone] 4:06 PM

## 2022-01-02 NOTE — Patient Outreach (Signed)
  Care Coordination   Initial Visit Note   01/02/2022 Name: Edward Weaver MRN: [id number] DOB: 1955/11/10  Edward Weaver is a 66 y.o. year old male who sees Pcp, No for primary care. I spoke with  [name] by phone today.  What matters to the patients health and wellness today?  Care Coordination Services    Goals Addressed             This Visit's Progress    COMPLETED: Care Coordination Activities-No Follow Up Required       Care Coordination Interventions: Active listening / Reflection utilized  Emotional Support Provided Patient reports that he is "feeling better" since ED visit. He continues to use an inhaler at home Pt states he does not visit [place]. Could not recall name of PCP; however, states, they have sent in antibiotics for him Pt denies anxiety symptoms and/or MH resources/needs LCSW informed patient of care coordination services. Pt is not interested at this time and agreed to contact PCP, should needs arise          SDOH assessments and interventions completed:  No     Care Coordination Interventions Activated:  Yes  Care Coordination Interventions:  Yes, provided   Follow up plan: No further intervention required.   Encounter Outcome:  Pt. Refused   Christa See, MSW, [email or web address] Phone [phone] 4:05 PM

## 2022-01-17 ENCOUNTER — Other Ambulatory Visit: Payer: Self-pay | Admitting: Cardiology

## 2022-01-17 DIAGNOSIS — E7800 Pure hypercholesterolemia, unspecified: Secondary | ICD-10-CM

## 2022-01-19 ENCOUNTER — Telehealth: Payer: Self-pay

## 2022-01-19 ENCOUNTER — Other Ambulatory Visit: Payer: Self-pay | Admitting: Cardiology

## 2022-01-19 DIAGNOSIS — E7800 Pure hypercholesterolemia, unspecified: Secondary | ICD-10-CM

## 2022-01-19 NOTE — Telephone Encounter (Signed)
Have him come in to see [place]; however, if his chest pain is getting worse he needs to go to the closest ER via EMS.  Also have him keep a log of his blood pressures and bring it in at the next office visit and daily weights.  Jonee Lamore [place], DO, [facility]

## 2022-01-19 NOTE — Telephone Encounter (Signed)
Tried calling patient no answer left a vm to call back

## 2022-01-23 ENCOUNTER — Ambulatory Visit: Payer: Medicare Other

## 2022-02-02 ENCOUNTER — Ambulatory Visit: Payer: Medicare Other | Admitting: Cardiology

## 2022-02-02 ENCOUNTER — Ambulatory Visit: Payer: Medicare Other | Admitting: Internal Medicine

## 2022-02-02 DIAGNOSIS — R051 Acute cough: Secondary | ICD-10-CM | POA: Insufficient documentation

## 2022-02-02 DIAGNOSIS — U071 COVID-19: Secondary | ICD-10-CM | POA: Insufficient documentation

## 2022-02-02 LAB — POC COVID19 BINAXNOW

## 2022-02-02 MED ORDER — PAXLOVID (300/100) 20 X 150 MG & 10 X 100MG PO TBPK: 3.0000 | ORAL_TABLET | Freq: Two times a day (BID) | ORAL | 0 refills | Status: DC

## 2022-02-02 NOTE — Progress Notes (Unsigned)
   Acute Office Visit  Subjective:     Patient ID: Edward Weaver, male    DOB: 1955-12-08, 66 y.o.   MRN: [id number]  Chief Complaint  Patient presents with   Cough    HPI Patient is in today for ***  ROS      Objective:    There were no vitals taken for this visit. {Vitals History (Optional):23777}  Physical Exam  No results found for any visits on 02/02/22.      Assessment & Plan:   Problem List Items Addressed This Visit   None Visit Diagnoses     Acute cough    -  Primary       No orders of the defined types were placed in this encounter.   No follow-ups on file.  Eloisa Northern, MD

## 2022-02-04 ENCOUNTER — Other Ambulatory Visit: Payer: Self-pay

## 2022-02-04 MED ORDER — MOLNUPIRAVIR EUA 200MG CAPSULE: 4.0000 | ORAL_CAPSULE | Freq: Two times a day (BID) | ORAL | 0 refills | Status: AC

## 2022-02-12 ENCOUNTER — Ambulatory Visit: Payer: Medicare Other | Admitting: Cardiology

## 2022-02-17 ENCOUNTER — Encounter: Payer: Self-pay | Admitting: Cardiology

## 2022-02-17 ENCOUNTER — Ambulatory Visit: Payer: Medicare Other | Admitting: Cardiology

## 2022-02-17 VITALS — BP 118/74 | HR 52 | Resp 18 | Ht 66.0 in | Wt 282.8 lb

## 2022-02-17 DIAGNOSIS — G459 Transient cerebral ischemic attack, unspecified: Secondary | ICD-10-CM

## 2022-02-17 DIAGNOSIS — I5032 Chronic diastolic (congestive) heart failure: Secondary | ICD-10-CM

## 2022-02-17 DIAGNOSIS — I4819 Other persistent atrial fibrillation: Secondary | ICD-10-CM

## 2022-02-17 DIAGNOSIS — E7800 Pure hypercholesterolemia, unspecified: Secondary | ICD-10-CM | POA: Diagnosis not present

## 2022-02-17 DIAGNOSIS — Z7901 Long term (current) use of anticoagulants: Secondary | ICD-10-CM

## 2022-02-17 DIAGNOSIS — I6523 Occlusion and stenosis of bilateral carotid arteries: Secondary | ICD-10-CM

## 2022-02-17 DIAGNOSIS — G4733 Obstructive sleep apnea (adult) (pediatric): Secondary | ICD-10-CM

## 2022-02-17 DIAGNOSIS — I10 Essential (primary) hypertension: Secondary | ICD-10-CM

## 2022-02-17 MED ORDER — FUROSEMIDE 20 MG PO TABS: 20.0000 mg | ORAL_TABLET | Freq: Every morning | ORAL | 0 refills | Status: DC

## 2022-02-17 MED ORDER — RIVAROXABAN 20 MG PO TABS: 20.0000 mg | ORAL_TABLET | Freq: Every day | ORAL | 0 refills | Status: DC

## 2022-02-17 NOTE — Progress Notes (Signed)
Date:  02/17/2022   ID:  Edward Weaver, DOB 10/03/1955, MRN [id number]  PCP:  Townsend Roger, MD  Cardiologist:  Rex Kras, DO, [facility] (established care 08/13/2020)   Chief Complaint  Patient presents with   Atrial Fibrillation   Follow-up    6 month- A-Fib and HF Management    HPI  Edward Weaver is a 66 y.o. male whose past medical history and cardiovascular risk factors include: Chronic HFpEF, hx of COVID, OSA not CPAP, Atrial Fibrillation, TIA, Hypertension, Hyperlipidemia, bilateral carotid artery stenosis, obesity due to excess calories.  Patient is accompanied by his wife Edward Weaver at today's office visit.  Patient is being followed by the practice for management of atrial fibrillation, mildly reduced LVEF, and carotid disease.  With regards to atrial fibrillation management he prefers to be treated conservatively with AV nodal blocking agents for rate control strategy and anticoagulation for stroke prophylaxis.  In the past has refused antiarrhythmic medications or being considered for ablation/cardioversion.  In the past he was noted to have mildly reduced LVEF and given structural changes noted on his echo he was started on GDMT.  With some up titration of GDMT patient's LVEF had improved to 50-55% as per his last echo.  Uptitration of medical therapy has been difficult due to noncompliance and no-shows at multiple appointments.  He also has some bilateral asymptomatic carotid stenosis which is currently being treated medically and w/surveillance duplex.  He presents today for 69-monthfollow-up visit.  He denies angina pectoris; however, continues to have shortness of breath with effort related activities, orthopnea, PND.  At the last office visit he was encouraged to be on Farxiga but for reasons unknown is not on it.  He also stopped taking Lasix.  He is doing well on rate control strategy for atrial fibrillation and on oral anticoagulation for thromboembolic prophylaxis.   He is requesting samples for Xarelto he is currently in the donut hole.  Unfortunately, patient has not been working on lifestyle changes with regards to weight loss, glycemic control, improving lipids, noncompliant with CPAP, etc.  FUNCTIONAL STATUS: No structured exercise program or daily routine.   ALLERGIES: Allergies  Allergen Reactions   Penicillins Other (See Comments)    Syncope, hypotension    MEDICATION LIST PRIOR TO VISIT: Current Meds  Medication Sig   atorvastatin (LIPITOR) 40 MG tablet TAKE 1 TABLET (40 MG TOTAL) BY MOUTH AT BEDTIME.   furosemide (LASIX) 20 MG tablet Take 1 tablet (20 mg total) by mouth every morning.   gabapentin (NEURONTIN) 300 MG capsule TAKE 1 CAPSULE (300 MG TOTAL) BY MOUTH 3 (THREE) TIMES DAILY.   metoprolol succinate (TOPROL-XL) 100 MG 24 hr tablet TAKE 1 TABLET BY MOUTH ONCE DAILY AT BEDTIME FOR ATRIAL FIBRILLATION   sacubitril-valsartan (ENTRESTO) 97-103 MG Take 1 tablet by mouth 2 (two) times daily.   [DISCONTINUED] rivaroxaban (XARELTO) 20 MG TABS tablet Take 20 mg by mouth daily with supper.     PAST MEDICAL HISTORY: Past Medical History:  Diagnosis Date   Arthritis    [place]    Bell's palsy    Carotid artery stenosis, asymptomatic, bilateral    Edema    Hiatal hernia    Hyperlipidemia    Hypertension    Stroke ([facility]    TIA (transient ischemic attack)    Nov and/or Dec 2014    PAST SURGICAL HISTORY: Past Surgical History:  Procedure Laterality Date   ESOPHAGUS SURGERY     KNEE SURGERY  Right    1974   TONSILLECTOMY     WISDOM TOOTH EXTRACTION      FAMILY HISTORY: The patient family history includes Alzheimer's disease in his father and paternal aunt; Cancer - Other in his mother; Dementia in his paternal aunt; Healthy in his brother and son; Heart disease in his mother; Hypertension in his father and mother; Stroke in his paternal aunt; Stroke (age of onset: 69) in his brother.  SOCIAL HISTORY:  The  patient  reports that he has never smoked. He has never used smokeless tobacco. He reports that he does not drink alcohol and does not use drugs.  REVIEW OF SYSTEMS: Review of Systems  Constitutional: Negative for chills and fever.  HENT:  Negative for hoarse voice and nosebleeds.   Eyes:  Negative for discharge, double vision and pain.  Cardiovascular:  Positive for dyspnea on exertion (chronic and stable.), orthopnea and paroxysmal nocturnal dyspnea. Negative for chest pain, claudication, leg swelling, near-syncope, palpitations and syncope.  Respiratory:  Positive for shortness of breath and snoring. Negative for hemoptysis.   Musculoskeletal:  Negative for muscle cramps and myalgias.  Gastrointestinal:  Negative for abdominal pain, constipation, diarrhea, hematemesis, hematochezia, melena, nausea and vomiting.  Neurological:  Negative for dizziness and light-headedness.   PHYSICAL EXAM:    02/17/2022    1:53 PM 12/26/2021    8:00 PM 12/26/2021    7:40 PM  Vitals with BMI  Height _0     Weight 282 lbs 13 oz    BMI 01.60    Systolic 109 323 557  Diastolic 74 74 84  Pulse 52 60 89    CONSTITUTIONAL: Appears older than stated age, hemodynamically stable, no acute distress.    SKIN: Skin is warm and dry. No rash noted. No cyanosis. No pallor. No jaundice HEAD: Normocephalic and atraumatic.  EYES: No scleral icterus MOUTH/THROAT: Moist oral membranes.  NECK: Unable to evaluate JVP due to a short neck stature, no JVD present. No thyromegaly noted. No carotid bruits  CHEST Normal respiratory effort. No intercostal retractions  LUNGS: Clear to auscultation bilaterally with decreased breath sounds at the bases.  No stridor. No wheezes. No rales.  CARDIOVASCULAR: Irregularly irregular, variable S1-S2, no murmurs rubs or gallops appreciated. ABDOMINAL: Obese, soft, nontender, nondistended, positive bowel sounds in all 4 quadrants, no abdominal bruits appreciated.  No apparent ascites.   EXTREMITIES: +1 pitting edema bilaterally, +2 dorsalis pedis and posterior tibial pulses, poor nail hygiene.   HEMATOLOGIC: No significant bruising NEUROLOGIC: Oriented to person, place, and time. Nonfocal. Normal muscle tone.  PSYCHIATRIC: Normal mood and affect. Normal behavior. Cooperative  CARDIAC DATABASE: EKG: 02/17/2022: Atrial fibrillation, rate controlled, 76 bpm, poor R wave progression, low voltage, without underlying injury pattern.  Echocardiogram: 01/11/2017: LVEF 50-55%  08/22/2020: LVEF 45-50%, moderate LVH, elevated LAP, moderately dilated left atrium, mild MR, mild TR.  07/23/2021:  Left ventricle cavity is normal in size. Moderate concentric hypertrophy of the left ventricle. Normal LV systolic function with visual EF 50-55%.  Normal global wall motion. Unable to evaluate diastolic function due to atrial fibrillation.  Left atrial cavity is mildly dilated.  Mild to moderate mitral regurgitation.  Mild to moderate tricuspid regurgitation.  No evidence of pulmonary hypertension.  No significant change compared to previous study on 08/22/2020.  Stress Testing: Lexiscan Tetrofosmin stress test 08/28/2020: Stress EKG is non-diagnostic, as this is pharmacological stress test using Lexiscan. Stress EKG is non-diagnostic, as this is pharmacological stress test. Rest and stress EKG showed atrial  fibrillation, no ST-T abnormality. SPECT images show normal stress myocardial perfusion. Decreased tracer uptake in inferior myocardium at rest likely due diaphragmatic attenuation. Stress LVEF calculated 48%, but visually appears normal. Low risk study.  Heart Catheterization: None   Carotid Duplex: Carotid artery duplex 07/11/2021:  Duplex suggests stenosis in the right internal carotid artery (50-69%).  Duplex suggests stenosis in the left internal carotid artery (16-49%).  There is diffuse homogeneous plaque in the bilateral carotid arteries.  Antegrade right vertebral artery  flow. Antegrade left vertebral artery flow.  No significant change since 08/22/2020. Follow up in six months is appropriate if clinically indicated.  LABORATORY DATA:    Latest Ref Rng & Units 12/26/2021    7:13 PM 12/31/2020    6:50 PM 04/24/2019    9:42 AM  CBC  WBC 4.0 - 10.5 K/uL 6.4  8.5  6.2   Hemoglobin 13.0 - 17.0 g/dL 14.3  14.4  14.9   Hematocrit 39.0 - 52.0 % 42.9  43.6  44.4   Platelets 150 - 400 K/uL 286  305  317        Latest Ref Rng & Units 12/26/2021    7:13 PM 07/23/2021    9:00 AM 12/31/2020    6:50 PM  CMP  Glucose 70 - 99 mg/dL 108  106  141   BUN 8 - 23 mg/dL _0 Creatinine 0.61 - 1.24 mg/dL 1.01  1.17  1.14   Sodium 135 - 145 mmol/L 139  139  135   Potassium 3.5 - 5.1 mmol/L 4.1  4.9  3.9   Chloride 98 - 111 mmol/L 105  106  101   CO2 22 - 32 mmol/L _1 Calcium 8.9 - 10.3 mg/dL 9.3  9.4  9.0   Total Protein 6.5 - 8.1 g/dL 7.5   7.1   Total Bilirubin 0.3 - 1.2 mg/dL 0.7   0.8   Alkaline Phos 38 - 126 U/L 65   54   AST 15 - 41 U/L 15   16   ALT 0 - 44 U/L 28   27     Lipid Panel  Lab Results  Component Value Date   CHOL 225 (H) 09/10/2020   HDL 46 09/10/2020   LDLCALC 141 (H) 09/10/2020   LDLDIRECT 149 (H) 09/10/2020   TRIG 211 (H) 09/10/2020   CHOLHDL 5.5 01/11/2017    No components found for: "NTPROBNP" No results for input(s): "PROBNP" in the last 8760 hours.  No results for input(s): "TSH" in the last 8760 hours.  BMP Recent Labs    07/23/21 0900 12/26/21 1913  NA 139 139  K 4.9 4.1  CL 106 105  CO2 25 26  GLUCOSE 106* 108*  BUN 17 15  CREATININE 1.17 1.01  CALCIUM 9.4 9.3  GFRNONAA  --  >60    HEMOGLOBIN A1C Lab Results  Component Value Date   HGBA1C 5.9 (A) 05/19/2018   MPG 125.5 01/10/2017   External Labs: Collected: 08/06/2020 provided by PCP Creatinine 1 mg/dL. eGFR: 79 mL/min per 1.73 m Lipid profile: Total cholesterol 218, triglycerides 269, HDL 45, LDL 132, non HDL 173 Hemoglobin A1c:  6 TSH: 2.06  IMPRESSION:    ICD-10-CM   1. Persistent atrial fibrillation (HCC)  I48.19 EKG 12-Lead    rivaroxaban (XARELTO) 20 MG TABS tablet    2. Long term (current) use of anticoagulants  Z79.01 rivaroxaban (XARELTO) 20 MG TABS tablet  Hemoglobin and hematocrit, blood    3. Bilateral carotid artery stenosis  I65.23 Lipid Panel With LDL/HDL Ratio    LDL cholesterol, direct    CMP14+EGFR    PCV CAROTID DUPLEX (BILATERAL)    4. Hypercholesterolemia  E78.00 Lipid Panel With LDL/HDL Ratio    LDL cholesterol, direct    CMP14+EGFR    5. Chronic heart failure with preserved ejection fraction (HFpEF) (HCC)  I50.32 furosemide (LASIX) 20 MG tablet    Lipid Panel With LDL/HDL Ratio    LDL cholesterol, direct    CMP14+EGFR    Pro b natriuretic peptide (BNP)    6. OSA (obstructive sleep apnea)  G47.33     7. Benign hypertension  I10     8. TIA (transient ischemic attack)  G45.9     9. Class 3 severe obesity due to excess calories with serious comorbidity and body mass index (BMI) of 45.0 to 49.9 in adult [facility])  E66.01    Z68.42        RECOMMENDATIONS: DAYLAN JUHNKE is a 66 y.o. male whose past medical history and cardiac risk factors include: Chronic HFpEF, OSA not CPAP, Atrial Fibrillation, TIA, Hypertension, Hyperlipidemia, bilateral carotid artery stenosis, obesity due to excess calories.  Persistent atrial fibrillation ([place]) Diagnosed in 2019, per patient. Rate control: Toprol-XL. Rhythm control: N/A. Thromboembolic prophylaxis: Xarelto. I have discussed with him and his wife with regards to restoring normal sinus rhythm.  However patient is not interested in being on antiarrhythmic medications or to consider options such as cardioversion or ablation.  Long term (current) use of anticoagulants Indication: Persistent atrial fibrillation. Remains on Xarelto. Does not endorse evidence of bleeding. Will check H&H.  Bilateral carotid artery stenosis Asymptomatic. No  neurological deficits. Continue current pharmacological therapy. Will repeat carotid duplex as a 1 year follow-up. Patient is asked to seek medical attention by going to the closest ER via EMS if he notices any focal neurological deficits, expressive or receptive aphasia, or vision changes as these may indicate a be TIA/CVA.  Hypercholesterolemia Started on atorvastatin 40 mg p.o. nightly in the past. Will repeat fasting lipid profile and CMP to reevaluate therapy and liver function.  Chronic heart failure with preserved ejection fraction (HFpEF) (HCC) Stage B, NYHA class II/III. At the last office visit recommended discontinuation of Lasix so that he could initiate [place]. Patient chose not to start [place] for reasons unknown and also did not restart Lasix as an alternative for volume management. Will send in prescription for Lasix 20 mg p.o. every morning. Labs in 1 week to evaluate kidney function and electrolytes. We will uptitrate GDMT as hemodynamics and laboratory values allow. Educated on the importance of compliance Strict I's and O's and daily weights.  OSA (obstructive sleep apnea) Chooses not to be compliant with CPAP. Reemphasized the importance of CPAP given his other comorbid conditions.  Patient states that he will discuss with his provider with regards to getting a nasal appliance opposed to a mouthpiece.  Benign hypertension Office blood pressures are within acceptable limits. Medications reconciled. Reemphasized importance of a low-salt diet  TIA (transient ischemic attack) Education importance of secondary prevention  Class 3 severe obesity due to excess calories with serious comorbidity and body mass index (BMI) of 45.0 to 49.9 in adult [facility]) Body mass index is 45.65 kg/m. I reviewed with the patient the importance of diet, regular physical activity/exercise, weight loss.   Patient is educated on increasing physical activity gradually as tolerated.  With the  goal  of moderate intensity exercise for 30 minutes a day 5 days a week.  FINAL MEDICATION LIST END OF ENCOUNTER: Meds ordered this encounter  Medications   rivaroxaban (XARELTO) 20 MG TABS tablet    Sig: Take 1 tablet (20 mg total) by mouth daily with supper.    Dispense:  90 tablet    Refill:  0   furosemide (LASIX) 20 MG tablet    Sig: Take 1 tablet (20 mg total) by mouth every morning.    Dispense:  90 tablet    Refill:  0     Medications Discontinued During This Encounter  Medication Reason   aspirin EC 81 MG tablet    rivaroxaban (XARELTO) 20 MG TABS tablet Reorder     Current Outpatient Medications:    atorvastatin (LIPITOR) 40 MG tablet, TAKE 1 TABLET (40 MG TOTAL) BY MOUTH AT BEDTIME., Disp: 90 tablet, Rfl: 0   furosemide (LASIX) 20 MG tablet, Take 1 tablet (20 mg total) by mouth every morning., Disp: 90 tablet, Rfl: 0   gabapentin (NEURONTIN) 300 MG capsule, TAKE 1 CAPSULE (300 MG TOTAL) BY MOUTH 3 (THREE) TIMES DAILY., Disp: 21 capsule, Rfl: 0   metoprolol succinate (TOPROL-XL) 100 MG 24 hr tablet, TAKE 1 TABLET BY MOUTH ONCE DAILY AT BEDTIME FOR ATRIAL FIBRILLATION, Disp: 30 tablet, Rfl: 6   sacubitril-valsartan (ENTRESTO) 97-103 MG, Take 1 tablet by mouth 2 (two) times daily., Disp: 180 tablet, Rfl: 3   rivaroxaban (XARELTO) 20 MG TABS tablet, Take 1 tablet (20 mg total) by mouth daily with supper., Disp: 90 tablet, Rfl: 0  Orders Placed This Encounter  Procedures   Lipid Panel With LDL/HDL Ratio   LDL cholesterol, direct   CMP14+EGFR   Pro b natriuretic peptide (BNP)   Hemoglobin and hematocrit, blood   EKG 12-Lead   PCV CAROTID DUPLEX (BILATERAL)    There are no Patient Instructions on file for this visit.   --Continue cardiac medications as reconciled in final medication list. --No follow-ups on file. Or sooner if needed. --Continue follow-up with your primary care physician regarding the management of your other chronic comorbid conditions.  Patient's  questions and concerns were addressed to his satisfaction. He voices understanding of the instructions provided during this encounter.   This note was created using a voice recognition software as a result there may be grammatical errors inadvertently enclosed that do not reflect the nature of this encounter. Every attempt is made to correct such errors.  Total time spent: 45 minutes.  As part of this visit reviewed ED records from 12/31/2020 which included note, EKG, labs these findings have been summarized and noted above for further reference.  Discussed disease management, ordering diagnostic testing, coordination of care and patient education provided as a part of today's encounter.   Rex Kras, [place], [facility]  Pager: [phone] Office: [phone]

## 2022-04-02 ENCOUNTER — Other Ambulatory Visit: Payer: Self-pay

## 2022-04-02 DIAGNOSIS — Z7901 Long term (current) use of anticoagulants: Secondary | ICD-10-CM

## 2022-04-02 DIAGNOSIS — I4819 Other persistent atrial fibrillation: Secondary | ICD-10-CM

## 2022-04-02 MED ORDER — RIVAROXABAN 20 MG PO TABS: 20.0000 mg | ORAL_TABLET | Freq: Every day | ORAL | 3 refills | Status: DC

## 2022-04-03 ENCOUNTER — Other Ambulatory Visit: Payer: Self-pay

## 2022-04-03 DIAGNOSIS — I10 Essential (primary) hypertension: Secondary | ICD-10-CM

## 2022-04-03 DIAGNOSIS — I4819 Other persistent atrial fibrillation: Secondary | ICD-10-CM

## 2022-04-03 DIAGNOSIS — E7800 Pure hypercholesterolemia, unspecified: Secondary | ICD-10-CM

## 2022-04-09 DIAGNOSIS — E7800 Pure hypercholesterolemia, unspecified: Secondary | ICD-10-CM | POA: Diagnosis not present

## 2022-04-09 DIAGNOSIS — I4819 Other persistent atrial fibrillation: Secondary | ICD-10-CM | POA: Diagnosis not present

## 2022-04-09 DIAGNOSIS — I10 Essential (primary) hypertension: Secondary | ICD-10-CM | POA: Diagnosis not present

## 2022-04-10 LAB — LIPID PANEL
Cholesterol: 142 mg/dL (ref <200)
HDL: 46 mg/dL (ref >=40)
LDL Cholesterol (Calc): 71 mg/dL (calc)
Non-HDL Cholesterol (Calc): 96 mg/dL (calc) (ref <130)
Total CHOL/HDL Ratio: 3.1 (calc) (ref <5.0)
Triglycerides: 170 mg/dL — ABNORMAL HIGH (ref <150)

## 2022-04-10 LAB — COMPLETE METABOLIC PANEL WITH GFR
AG Ratio: 1.5 (calc) (ref 1.0–2.5)
ALT: 22 U/L (ref 9–46)
AST: 11 U/L (ref 10–35)
Albumin: 4 g/dL (ref 3.6–5.1)
Alkaline phosphatase (APISO): 61 U/L (ref 35–144)
BUN: 18 mg/dL (ref 7–25)
CO2: 26 mmol/L (ref 20–32)
Calcium: 9.2 mg/dL (ref 8.6–10.3)
Chloride: 105 mmol/L (ref 98–110)
Creat: 1.18 mg/dL (ref 0.70–1.35)
Globulin: 2.7 g/dL (calc) (ref 1.9–3.7)
Glucose, Bld: 104 mg/dL — ABNORMAL HIGH (ref 65–99)
Potassium: 4.6 mmol/L (ref 3.5–5.3)
Sodium: 141 mmol/L (ref 135–146)
Total Bilirubin: 1 mg/dL (ref 0.2–1.2)
Total Protein: 6.7 g/dL (ref 6.1–8.1)
eGFR: 68 mL/min/{1.73_m2} (ref >=60)

## 2022-04-10 LAB — LDL CHOLESTEROL, DIRECT: Direct LDL: 82 mg/dL (ref <100)

## 2022-04-10 LAB — HEMOGLOBIN AND HEMATOCRIT, BLOOD
HCT: 43.2 % (ref 38.5–50.0)
Hemoglobin: 14.4 g/dL (ref 13.2–17.1)

## 2022-04-10 LAB — BRAIN NATRIURETIC PEPTIDE: Brain Natriuretic Peptide: 59 pg/mL (ref <100)

## 2022-04-13 ENCOUNTER — Encounter: Payer: Self-pay | Admitting: Cardiology

## 2022-04-13 ENCOUNTER — Ambulatory Visit: Payer: Medicare Other | Admitting: Cardiology

## 2022-04-13 VITALS — BP 118/66 | HR 77 | Resp 18 | Ht 66.0 in | Wt 290.8 lb

## 2022-04-13 DIAGNOSIS — I6523 Occlusion and stenosis of bilateral carotid arteries: Secondary | ICD-10-CM | POA: Diagnosis not present

## 2022-04-13 DIAGNOSIS — E7800 Pure hypercholesterolemia, unspecified: Secondary | ICD-10-CM | POA: Diagnosis not present

## 2022-04-13 DIAGNOSIS — I10 Essential (primary) hypertension: Secondary | ICD-10-CM | POA: Diagnosis not present

## 2022-04-13 DIAGNOSIS — E781 Pure hyperglyceridemia: Secondary | ICD-10-CM

## 2022-04-13 DIAGNOSIS — Z7901 Long term (current) use of anticoagulants: Secondary | ICD-10-CM | POA: Diagnosis not present

## 2022-04-13 DIAGNOSIS — I4819 Other persistent atrial fibrillation: Secondary | ICD-10-CM

## 2022-04-13 DIAGNOSIS — G459 Transient cerebral ischemic attack, unspecified: Secondary | ICD-10-CM

## 2022-04-13 DIAGNOSIS — I5032 Chronic diastolic (congestive) heart failure: Secondary | ICD-10-CM | POA: Diagnosis not present

## 2022-04-13 DIAGNOSIS — G4733 Obstructive sleep apnea (adult) (pediatric): Secondary | ICD-10-CM

## 2022-04-13 MED ORDER — DAPAGLIFLOZIN PROPANEDIOL 10 MG PO TABS: 10.0000 mg | ORAL_TABLET | Freq: Every day | ORAL | 0 refills | Status: DC

## 2022-04-13 MED ORDER — FENOFIBRATE 145 MG PO TABS: 145.0000 mg | ORAL_TABLET | Freq: Every day | ORAL | 0 refills | Status: DC

## 2022-04-13 NOTE — Progress Notes (Signed)
Date:  04/13/2022   ID:  Edward Weaver, DOB 1955/05/23, MRN [id number]  PCP:  Townsend Roger, MD  Cardiologist:  Rex Kras, DO, [facility] (established care 08/13/2020)  Date: 04/13/22 Last Office Visit: 02/17/2022  Chief Complaint  Patient presents with   Shortness of Breath    HPI  Edward Weaver is a 67 y.o. male whose past medical history and cardiovascular risk factors include: Chronic HFpEF, hx of COVID, OSA not CPAP, Atrial Fibrillation, TIA, Hypertension, Hyperlipidemia, bilateral carotid artery stenosis, obesity due to excess calories.  Patient is accompanied by his wife [name] at today's office visit.  Patient is being followed by the practice for management of persistent atrial fibrillation, chronic HFpEF, and carotid disease.  Since last office visit patient states that he is more short of breath at rest and with effort related activities.  He is not sure if the shortness of breath is secondary to underlying panic attacks/anxiety or given his comorbid conditions.  He is also gained 89 pounds since last office visit due to dietary indiscretion.  And he also does not take his Lasix on a regular basis.  Labs from 04/09/2022 independently reviewed and noted below for further reference.  Unfortunately, patient has not been working on lifestyle changes with regards to weight loss, glycemic control, improving lipids, noncompliant with CPAP, etc.  FUNCTIONAL STATUS: No structured exercise program or daily routine.   ALLERGIES: Allergies  Allergen Reactions   Penicillins Other (See Comments)    Syncope, hypotension    MEDICATION LIST PRIOR TO VISIT: Current Meds  Medication Sig   atorvastatin (LIPITOR) 40 MG tablet TAKE 1 TABLET (40 MG TOTAL) BY MOUTH AT BEDTIME.   dapagliflozin propanediol (FARXIGA) 10 MG TABS tablet Take 1 tablet (10 mg total) by mouth daily before breakfast.   fenofibrate (TRICOR) 145 MG tablet Take 1 tablet (145 mg total) by mouth daily.   furosemide  (LASIX) 20 MG tablet Take 1 tablet (20 mg total) by mouth every morning.   gabapentin (NEURONTIN) 300 MG capsule TAKE 1 CAPSULE (300 MG TOTAL) BY MOUTH 3 (THREE) TIMES DAILY.   metoprolol succinate (TOPROL-XL) 100 MG 24 hr tablet TAKE 1 TABLET BY MOUTH ONCE DAILY AT BEDTIME FOR ATRIAL FIBRILLATION   rivaroxaban (XARELTO) 20 MG TABS tablet Take 1 tablet (20 mg total) by mouth daily with supper.   sacubitril-valsartan (ENTRESTO) 97-103 MG Take 1 tablet by mouth 2 (two) times daily.     PAST MEDICAL HISTORY: Past Medical History:  Diagnosis Date   Arthritis    Atrial fibrillation ([place])    Bell's palsy    Carotid artery stenosis, asymptomatic, bilateral    Edema    Hiatal hernia    Hyperlipidemia    Hypertension    Stroke [facility])    TIA (transient ischemic attack)    Nov and/or Dec 2014    PAST SURGICAL HISTORY: Past Surgical History:  Procedure Laterality Date   ESOPHAGUS SURGERY     KNEE SURGERY Right    1974   TONSILLECTOMY     WISDOM TOOTH EXTRACTION      FAMILY HISTORY: The patient family history includes Alzheimer's disease in his father and paternal aunt; Atrial fibrillation in his son; Cancer - Other in his mother; Dementia in his paternal aunt; Healthy in his brother; Heart disease in his mother; Hypertension in his father, mother, and son; Stroke in his paternal aunt and son; Stroke (age of onset: 37) in his brother.  SOCIAL HISTORY:  The patient  reports that he has never smoked. He has never used smokeless tobacco. He reports that he does not drink alcohol and does not use drugs.  REVIEW OF SYSTEMS: Review of Systems  Constitutional: Negative for chills and fever.  HENT:  Negative for hoarse voice and nosebleeds.   Eyes:  Negative for discharge, double vision and pain.  Cardiovascular:  Positive for dyspnea on exertion (chronic and stable.), orthopnea and paroxysmal nocturnal dyspnea. Negative for chest pain, claudication, leg swelling, near-syncope, palpitations and  syncope.  Respiratory:  Positive for shortness of breath and snoring. Negative for hemoptysis.   Musculoskeletal:  Negative for muscle cramps and myalgias.  Gastrointestinal:  Negative for abdominal pain, constipation, diarrhea, hematemesis, hematochezia, melena, nausea and vomiting.  Neurological:  Negative for dizziness and light-headedness.   PHYSICAL EXAM:    04/13/2022    1:57 PM 02/17/2022    1:53 PM 12/26/2021    8:00 PM  Vitals with BMI  Height 5\' 6"  5\' 6"    Weight 290 lbs 13 oz 282 lbs 13 oz   BMI 46.96 45.67   Systolic 118 118  Diastolic 66 74 74  Pulse 77 52 60    CONSTITUTIONAL: Appears older than stated age, hemodynamically stable, no acute distress.    SKIN: Skin is warm and dry. No rash noted. No cyanosis. No pallor. No jaundice HEAD: Normocephalic and atraumatic.  EYES: No scleral icterus MOUTH/THROAT: Moist oral membranes.  NECK: Unable to evaluate JVP due to a short neck stature, no JVD present. No thyromegaly noted. No carotid bruits  CHEST Normal respiratory effort. No intercostal retractions  LUNGS: Clear to auscultation bilaterally with decreased breath sounds at the bases.  No stridor. No wheezes. No rales.  CARDIOVASCULAR: Irregularly irregular, variable S1-S2, no murmurs rubs or gallops appreciated. ABDOMINAL: Obese, soft, nontender, nondistended, positive bowel sounds in all 4 quadrants, no abdominal bruits appreciated.  No apparent ascites.  EXTREMITIES: +1 pitting edema bilaterally, +2 dorsalis pedis and posterior tibial pulses, poor nail hygiene.   HEMATOLOGIC: No significant bruising NEUROLOGIC: Oriented to person, place, and time. Nonfocal. Normal muscle tone.  PSYCHIATRIC: Normal mood and affect. Normal behavior. Cooperative  CARDIAC DATABASE: EKG: 02/17/2022: Atrial fibrillation, rate controlled, 76 bpm, poor R wave progression, low voltage, without underlying injury pattern. 04/13/2022: Atrial fibrillation, 63 bpm, without underlying injury  pattern.  Echocardiogram: 01/11/2017: LVEF 50-55%  08/22/2020: LVEF 45-50%, moderate LVH, elevated LAP, moderately dilated left atrium, mild MR, mild TR.  07/23/2021:  Left ventricle cavity is normal in size. Moderate concentric hypertrophy of the left ventricle. Normal LV systolic function with visual EF 50-55%.  Normal global wall motion. Unable to evaluate diastolic function due to atrial fibrillation.  Left atrial cavity is mildly dilated.  Mild to moderate mitral regurgitation.  Mild to moderate tricuspid regurgitation.  No evidence of pulmonary hypertension.  No significant change compared to previous study on 08/22/2020.  Stress Testing: Lexiscan Tetrofosmin stress test 08/28/2020: Stress EKG is non-diagnostic, as this is pharmacological stress test using Lexiscan. Stress EKG is non-diagnostic, as this is pharmacological stress test. Rest and stress EKG showed atrial fibrillation, no ST-T abnormality. SPECT images show normal stress myocardial perfusion. Decreased tracer uptake in inferior myocardium at rest likely due diaphragmatic attenuation. Stress LVEF calculated 48%, but visually appears normal. Low risk study.  Heart Catheterization: None   Carotid Duplex: Carotid artery duplex 07/11/2021:  Duplex suggests stenosis in the right internal carotid artery (50-69%).  Duplex suggests stenosis in the left internal carotid artery (16-49%).  There  is diffuse homogeneous plaque in the bilateral carotid arteries.  Antegrade right vertebral artery flow. Antegrade left vertebral artery flow.  No significant change since 08/22/2020. Follow up in six months is appropriate if clinically indicated.  LABORATORY DATA:    Latest Ref Rng & Units 04/09/2022   12:51 PM 12/26/2021    7:13 PM 12/31/2020    6:50 PM  CBC  WBC 4.0 - 10.5 K/uL  6.4  8.5   Hemoglobin 13.2 - 17.1 g/dL 40.1  02.7  25.3   Hematocrit 38.5 - 50.0 % 43.2  42.9  43.6   Platelets 150 - 400 K/uL  286  305        Latest  Ref Rng & Units 04/09/2022   12:51 PM 12/26/2021    7:13 PM 07/23/2021    9:00 AM  CMP  Glucose 65 - 99 mg/dL 664  403  474   BUN 7 - 25 mg/dL 18  15  17    Creatinine 0.70 - 1.35 mg/dL  2.59  5.63   Sodium 135 - 146 mmol/L 141  139  139   Potassium 3.5 - 5.3 mmol/L 4.6  4.1  4.9   Chloride 98 - 110 mmol/L 105  105  106   CO2 20 - 32 mmol/L 26  26  25    Calcium 8.6 - 10.3 mg/dL 9.2  9.3  9.4   Total Protein 6.1 - 8.1 g/dL 6.7  7.5    Total Bilirubin 0.2 - 1.2 mg/dL 1.0  0.7    Alkaline Phos 38 - 126 U/L  65    AST 10 - 35 U/L 11  15    ALT 9 - 46 U/L 22  28      Lipid Panel  Lab Results  Component Value Date   CHOL 142 04/09/2022   HDL 46 04/09/2022   LDLCALC 71 04/09/2022   LDLDIRECT 82 04/09/2022   TRIG 170 (H) 04/09/2022   CHOLHDL 3.1 04/09/2022    No components found for: "NTPROBNP" No results for input(s): "PROBNP" in the last 8760 hours.  No results for input(s): "TSH" in the last 8760 hours.  BMP Recent Labs    07/23/21 0900 12/26/21 1913 04/09/22 1251  NA 139 139 141  K 4.9 4.1 4.6  CL 106 105 105  CO2 25 26 26   GLUCOSE 106* 108* 104*  BUN 17 15 18   CREATININE 1.17 1.01 1.18  CALCIUM 9.4 9.3 9.2  GFRNONAA  --  >60  --     HEMOGLOBIN A1C Lab Results  Component Value Date   HGBA1C 5.9 (A) 05/19/2018   MPG 125.5 01/10/2017   External Labs: Collected: 08/06/2020 provided by PCP Creatinine 1 mg/dL. eGFR: 79 mL/min per 1.73 m Lipid profile: Total cholesterol 218, triglycerides 269, HDL 45, LDL 132, non HDL 173 Hemoglobin A1c: 6 TSH: 2.06  IMPRESSION:    ICD-10-CM   1. Chronic heart failure with preserved ejection fraction (HFpEF) (HCC)  I50.32 dapagliflozin propanediol (FARXIGA) 10 MG TABS tablet    Pro b natriuretic peptide (BNP)    Basic metabolic panel    Magnesium    2. Persistent atrial fibrillation (HCC)  I48.19 EKG 12-Lead    3. Long term (current) use of anticoagulants  Z79.01     4. Benign hypertension  I10     5.  Bilateral carotid artery stenosis  I65.23     6. Hypercholesterolemia  E78.00 CT CARDIAC SCORING ([facility] LOCATIONS ONLY)    7. OSA (obstructive sleep  apnea)  G47.33     8. TIA (transient ischemic attack)  G45.9     9. Class 3 severe obesity due to excess calories with serious comorbidity and body mass index (BMI) of 45.0 to 49.9 in adult (HCC)  E66.01    Z68.42     10. Hypertriglyceridemia  E78.1 fenofibrate (TRICOR) 145 MG tablet       RECOMMENDATIONS: Edward Weaver is a 67 y.o. male whose past medical history and cardiac risk factors include: Chronic HFpEF, OSA not CPAP, Atrial Fibrillation, TIA, Hypertension, Hyperlipidemia, bilateral carotid artery stenosis, obesity due to excess calories.  Chronic heart failure with preserved ejection fraction (HFpEF) (HCC) Stage B, NYHA class II/III. Has gained approximately 8 pounds since his last visit from December 2023 -likely due to dietary indiscretion, not taking his diuretics regularly. Patient is advised to take his Lasix regularly and will start Farxiga (medication profile discussed).  1 week of samples provided for [place].   Repeat labs in 1 week to evaluate renal function and electrolytes. Recent labs from January 2024 independently reviewed.  BNP within normal limits and renal function at baseline Educated on importance of medication and follow-up compliance. Strict I's and O's and daily weights.  Persistent atrial fibrillation (HCC) Rate control: Metoprolol succinate Rhythm control: N/A Thromboembolic prophylaxis: Xarelto CHA2DS2-VASc SCORE is 5 which correlates to 7.2 % risk of stroke per year. Patient does not endorse any evidence of bleeding. Diagnosed in 2019, per patient. In the past has refused to be on antiarrhythmic medications, catheter directed procedures, or cardioversion.  Long term (current) use of anticoagulants Indication: Persistent atrial fibrillation. Currently on Xarelto. Does not endorse evidence of  bleeding. Most recent hemoglobin from January 2024 independently reviewed-at baseline  Benign hypertension Office blood pressures are well-controlled. Medications reconciled  Bilateral carotid artery stenosis\ History of TIA (transient ischemic attack) Asymptomatic. No focal neurological deficits. Scheduled to have a repeat carotid duplex on 06/19/2022.  Hypertriglyceridemia: Start fenofibrate medication profile discussed.  Patient's dyspnea is multifactorial: Underlying A-fib, HFpEF, medication noncompliance, obesity, deconditioning.  Shared decision was to proceed with coronary calcium score for further risk stratification.  His last MPI was reported to be low risk.   FINAL MEDICATION LIST END OF ENCOUNTER: Meds ordered this encounter  Medications   dapagliflozin propanediol (FARXIGA) 10 MG TABS tablet    Sig: Take 1 tablet (10 mg total) by mouth daily before breakfast.    Dispense:  90 tablet    Refill:  0   fenofibrate (TRICOR) 145 MG tablet    Sig: Take 1 tablet (145 mg total) by mouth daily.    Dispense:  90 tablet    Refill:  0     There are no discontinued medications.    Current Outpatient Medications:    atorvastatin (LIPITOR) 40 MG tablet, TAKE 1 TABLET (40 MG TOTAL) BY MOUTH AT BEDTIME., Disp: 90 tablet, Rfl: 0   dapagliflozin propanediol (FARXIGA) 10 MG TABS tablet, Take 1 tablet (10 mg total) by mouth daily before breakfast., Disp: 90 tablet, Rfl: 0   fenofibrate (TRICOR) 145 MG tablet, Take 1 tablet (145 mg total) by mouth daily., Disp: 90 tablet, Rfl: 0   furosemide (LASIX) 20 MG tablet, Take 1 tablet (20 mg total) by mouth every morning., Disp: 90 tablet, Rfl: 0   gabapentin (NEURONTIN) 300 MG capsule, TAKE 1 CAPSULE (300 MG TOTAL) BY MOUTH 3 (THREE) TIMES DAILY., Disp: 21 capsule, Rfl: 0   metoprolol succinate (TOPROL-XL) 100 MG 24 hr tablet, TAKE 1  TABLET BY MOUTH ONCE DAILY AT BEDTIME FOR ATRIAL FIBRILLATION, Disp: 30 tablet, Rfl: 6   rivaroxaban (XARELTO)  20 MG TABS tablet, Take 1 tablet (20 mg total) by mouth daily with supper., Disp: 90 tablet, Rfl: 3   sacubitril-valsartan (ENTRESTO) 97-103 MG, Take 1 tablet by mouth 2 (two) times daily., Disp: 180 tablet, Rfl: 3  Orders Placed This Encounter  Procedures   CT CARDIAC SCORING ([facility] LOCATIONS ONLY)   Pro b natriuretic peptide (BNP)   Basic metabolic panel   Magnesium   EKG 12-Lead    There are no Patient Instructions on file for this visit.   --Continue cardiac medications as reconciled in final medication list. --Return in about 6 weeks (around 05/25/2022) for Follow up, heart failure management.. Or sooner if needed. --Continue follow-up with your primary care physician regarding the management of your other chronic comorbid conditions.  Patient's questions and concerns were addressed to his satisfaction. He voices understanding of the instructions provided during this encounter.   This note was created using a voice recognition software as a result there may be grammatical errors inadvertently enclosed that do not reflect the nature of this encounter. Every attempt is made to correct such errors.  Total time spent: 45 minutes.  As part of this visit reviewed ED records from 12/31/2020 which included note, EKG, labs these findings have been summarized and noted above for further reference.  Discussed disease management, ordering diagnostic testing, coordination of care and patient education provided as a part of today's encounter.   Tessa Lerner, [place], [facility]  Pager: [phone] Office: [phone]

## 2022-04-14 ENCOUNTER — Other Ambulatory Visit: Payer: Self-pay | Admitting: Cardiology

## 2022-04-14 DIAGNOSIS — R072 Precordial pain: Secondary | ICD-10-CM

## 2022-04-17 ENCOUNTER — Other Ambulatory Visit: Payer: Self-pay

## 2022-04-17 DIAGNOSIS — I5032 Chronic diastolic (congestive) heart failure: Secondary | ICD-10-CM

## 2022-04-17 MED ORDER — DAPAGLIFLOZIN PROPANEDIOL 10 MG PO TABS: 10.0000 mg | ORAL_TABLET | Freq: Every day | ORAL | 0 refills | Status: AC

## 2022-04-20 ENCOUNTER — Encounter: Payer: Medicare Other | Admitting: Internal Medicine

## 2022-04-27 ENCOUNTER — Other Ambulatory Visit: Payer: Self-pay

## 2022-04-27 ENCOUNTER — Ambulatory Visit (HOSPITAL_COMMUNITY)
Admission: RE | Admit: 2022-04-27 | Discharge: 2022-04-27 | Disposition: A | Discharge status: Home / Self Care | Payer: Medicare Other | Source: Ambulatory Visit | Attending: Cardiology | Admitting: Cardiology

## 2022-04-27 DIAGNOSIS — E7800 Pure hypercholesterolemia, unspecified: Secondary | ICD-10-CM

## 2022-04-27 DIAGNOSIS — I5032 Chronic diastolic (congestive) heart failure: Secondary | ICD-10-CM

## 2022-04-27 NOTE — Addendum Note (Signed)
Addended by: Matthew Saras on: 04/27/2022 01:25 PM   Modules accepted: Orders

## 2022-05-01 ENCOUNTER — Other Ambulatory Visit: Payer: Self-pay | Admitting: Cardiology

## 2022-05-01 ENCOUNTER — Other Ambulatory Visit: Payer: Self-pay

## 2022-05-01 DIAGNOSIS — E7800 Pure hypercholesterolemia, unspecified: Secondary | ICD-10-CM

## 2022-05-01 DIAGNOSIS — I2510 Atherosclerotic heart disease of native coronary artery without angina pectoris: Secondary | ICD-10-CM

## 2022-05-01 DIAGNOSIS — I5032 Chronic diastolic (congestive) heart failure: Secondary | ICD-10-CM

## 2022-05-01 DIAGNOSIS — G4733 Obstructive sleep apnea (adult) (pediatric): Secondary | ICD-10-CM

## 2022-05-01 DIAGNOSIS — G459 Transient cerebral ischemic attack, unspecified: Secondary | ICD-10-CM

## 2022-05-01 DIAGNOSIS — I4819 Other persistent atrial fibrillation: Secondary | ICD-10-CM

## 2022-05-01 NOTE — Progress Notes (Signed)
Moderate coronary artery calcification.  Reemphasized the importance of secondary prevention -blood pressure, blood glucose, and lipid management.  Given his underlying dyspnea, multiple cardiovascular risk factors, and moderate CAC will proceed with cardiac PET/CT to rule out reversible ischemia.  Shared Decision Making/Informed Consent The risks [chest pain, shortness of breath, cardiac arrhythmias, dizziness, blood pressure fluctuations, myocardial infarction, stroke/transient ischemic attack, nausea, vomiting, allergic reaction, radiation exposure, metallic taste sensation and life-threatening complications (estimated to be 1 in 10,000)], benefits (risk stratification, diagnosing coronary artery disease, treatment guidance) and alternatives of a cardiac PET stress test were discussed in detail with Edward Weaver and he agrees to proceed.  Toshie Demelo Sawpit, DO, [facility]

## 2022-05-01 NOTE — Progress Notes (Signed)
Labs ordered and released. Pt scheduled for 05/08/22

## 2022-05-01 NOTE — Progress Notes (Signed)
LMTCB

## 2022-05-04 ENCOUNTER — Ambulatory Visit: Payer: Medicare Other | Admitting: Cardiology

## 2022-05-05 ENCOUNTER — Ambulatory Visit: Payer: Medicare Other | Admitting: Internal Medicine

## 2022-05-05 LAB — LIPID PANEL
Cholesterol: 137 mg/dL (ref <200)
HDL: 42 mg/dL (ref >=40)
LDL Cholesterol (Calc): 73 mg/dL (calc)
Non-HDL Cholesterol (Calc): 95 mg/dL (calc) (ref <130)
Total CHOL/HDL Ratio: 3.3 (calc) (ref <5.0)
Triglycerides: 138 mg/dL (ref <150)

## 2022-05-05 LAB — BASIC METABOLIC PANEL
BUN: 18 mg/dL (ref 7–25)
CO2: 30 mmol/L (ref 20–32)
Calcium: 9.2 mg/dL (ref 8.6–10.3)
Chloride: 103 mmol/L (ref 98–110)
Creat: 1.19 mg/dL (ref 0.70–1.35)
Glucose, Bld: 106 mg/dL — ABNORMAL HIGH (ref 65–99)
Potassium: 5.1 mmol/L (ref 3.5–5.3)
Sodium: 140 mmol/L (ref 135–146)

## 2022-05-05 LAB — BRAIN NATRIURETIC PEPTIDE: Brain Natriuretic Peptide: 127 pg/mL — ABNORMAL HIGH (ref <100)

## 2022-05-05 LAB — HEMOGLOBIN AND HEMATOCRIT, BLOOD
HCT: 44.7 % (ref 38.5–50.0)
Hemoglobin: 15.1 g/dL (ref 13.2–17.1)

## 2022-05-05 LAB — LDL CHOLESTEROL, DIRECT: Direct LDL: 86 mg/dL (ref <100)

## 2022-05-06 ENCOUNTER — Encounter: Payer: Self-pay | Admitting: Internal Medicine

## 2022-05-06 ENCOUNTER — Ambulatory Visit: Payer: Medicare Other | Admitting: Internal Medicine

## 2022-05-06 VITALS — BP 132/74 | HR 50 | Temp 97.4°F | Ht 66.0 in | Wt 286.0 lb

## 2022-05-06 DIAGNOSIS — E7800 Pure hypercholesterolemia, unspecified: Secondary | ICD-10-CM

## 2022-05-06 DIAGNOSIS — Z1211 Encounter for screening for malignant neoplasm of colon: Secondary | ICD-10-CM

## 2022-05-06 DIAGNOSIS — I700 Atherosclerosis of aorta: Secondary | ICD-10-CM

## 2022-05-06 DIAGNOSIS — I5032 Chronic diastolic (congestive) heart failure: Secondary | ICD-10-CM | POA: Diagnosis not present

## 2022-05-06 DIAGNOSIS — I714 Abdominal aortic aneurysm, without rupture, unspecified: Secondary | ICD-10-CM | POA: Diagnosis not present

## 2022-05-06 DIAGNOSIS — G4733 Obstructive sleep apnea (adult) (pediatric): Secondary | ICD-10-CM | POA: Insufficient documentation

## 2022-05-06 DIAGNOSIS — I10 Essential (primary) hypertension: Secondary | ICD-10-CM

## 2022-05-06 DIAGNOSIS — I6523 Occlusion and stenosis of bilateral carotid arteries: Secondary | ICD-10-CM

## 2022-05-06 DIAGNOSIS — Z6841 Body Mass Index (BMI) 40.0 and over, adult: Secondary | ICD-10-CM | POA: Insufficient documentation

## 2022-05-06 DIAGNOSIS — Z0000 Encounter for general adult medical examination without abnormal findings: Secondary | ICD-10-CM

## 2022-05-06 DIAGNOSIS — I4891 Unspecified atrial fibrillation: Secondary | ICD-10-CM

## 2022-05-06 DIAGNOSIS — R3911 Hesitancy of micturition: Secondary | ICD-10-CM | POA: Diagnosis not present

## 2022-05-06 DIAGNOSIS — G629 Polyneuropathy, unspecified: Secondary | ICD-10-CM

## 2022-05-06 DIAGNOSIS — R7303 Prediabetes: Secondary | ICD-10-CM

## 2022-05-06 DIAGNOSIS — J40 Bronchitis, not specified as acute or chronic: Secondary | ICD-10-CM

## 2022-05-06 DIAGNOSIS — I679 Cerebrovascular disease, unspecified: Secondary | ICD-10-CM

## 2022-05-06 MED ORDER — DOXYCYCLINE MONOHYDRATE 100 MG PO CAPS: 100.0000 mg | ORAL_CAPSULE | Freq: Two times a day (BID) | ORAL | 0 refills | Status: DC

## 2022-05-06 NOTE — Assessment & Plan Note (Signed)
He has HFpEF where he is currently compensated.  He is not compliant with his lasix and he has 1+ edema in his legs today.  I want him to continue on his entresto (he has some decreased EF 40-45% in 2022, repeat ECHO shows improvement).  He needs to start back on his lasix.  Cardiology just started him on farxiga as well.

## 2022-05-06 NOTE — Assessment & Plan Note (Signed)
He has a history of TIA but has never had a full stroke.  Continue with risk factor modfication with prevention.

## 2022-05-06 NOTE — Assessment & Plan Note (Signed)
He has aortic atherosclerosis with carotoid stenosis where his LDL is now around 70.  We will continue with his statin use.

## 2022-05-06 NOTE — Assessment & Plan Note (Signed)
Continue with his statin and he is on xarelto (not ASA) for his PAD.  He has a repeat carotid duplex coming in 06/2022.

## 2022-05-06 NOTE — Assessment & Plan Note (Signed)
His BP is well controlled.  We will continue to monitor.

## 2022-05-06 NOTE — Progress Notes (Signed)
Preventive Screening-Counseling & Management     Edward Weaver is a 67 y.o. male who presents for Medicare Annual/Subsequent preventive examination.  The patient is a 67 yo male who comes in today for an annual wellness visit.  He has a past medical history of prediabetes, HTN, HCL, A. Fib, TIA, OSA, carotid stenosis, neuropathy and Chronic HFpEF (chronic diastolic CHF).  His last eye exam was 8-9 years ago where he has some blurred vision.  The patient has never had a coloscopy.  There is no family history of colorectal cancer but the patient's father had prostate cancer.  He denies any problems with urination.  He does not exercise regularly.  He has never smoked.  He does not get yearly flu vaccines.  He has not had any of the pneumococcal vaccines or shingles vaccines.  He is not interested in the RSV or COVID-19 vaccines.  Both his mother and father have had strokes in the past and his mother had a MI in the past.  He is not on an ASA but is on Xarelto 73m daily.  The patient is a 67year old male who returns for a follow-up visit for his prediabetes. He was diagnosed with prediabetes about a year ago with routine labs. Since the last visit, there have been no problems. He remains on no medications; the diabetes is being managed by dietary intervention alone. He is not walking as much as they would like. He specifically denies unexplained abdominal pain, nausea or vomiting. He does not routinely check blood sugars. He came in fasting today in anticipation of lab work. He does have neuropathy which was diagnosed 3-4 years where he has a dull aching pain in his feet but has a burning pain in his palms. ON his last visit, he was on gabapentin 9050mat night and was still having pain.  I added gabapentin 30036mn the morning and afternoon and continued 900m74m night and this has helped with his pain.    This patient also has moderately severe atrial fibrillation which was diagnosed about 4-5  years ago. He states he was cutting a tree and he began having diaphoresis and weakness where he went to the ER and they found he was in A. Fib. He states he was cardioverted at that time. [facility] [place] his last visit on 04/13/2022.  They currently have him on Toprol XL 100mg29mly for rate control and he is anticoagulated with Xarelto 20mg 27my.  His CHADVASC2 score is a 5.  He had a prior ECHO in 01/11/2017 which showed a LVEF 50-55%.  They repeat an ECHO on 08/22/2020 and this showed a LVEF 45-50%, moderate LVH, elevated LAP, moderately dilated left atrium, mild MR, mild TR.  He had a repeat ECHO done on 07/23/2021 and this showed a LVEF of 50-55% with moderate concentric hypertrophy of the left ventricle. Normal LV systolic function with visual EF 50-55%.  Normal global wall motion and they were unable to evaluate diastolic function due to atrial fibrillation. His LA cavity was mildly dilated and he had some mild to moderate mitral regurgitation as well as mild to moderate tricuspid regurgitation. There was no evidence of pulmonary hypertension and there was no significant change compared to previous study on 08/22/2020. His atrial fibrillation is controlled with therapy as summarized in the medication list and previous notes. Specifically denied complaints: chest pain, palpitations, TIAs, edema, exertional dyspnea, and syncope. The patient denies  any hemoptysis, hematuria, BRBPR or melena.   Edward Weaver returns today for routine followup on his cholesterol.  His cardiologist saw him several months ago and increased his atorvastatin from 27m to 425mand he is also on fenofibrate.   Overall, he states he is doing well and is without any complaints or problems at this time. He specifically denies chest pain, abdominal pain, nausea, diarrhea, and myalgias. He remains on dietary management as well as the following cholesterol lowering medications  atorvastatin 4081mhs and fenofibrate 145m52mily. His cardiologist checked his FLP about 10 days ago and his LDL was 71 at that time.  The patient is a 66 y17r old male who presents for a follow-up evaluation of hypertension. The patient has not been checking his blood pressure at home. The patient's current medications include: Toprol XL 100mg61mlet and lasix 20mg 58my.  He admits to me he is not taking the lasix at all.  The patient has been tolerating his medications well. The patient denies any headache, visual changes, dizziness, lightheadness, chest pain, shortness of breath, weakness/numbness, and edema.   He reports that he had a TIA/mini stroke about 7 years ago. He states he had acute facial drooping and slurred speech where he went to the [facility] [place] did a [place] scan of his head (this history was obtained from patient- no notes available) where his head CT showed no stroke. He was told he had a mini-stroke as the symptoms only lasted for 3-4 minutes. He is currently on BP as well as cholesterol medications. The patient does not smoke.   The patient also has a history of OSA where he has seen Dr. ChaudrGardenia Phlegm years ago.  He went for a sleep study and was sent home with a CPAP but the patient is not compliant with it.  This patient also has chronic systolic CHF which he found out about when he went to the hospital as above for his A. Fib 4-5 years ago. He has been seeing cardiology and is currently on entresto 97/103mg B79m They state he has Stage B, NYHA class II/III.  They did start him on farxiga 10mg da65mon his last visit on 04/13/2022.  His cardiologist notes he has HFpEF but again on his previous ECHO as above his EF in 2022 showed an LVEF 45-50%.  He also has Lasix daily and will get lower extremity edema when he does not use the Lasix.  He has been noncompliant with his lasix. The etiology of the CHF is noted in the PMH. His CHF has been in a compensated state  on medications as noted in the medication list. He has the following baseline symptoms: exertional dyspnea. He has 2 pillow orthopnea which he has had for years. He also states he will get exertional dyspnea after going up about 20 stairs. Specifically denied complaints: PND, edema, worsening orthopnea, and worsening edema.   Due to his exertional dyspnea, his cardiologist just recently order a CT coronary calcium scoring which was done on 04/27/2022.  This showed a a few scattered right pulmonary nodules measuring up to 0.5 cm that were not previously imaged. 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 He had aortic Atherosclerosis.  He was also noted to have moderate coronary artery calcification.  They were called with this information and cardiology is setting him up for a cardiac PET/CT stress test to rule out reversible ischemia.  The patient had  a previous lexican nuclear stress test done on 08/28/2020 .  This showed A. Fib without ST-T abnormalites and his nulcear portion showed a normal stress myocardial perfusion.  His LVEF was 48%.  The patient has never had a cardiac catherization.    He also has a history of carotid stenosis.  He states he was diagnosed with this years ago.  His most recent carotid duplex was done on 07/11/2021 which 50-69% stenosis of the right internal carotid artery and 16-49% stenosis of the left internal carotid artery.  There was diffuse homogeneous plaque in the bilateral carotid arteries.  Antegrade right vertebral artery flow. Antegrade left vertebral artery flow. There was no significant change since 08/22/2020.  He is scheduled to have a repeat carotid dopler done on 06/19/2022.       Are there smokers in your home (other than you)? Yes  Risk Factors Current exercise habits:  as above   Dietary issues discussed: none   Depression Screen (Note: if answer to either of the following is "Yes", a more complete depression screening is indicated)   Over the past  two weeks, have you felt down, depressed or hopeless? No  Over the past two weeks, have you felt little interest or pleasure in doing things? No  Have you lost interest or pleasure in daily life? No  Do you often feel hopeless? No  Do you cry easily over simple problems? No  Activities of Daily Living In your present state of health, do you have any difficulty performing the following activities?:  Driving? No Managing money?  No Feeding yourself? No Getting from bed to chair? No Climbing a flight of stairs? No Preparing food and eating?: No Bathing or showering? No Getting dressed: No Getting to the toilet? No Using the toilet:No Moving around from place to place: No In the past year have you fallen or had a near fall?:No   Are you sexually active?  Yes  Do you have more than one partner?  No  Hearing Difficulties: No Do you often ask people to speak up or repeat themselves? No Do you experience ringing or noises in your ears? Yes Do you have difficulty understanding soft or whispered voices? No   Do you feel that you have a problem with memory? No  Do you often misplace items? No  Do you feel safe at home?  Yes  Cognitive Testing  Alert? Yes  Normal Appearance?Yes  Oriented to person? Yes  Place? Yes   Time? Yes  Recall of three objects?  Yes  Can perform simple calculations? Yes  Displays appropriate judgment?Yes  Can read the correct time from a watch face?Yes  Fall Risk Prevention  Any stairs in or around the home? No  If so, are there any without handrails? No  Home free of loose throw rugs in walkways, pet beds, electrical cords, etc? No  Adequate lighting in your home to reduce risk of falls? Yes  Use of a cane, walker or w/c? No    Time Up and Go  Was the test performed? Yes .  Length of time to ambulate 10 feet: 7 sec.   Gait steady and fast without use of assistive device    Advanced Directives have been discussed with the patient? No   List  the Names of Other Physician/Practitioners you currently use: Patient Care Team: Townsend Roger, MD as PCP - General (Internal Medicine)    Past Medical History:  Diagnosis Date   Arthritis  Atrial fibrillation (HCC)    Bell's palsy    Carotid artery stenosis, asymptomatic, bilateral    Cerebrovascular disease    Chronic diastolic (congestive) heart failure (HCC)    Hiatal hernia    Hyperlipidemia    Hypertension    Neuropathy    OSA (obstructive sleep apnea)    Prediabetes    TIA (transient ischemic attack)    Nov and/or Dec 2014    Past Surgical History:  Procedure Laterality Date   ESOPHAGUS SURGERY     KNEE SURGERY Right    1974   TONSILLECTOMY     WISDOM TOOTH EXTRACTION        Current Medications  Current Outpatient Medications  Medication Sig Dispense Refill   atorvastatin (LIPITOR) 40 MG tablet TAKE 1 TABLET (40 MG TOTAL) BY MOUTH AT BEDTIME. 90 tablet 0   dapagliflozin propanediol (FARXIGA) 10 MG TABS tablet Take 1 tablet (10 mg total) by mouth daily before breakfast. 90 tablet 0   fenofibrate (TRICOR) 145 MG tablet Take 1 tablet (145 mg total) by mouth daily. 90 tablet 0   furosemide (LASIX) 20 MG tablet Take 1 tablet (20 mg total) by mouth every morning. 90 tablet 0   gabapentin (NEURONTIN) 300 MG capsule TAKE 1 CAPSULE (300 MG TOTAL) BY MOUTH 3 (THREE) TIMES DAILY. 21 capsule 0   metoprolol succinate (TOPROL-XL) 100 MG 24 hr tablet TAKE 1 TABLET BY MOUTH ONCE DAILY AT BEDTIME FOR ATRIAL FIBRILLATION 30 tablet 6   rivaroxaban (XARELTO) 20 MG TABS tablet Take 1 tablet (20 mg total) by mouth daily with supper. 90 tablet 3   sacubitril-valsartan (ENTRESTO) 97-103 MG Take 1 tablet by mouth 2 (two) times daily. 180 tablet 3   No current facility-administered medications for this visit.    Allergies Penicillins   Social History Social History   Tobacco Use   Smoking status: Never   Smokeless tobacco: Never  Substance Use Topics   Alcohol use: No      Review of Systems Review of Systems  Constitutional:  Positive for malaise/fatigue. Negative for chills, fever and weight loss.  HENT:  Negative for hearing loss.   Eyes:  Negative for blurred vision and double vision.  Respiratory:  Positive for sputum production. Negative for cough, hemoptysis, shortness of breath and wheezing.        He is having chest congestion that started 2-3 weeks ago and he is coughing up green sputum.  Cardiovascular:  Positive for leg swelling. Negative for chest pain and palpitations.  Gastrointestinal:  Negative for abdominal pain, blood in stool, constipation, diarrhea, heartburn, melena, nausea and vomiting.  Genitourinary:  Negative for hematuria.  Musculoskeletal:  Negative for myalgias.  Skin:  Negative for rash.  Neurological:  Negative for dizziness, weakness and headaches.  Psychiatric/Behavioral:  Negative for depression. The patient is not nervous/anxious.      Physical Exam:      Body mass index is 46.16 kg/m. BP 132/74 (BP Location: Right Arm, Patient Position: Sitting, Cuff Size: Normal)   Pulse (!) 50   Temp (!) 97.4 F (36.3 C) (Temporal)   Ht 5' 6"$  (1.676 m)   Wt 286 lb (129.7 kg)   BMI 46.16 kg/m   Physical Exam Constitutional:      Appearance: Normal appearance. He is not ill-appearing.  HENT:     Head: Normocephalic and atraumatic.     Right Ear: Tympanic membrane, ear canal and external ear normal.     Left Ear: Tympanic membrane, ear  canal and external ear normal.     Nose: Nose normal. No congestion or rhinorrhea.     Mouth/Throat:     Mouth: Mucous membranes are dry.     Pharynx: Oropharynx is clear. No oropharyngeal exudate or posterior oropharyngeal erythema.  Eyes:     General: No scleral icterus.    Conjunctiva/sclera: Conjunctivae normal.     Pupils: Pupils are equal, round, and reactive to light.  Neck:     Vascular: No carotid bruit.  Cardiovascular:     Rate and Rhythm: Normal rate and regular rhythm.      Pulses: Normal pulses.     Heart sounds: No murmur heard.    No friction rub. No gallop.  Pulmonary:     Effort: Pulmonary effort is normal. No respiratory distress.     Breath sounds: No wheezing, rhonchi or rales.  Abdominal:     General: Abdomen is flat. Bowel sounds are normal. There is no distension.     Palpations: Abdomen is soft.     Tenderness: There is no abdominal tenderness.  Musculoskeletal:     Cervical back: Neck supple. No tenderness.     Right lower leg: Edema present.     Left lower leg: Edema present.  Lymphadenopathy:     Cervical: No cervical adenopathy.  Skin:    General: Skin is warm and dry.     Findings: No rash.  Neurological:     General: No focal deficit present.     Mental Status: He is alert and oriented to person, place, and time.  Psychiatric:        Mood and Affect: Mood normal.        Behavior: Behavior normal.      Assessment:      Chronic diastolic heart failure (HCC)  Abdominal aortic aneurysm (AAA) without rupture, unspecified part (HCC)  Aortic atherosclerosis (HCC)  Chronic diastolic (congestive) heart failure (HCC)  Atrial fibrillation, unspecified type (HCC)  OSA (obstructive sleep apnea)  Cerebrovascular disease  Hypercholesterolemia  Bilateral carotid artery stenosis  Essential hypertension  Prediabetes  Neuropathy  BMI 45.0-49.9, adult ([place])  Morbid obesity ([place])  Bronchitis  Urinary hesitancy  Encounter for screening colonoscopy    Plan:     During the course of the visit the patient was educated and counseled about appropriate screening and preventive services including:   Pneumococcal vaccine  Influenza vaccine Colorectal cancer screening Diabetes screening  Diet review for nutrition referral? Yes ____  Not Indicated _X___   Patient Instructions (the written plan) was given to the patient.  A-fib [facility]) He has A. Fib with CHADVASC2 score of 5.  He is rate controlled on Toprol XL.  He is  to continue on xarelto and he is not having any bleeding/bruising today.  Abdominal aortic aneurysm (AAA) without rupture ([place]) He was noted to have an ascending AA measuring 4.2 cm on his recent coronary CT calcium scoring from 04/27/2022.  We will need to followup on this with [facility].    Aortic atherosclerosis ([place]) He has aortic atherosclerosis with carotoid stenosis where his LDL is now around 70.  We will continue with his statin use.  Bilateral carotid artery stenosis Continue with his statin and he is on xarelto (not ASA) for his PAD.  He has a repeat carotid duplex coming in 06/2022.  Cerebrovascular disease He has a history of TIA but has never had a full stroke.  Continue with risk factor modfication with prevention.    Chronic diastolic (  congestive) heart failure ([place]) He has HFpEF where he is currently compensated.  He is not compliant with his lasix and he has 1+ edema in his legs today.  I want him to continue on his entresto (he has some decreased EF 40-45% in 2022, repeat ECHO shows improvement).  He needs to start back on his lasix.  Cardiology just started him on farxiga as well.  Essential hypertension His BP is well controlled.  We will continue to monitor.  OSA (obstructive sleep apnea) I am going to refer him back to pulmonary as he states he is claustrophobic to his mask.  He is non compliant with his CPAP.  I discussed wieght loss measures.  Bronchitis I am going to start him on a course of doxycycline.  BMI 45.0-49.9, adult [facility]) We had a discussion about his weight.  He has morbid obesity associated with PAD, HTN, and HCL.  I want him to eat healthy and exercise and lose weight.  Hypercholesterolemia His FLP was just done about 10 days ago and his cholesterol is controlled.  Morbid obesity ([place]) Plan as above.  Prediabetes We will check a HgBA1c on him today.  His prediabetes was diet controlled but cardiology started him on farxiga for his heart.     Neuropathy His neuropathy is improved on his current change with gabapentin.   Prevention Health maintenance discussed.  He needs a colonoscopy and we will refer him out.  I will obtain some yearly labs.  Medicare Attestation I have personally reviewed: The patient's medical and social history Their use of alcohol, tobacco or illicit drugs Their current medications and supplements The patient's functional ability including ADLs,fall risks, home safety risks, cognitive, and hearing and visual impairment Diet and physical activities Evidence for depression or mood disorders  The patient's weight, height, and BMI have been recorded in the chart.  I have made referrals, counseling, and provided education to the patient based on review of the above and I have provided the patient with a written personalized care plan for preventive services.     Townsend Roger, MD   05/06/2022

## 2022-05-06 NOTE — Assessment & Plan Note (Signed)
I am going to start him on a course of doxycycline.

## 2022-05-06 NOTE — Assessment & Plan Note (Signed)
His FLP was just done about 10 days ago and his cholesterol is controlled.

## 2022-05-06 NOTE — Assessment & Plan Note (Signed)
We will check a HgBA1c on him today.  His prediabetes was diet controlled but cardiology started him on farxiga for his heart.

## 2022-05-06 NOTE — Assessment & Plan Note (Signed)
His neuropathy is improved on his current change with gabapentin.

## 2022-05-06 NOTE — Assessment & Plan Note (Signed)
He was noted to have an ascending AA measuring 4.2 cm on his recent coronary CT calcium scoring from 04/27/2022.  We will need to followup on this with [facility].

## 2022-05-06 NOTE — Assessment & Plan Note (Signed)
I am going to refer him back to pulmonary as he states he is claustrophobic to his mask.  He is non compliant with his CPAP.  I discussed wieght loss measures.

## 2022-05-06 NOTE — Assessment & Plan Note (Signed)
Plan as above.  

## 2022-05-06 NOTE — Assessment & Plan Note (Signed)
We had a discussion about his weight.  He has morbid obesity associated with PAD, HTN, and HCL.  I want him to eat healthy and exercise and lose weight.

## 2022-05-06 NOTE — Assessment & Plan Note (Signed)
He has A. Fib with CHADVASC2 score of 5.  He is rate controlled on Toprol XL.  He is to continue on xarelto and he is not having any bleeding/bruising today.

## 2022-05-07 LAB — HEMOGLOBIN A1C
Est. average glucose Bld gHb Est-mCnc: 131 mg/dL
Hgb A1c MFr Bld: 6.2 % — ABNORMAL HIGH (ref 4.8–5.6)

## 2022-05-07 LAB — PSA: Prostate Specific Ag, Serum: 2.1 ng/mL (ref 0.0–4.0)

## 2022-05-07 LAB — TSH: TSH: 1.74 u[IU]/mL (ref 0.450–4.500)

## 2022-05-07 NOTE — Progress Notes (Signed)
LMTCB

## 2022-05-08 ENCOUNTER — Encounter: Payer: Self-pay | Admitting: Cardiology

## 2022-05-08 ENCOUNTER — Ambulatory Visit: Payer: Medicare Other | Admitting: Cardiology

## 2022-05-08 VITALS — BP 109/65 | HR 51 | Resp 16 | Ht 66.0 in | Wt 286.0 lb

## 2022-05-08 DIAGNOSIS — I5032 Chronic diastolic (congestive) heart failure: Secondary | ICD-10-CM | POA: Diagnosis not present

## 2022-05-08 DIAGNOSIS — R931 Abnormal findings on diagnostic imaging of heart and coronary circulation: Secondary | ICD-10-CM

## 2022-05-08 DIAGNOSIS — E781 Pure hyperglyceridemia: Secondary | ICD-10-CM

## 2022-05-08 DIAGNOSIS — G4733 Obstructive sleep apnea (adult) (pediatric): Secondary | ICD-10-CM

## 2022-05-08 DIAGNOSIS — I6523 Occlusion and stenosis of bilateral carotid arteries: Secondary | ICD-10-CM

## 2022-05-08 DIAGNOSIS — E7800 Pure hypercholesterolemia, unspecified: Secondary | ICD-10-CM

## 2022-05-08 DIAGNOSIS — Z7901 Long term (current) use of anticoagulants: Secondary | ICD-10-CM | POA: Diagnosis not present

## 2022-05-08 DIAGNOSIS — G459 Transient cerebral ischemic attack, unspecified: Secondary | ICD-10-CM

## 2022-05-08 DIAGNOSIS — I2510 Atherosclerotic heart disease of native coronary artery without angina pectoris: Secondary | ICD-10-CM | POA: Diagnosis not present

## 2022-05-08 DIAGNOSIS — I10 Essential (primary) hypertension: Secondary | ICD-10-CM

## 2022-05-08 DIAGNOSIS — I4819 Other persistent atrial fibrillation: Secondary | ICD-10-CM

## 2022-05-08 DIAGNOSIS — E66813 Obesity, class 3: Secondary | ICD-10-CM

## 2022-05-08 NOTE — Progress Notes (Signed)
Date:  05/08/2022   ID:  [name], DOB 1955-06-19, MRN [id number]  PCP:  Townsend Roger, MD  Cardiologist:  Rex Kras, DO, [facility] (established care 08/13/2020)  Date: 05/08/22 Last Office Visit: 04/13/2022  Chief Complaint  Patient presents with   Chronic heart failure with preserved ejection fraction (HFp   Follow-up    Shortness of breath    HPI  Edward Weaver is a 67 y.o. male whose past medical history and cardiovascular risk factors include: Moderate coronary artery calcification (214, 66th percentile), chronic HFpEF, hx of COVID, OSA not CPAP, Atrial Fibrillation, TIA, Hypertension, Hyperlipidemia, bilateral carotid artery stenosis, obesity due to excess calories.  Patient is accompanied by his wife Edward Weaver at today's office visit.  Patient is being followed by the practice for management of persistent atrial fibrillation, chronic HFpEF, and carotid disease.  Patient presents today for sick visit due to shortness of breath.  His shortness of breath predominantly with effort related activities.  However, the intensity, frequency, duration remains stable.  He denies orthopnea, PND.  He has some bilateral lower extremity swelling.  He was also recently referred to sleep medicine by PCP consultation is still pending.  Since the last office visit he had a coronary calcium score which notes moderate CAC with a total score of 214, placing him at the 66 percentile.  Ordered PET/CT of the heart to evaluate for reversible ischemia which is still pending.  FUNCTIONAL STATUS: No structured exercise program or daily routine.   ALLERGIES: Allergies  Allergen Reactions   Penicillins Other (See Comments)    Syncope, hypotension    MEDICATION LIST PRIOR TO VISIT: Current Meds  Medication Sig   atorvastatin (LIPITOR) 40 MG tablet TAKE 1 TABLET (40 MG TOTAL) BY MOUTH AT BEDTIME.   dapagliflozin propanediol (FARXIGA) 10 MG TABS tablet Take 1 tablet (10 mg total) by mouth daily  before breakfast.   doxycycline (MONODOX) 100 MG capsule Take 1 capsule (100 mg total) by mouth 2 (two) times daily.   fenofibrate (TRICOR) 145 MG tablet Take 1 tablet (145 mg total) by mouth daily.   furosemide (LASIX) 20 MG tablet Take 1 tablet (20 mg total) by mouth every morning.   gabapentin (NEURONTIN) 300 MG capsule TAKE 1 CAPSULE (300 MG TOTAL) BY MOUTH 3 (THREE) TIMES DAILY.   metoprolol succinate (TOPROL-XL) 100 MG 24 hr tablet TAKE 1 TABLET BY MOUTH ONCE DAILY AT BEDTIME FOR ATRIAL FIBRILLATION   rivaroxaban (XARELTO) 20 MG TABS tablet Take 1 tablet (20 mg total) by mouth daily with supper.   sacubitril-valsartan (ENTRESTO) 97-103 MG Take 1 tablet by mouth 2 (two) times daily.     PAST MEDICAL HISTORY: Past Medical History:  Diagnosis Date   Arthritis    Atrial fibrillation ([place])    Bell's palsy    Carotid artery stenosis, asymptomatic, bilateral    Cerebrovascular disease    Chronic diastolic (congestive) heart failure (HCC)    Hiatal hernia    Hyperlipidemia    Hypertension    Neuropathy    OSA (obstructive sleep apnea)    Prediabetes    TIA (transient ischemic attack)    Nov and/or Dec 2014    PAST SURGICAL HISTORY: Past Surgical History:  Procedure Laterality Date   ESOPHAGUS SURGERY     KNEE SURGERY Right    1974   TONSILLECTOMY     WISDOM TOOTH EXTRACTION      FAMILY HISTORY: The patient family history includes Alzheimer's disease in his father  and paternal aunt; Atrial fibrillation in his son; Cancer - Other in his mother; Dementia in his paternal aunt; Healthy in his brother; Heart disease in his mother; Hypertension in his father, mother, and son; Stroke in his paternal aunt and son; Stroke (age of onset: 78) in his brother.  SOCIAL HISTORY:  The patient  reports that he has never smoked. He has never used smokeless tobacco. He reports that he does not drink alcohol and does not use drugs.  REVIEW OF SYSTEMS: Review of Systems  Constitutional:  Negative for chills and fever.  HENT:  Negative for hoarse voice and nosebleeds.   Eyes:  Negative for discharge, double vision and pain.  Cardiovascular:  Positive for dyspnea on exertion (chronic and stable.), orthopnea and paroxysmal nocturnal dyspnea. Negative for chest pain, claudication, leg swelling, near-syncope, palpitations and syncope.  Respiratory:  Positive for shortness of breath and snoring. Negative for hemoptysis.   Musculoskeletal:  Negative for muscle cramps and myalgias.  Gastrointestinal:  Negative for abdominal pain, constipation, diarrhea, hematemesis, hematochezia, melena, nausea and vomiting.  Neurological:  Negative for dizziness and light-headedness.   PHYSICAL EXAM:    05/08/2022   11:51 AM 05/06/2022    2:20 PM 04/13/2022    1:57 PM  Vitals with BMI  Height '5\' 6"'$  '5\' 6"'$  '5\' 6"'$   Weight 286 lbs 286 lbs 290 lbs 13 oz  BMI 46.18 [id number]  Systolic 0000000 [id number]  Diastolic 65 74 66  Pulse 51 50 77    CONSTITUTIONAL: Appears older than stated age, hemodynamically stable, no acute distress.    SKIN: Skin is warm and dry. No rash noted. No cyanosis. No pallor. No jaundice HEAD: Normocephalic and atraumatic.  EYES: No scleral icterus MOUTH/THROAT: Moist oral membranes.  NECK: Unable to evaluate JVP due to a short neck stature, no JVD present. No thyromegaly noted. No carotid bruits  CHEST Normal respiratory effort. No intercostal retractions  LUNGS: Clear to auscultation bilaterally with decreased breath sounds at the bases.  No stridor. No wheezes. No rales.  CARDIOVASCULAR: Irregularly irregular, variable S1-S2, no murmurs rubs or gallops appreciated. ABDOMINAL: Obese, soft, nontender, nondistended, positive bowel sounds in all 4 quadrants, no abdominal bruits appreciated.  No apparent ascites.  EXTREMITIES: +1 pitting edema bilaterally, +2 dorsalis pedis and posterior tibial pulses, poor nail hygiene.   HEMATOLOGIC: No significant bruising NEUROLOGIC: Oriented  to person, place, and time. Nonfocal. Normal muscle tone.  PSYCHIATRIC: Normal mood and affect. Normal behavior. Cooperative  CARDIAC DATABASE: EKG: 02/17/2022: Atrial fibrillation, rate controlled, 76 bpm, poor R wave progression, low voltage, without underlying injury pattern. 04/13/2022: Atrial fibrillation, 63 bpm, without underlying injury pattern.  Echocardiogram: 01/11/2017: LVEF 50-55%  08/22/2020: LVEF 45-50%, moderate LVH, elevated LAP, moderately dilated left atrium, mild MR, mild TR.  07/23/2021:  Left ventricle cavity is normal in size. Moderate concentric hypertrophy of the left ventricle. Normal LV systolic function with visual EF 50-55%.  Normal global wall motion. Unable to evaluate diastolic function due to atrial fibrillation.  Left atrial cavity is mildly dilated.  Mild to moderate mitral regurgitation.  Mild to moderate tricuspid regurgitation.  No evidence of pulmonary hypertension.  No significant change compared to previous study on 08/22/2020.  Stress Testing: Lexiscan Tetrofosmin stress test 08/28/2020: Stress EKG is non-diagnostic, as this is pharmacological stress test using Lexiscan. Stress EKG is non-diagnostic, as this is pharmacological stress test. Rest and stress EKG showed atrial fibrillation, no ST-T abnormality. SPECT images show normal stress myocardial perfusion. Decreased tracer uptake  in inferior myocardium at rest likely due diaphragmatic attenuation. Stress LVEF calculated 48%, but visually appears normal. Low risk study.  Heart Catheterization: None   Carotid Duplex: Carotid artery duplex 07/11/2021:  Duplex suggests stenosis in the right internal carotid artery (50-69%).  Duplex suggests stenosis in the left internal carotid artery (16-49%).  There is diffuse homogeneous plaque in the bilateral carotid arteries.  Antegrade right vertebral artery flow. Antegrade left vertebral artery flow.  No significant change since 08/22/2020. Follow up in  six months is appropriate if clinically indicated.  LABORATORY DATA:    Latest Ref Rng & Units 05/04/2022    2:47 PM 04/09/2022   12:51 PM 12/26/2021    7:13 PM  CBC  WBC 4.0 - 10.5 K/uL   6.4   Hemoglobin 13.2 - 17.1 g/dL 15.1  14.4  14.3   Hematocrit 38.5 - 50.0 % 44.7  43.2  42.9   Platelets 150 - 400 K/uL   286        Latest Ref Rng & Units 05/04/2022    2:47 PM 04/09/2022   12:51 PM 12/26/2021    7:13 PM  CMP  Glucose 65 - 99 mg/dL 106  104  108   BUN 7 - 25 mg/dL '18  18  15   '$ Creatinine 0.70 - 1.35 mg/dL 1.19  1.18  1.01   Sodium 135 - 146 mmol/L 140  141  139   Potassium 3.5 - 5.3 mmol/L 5.1  4.6  4.1   Chloride 98 - 110 mmol/L 103  105  105   CO2 20 - 32 mmol/L '30  26  26   '$ Calcium 8.6 - 10.3 mg/dL 9.2  9.2  9.3   Total Protein 6.1 - 8.1 g/dL  6.7  7.5   Total Bilirubin 0.2 - 1.2 mg/dL  1.0  0.7   Alkaline Phos 38 - 126 U/L   65   AST 10 - 35 U/L  11  15   ALT 9 - 46 U/L  22  28     Lipid Panel  Lab Results  Component Value Date   CHOL 137 05/04/2022   HDL 42 05/04/2022   LDLCALC 73 05/04/2022   LDLDIRECT 86 05/04/2022   TRIG 138 05/04/2022   CHOLHDL 3.3 05/04/2022    No components found for: "NTPROBNP" No results for input(s): "PROBNP" in the last 8760 hours.  Recent Labs    05/06/22 1604  TSH 1.740    BMP Recent Labs    12/26/21 1913 04/09/22 1251 05/04/22 1447  NA 139 141 140  K 4.1 4.6 5.1  CL 105 105 103  CO2 '26 26 30  '$ GLUCOSE 108* 104* 106*  BUN '15 18 18  '$ CREATININE 1.01 1.18 1.19  CALCIUM 9.3 9.2 9.2  GFRNONAA >60  --   --     HEMOGLOBIN A1C Lab Results  Component Value Date   HGBA1C 6.2 (H) 05/06/2022   MPG 125.5 01/10/2017   External Labs: Collected: 08/06/2020 provided by PCP Creatinine 1 mg/dL. eGFR: 79 mL/min per 1.73 m Lipid profile: Total cholesterol 218, triglycerides 269, HDL 45, LDL 132, non HDL 173 Hemoglobin A1c: 6 TSH: 2.06  IMPRESSION:    ICD-10-CM   1. Coronary atherosclerosis due to calcified  coronary lesion  I25.10    I25.84     2. Agatston CAC score 200-399  R93.1     3. Chronic heart failure with preserved ejection fraction (HFpEF) (HCC)  I50.32     4. Persistent  atrial fibrillation ([place])  I48.19     5. Long term (current) use of anticoagulants  Z79.01     6. Hypercholesterolemia  E78.00     7. OSA (obstructive sleep apnea)  G47.33     8. TIA (transient ischemic attack)  G45.9     9. Bilateral carotid artery stenosis  I65.23     10. Hypertriglyceridemia  E78.1     11. Dyslipidemia  E78.5     12. Benign hypertension  I10     13. Class 3 severe obesity due to excess calories with serious comorbidity and body mass index (BMI) of 45.0 to 49.9 in adult [facility])  E66.01    Z68.42        RECOMMENDATIONS: GEARY PUZIO is a 66 y.o. male whose past medical history and cardiac risk factors include: Moderate coronary artery calcification, chronic HFpEF, OSA not CPAP, Atrial Fibrillation, TIA, Hypertension, Hyperlipidemia, bilateral carotid artery stenosis, obesity due to excess calories.  Coronary atherosclerosis due to calcified coronary lesion Agatston CAC score 200-399 Total CAC 214, 66 percentile Will forego aspirin as he is on anticoagulation. Continue statin therapy and fenofibrate. Echocardiogram results noted above. Given moderate coronary artery calcification, obesity, multiple cardiovascular risk factors, and continued dyspnea on exertion shared decision was to proceed with ischemic workup.  He is prone to having attenuation artifact given his body habitus and therefore nuclear PET/CT is a ideal test of choice to evaluate for reversible ischemia.   Further recommendations to follow  Chronic heart failure with preserved ejection fraction (HFpEF) (HCC) Stage B, NYHA class II/III. Medications reconciled. No changes warranted at this time. Most recent labs from 04/2022 reviewed independently and noted above for further reference. Will hold off on up titration of  GDMT given his soft blood pressures.  Persistent atrial fibrillation (HCC) Rate control: Metoprolol. Rhythm control: N/A. Thromboembolic prophylaxis: Xarelto. CHA2DS2-VASc SCORE is 5 which correlates to 7.2 % risk of stroke per year. Patient does not endorse any evidence of bleeding. Diagnosed in 2019, per patient. In the past has refused to be on antiarrhythmic medications, catheter directed procedures, or cardioversion. However, one of his family friends recently underwent cardioversion therefore, patient is now considering that I would like to complete the ischemic workup.    Long term (current) use of anticoagulants Indication: Persistent atrial fibrillation. Currently on Xarelto. Does not endorse evidence of bleeding. Hemoglobin relatively at baseline.  Hypercholesterolemia Hypertriglyceridemia Continue statin therapy and fenofibrate. Both LDL and lipids have improved on current medical therapy.  OSA (obstructive sleep apnea) Recently received referral from PCP to be evaluated for possible sleep apnea/repeat sleep study. Patient's wife states that he easily falls asleep. He denies falling asleep at the red light. Excessive snoring at night.  TIA (transient ischemic attack) Bilateral carotid artery stenosis Educated him on the importance of secondary prevention given his history of TIA. Due for carotid duplex in April 2024 Continue statin therapy, fenofibrate  Benign hypertension Office blood pressures are soft. I have asked him to hold Lasix if SBP is less than 100 mmHg.  Class 3 severe obesity due to excess calories with serious comorbidity and body mass index (BMI) of 45.0 to 49.9 in adult [facility]) Body mass index is 46.16 kg/m. I reviewed with him importance of diet, regular physical activity/exercise, weight loss.   Patient is educated on the importance of increasing physical activity gradually as tolerated with a goal of moderate intensity exercise for 30 minutes a  day 5 days a week. However, not to  overexert until ischemic workup is complete  His overall dyspnea is multifactorial given his exposure to secondhand smoking, Atrial fibrillation, HFpEF, obesity, deconditioning.  Shared Decision Making/Informed Consent The risks [chest pain, shortness of breath, cardiac arrhythmias, dizziness, blood pressure fluctuations, myocardial infarction, stroke/transient ischemic attack, nausea, vomiting, allergic reaction, radiation exposure, metallic taste sensation and life-threatening complications (estimated to be 1 in 10,000)], benefits (risk stratification, diagnosing coronary artery disease, treatment guidance) and alternatives of a cardiac PET stress test were discussed in detail with Mr. Lindell and he agrees to proceed.  FINAL MEDICATION LIST END OF ENCOUNTER: No orders of the defined types were placed in this encounter.    There are no discontinued medications.    Current Outpatient Medications:    atorvastatin (LIPITOR) 40 MG tablet, TAKE 1 TABLET (40 MG TOTAL) BY MOUTH AT BEDTIME., Disp: 90 tablet, Rfl: 0   dapagliflozin propanediol (FARXIGA) 10 MG TABS tablet, Take 1 tablet (10 mg total) by mouth daily before breakfast., Disp: 90 tablet, Rfl: 0   doxycycline (MONODOX) 100 MG capsule, Take 1 capsule (100 mg total) by mouth 2 (two) times daily., Disp: 20 capsule, Rfl: 0   fenofibrate (TRICOR) 145 MG tablet, Take 1 tablet (145 mg total) by mouth daily., Disp: 90 tablet, Rfl: 0   furosemide (LASIX) 20 MG tablet, Take 1 tablet (20 mg total) by mouth every morning., Disp: 90 tablet, Rfl: 0   gabapentin (NEURONTIN) 300 MG capsule, TAKE 1 CAPSULE (300 MG TOTAL) BY MOUTH 3 (THREE) TIMES DAILY., Disp: 21 capsule, Rfl: 0   metoprolol succinate (TOPROL-XL) 100 MG 24 hr tablet, TAKE 1 TABLET BY MOUTH ONCE DAILY AT BEDTIME FOR ATRIAL FIBRILLATION, Disp: 30 tablet, Rfl: 6   rivaroxaban (XARELTO) 20 MG TABS tablet, Take 1 tablet (20 mg total) by mouth daily with supper.,  Disp: 90 tablet, Rfl: 3   sacubitril-valsartan (ENTRESTO) 97-103 MG, Take 1 tablet by mouth 2 (two) times daily., Disp: 180 tablet, Rfl: 3  No orders of the defined types were placed in this encounter.   There are no Patient Instructions on file for this visit.   --Continue cardiac medications as reconciled in final medication list. --Return in about 2 months (around 07/14/2022) for Follow up, Carotid disease, Review test results. Or sooner if needed. --Continue follow-up with your primary care physician regarding the management of your other chronic comorbid conditions.  Patient's questions and concerns were addressed to his satisfaction. He voices understanding of the instructions provided during this encounter.   This note was created using a voice recognition software as a result there may be grammatical errors inadvertently enclosed that do not reflect the nature of this encounter. Every attempt is made to correct such errors.   Rex Kras, [place], [facility]  Pager: [phone] Office: [phone]

## 2022-05-11 NOTE — Progress Notes (Signed)
Called patient no answer left a vm

## 2022-05-11 NOTE — Progress Notes (Signed)
Patient called back and was to inform him about his labs. Patient mention he went to see his PCP and he does not have flu or covid

## 2022-05-11 NOTE — Progress Notes (Signed)
Patient called.  Mailed letter.   His labs look good.  Prediabetes is stable.

## 2022-05-25 ENCOUNTER — Ambulatory Visit: Payer: Medicare Other

## 2022-05-25 ENCOUNTER — Other Ambulatory Visit: Payer: Self-pay | Admitting: Cardiology

## 2022-05-25 ENCOUNTER — Ambulatory Visit: Payer: Medicare Other | Admitting: Cardiology

## 2022-05-25 DIAGNOSIS — I4819 Other persistent atrial fibrillation: Secondary | ICD-10-CM

## 2022-06-01 ENCOUNTER — Encounter: Payer: Self-pay | Admitting: Internal Medicine

## 2022-06-01 ENCOUNTER — Ambulatory Visit: Payer: Medicare Other | Admitting: Internal Medicine

## 2022-06-01 ENCOUNTER — Telehealth (HOSPITAL_COMMUNITY): Payer: Self-pay | Admitting: *Deleted

## 2022-06-01 VITALS — BP 138/80 | HR 79 | Temp 97.1°F | Resp 16 | Ht 66.0 in | Wt 283.2 lb

## 2022-06-01 DIAGNOSIS — R0981 Nasal congestion: Secondary | ICD-10-CM | POA: Diagnosis not present

## 2022-06-01 DIAGNOSIS — J45909 Unspecified asthma, uncomplicated: Secondary | ICD-10-CM

## 2022-06-01 DIAGNOSIS — R070 Pain in throat: Secondary | ICD-10-CM

## 2022-06-01 DIAGNOSIS — J40 Bronchitis, not specified as acute or chronic: Secondary | ICD-10-CM | POA: Diagnosis not present

## 2022-06-01 MED ORDER — FLUTICASONE PROPIONATE 50 MCG/ACT NA SUSP: 1.0000 | Freq: Two times a day (BID) | NASAL | 0 refills | Status: DC

## 2022-06-01 MED ORDER — PREDNISONE 20 MG PO TABS: 20.0000 mg | ORAL_TABLET | Freq: Every day | ORAL | 0 refills | Status: AC

## 2022-06-01 MED ORDER — ALBUTEROL SULFATE HFA 108 (90 BASE) MCG/ACT IN AERS: 2.0000 | INHALATION_SPRAY | Freq: Four times a day (QID) | RESPIRATORY_TRACT | 0 refills | Status: AC | PRN

## 2022-06-01 MED ORDER — DOXYCYCLINE MONOHYDRATE 100 MG PO CAPS: 100.0000 mg | ORAL_CAPSULE | Freq: Two times a day (BID) | ORAL | 0 refills | Status: DC

## 2022-06-01 MED ORDER — HYDROCOD POLI-CHLORPHE POLI ER 10-8 MG/5ML PO SUER: 5.0000 mL | Freq: Two times a day (BID) | ORAL | 0 refills | Status: DC | PRN

## 2022-06-01 NOTE — Telephone Encounter (Signed)
Reaching out to patient to offer assistance regarding upcoming cardiac imaging study; pt verbalizes understanding of appt date/time, parking situation and where to check in, pre-test NPO status and verified current allergies; name and call back number provided for further questions should they arise  Denine Brotz RN Navigator Cardiac Imaging [facility] [phone] office [phone] cell  Patient aware to avoid caffeine 12 hours prior to his cardiac PET scan. 

## 2022-06-01 NOTE — Assessment & Plan Note (Signed)
He has bronchitis with some mild wheezing today with probable reactive airway disease.  I am going to give him prednisone 20mg  daily x 5 days with an albuterol inhaler and start him on doxycycline 100mg  BID.  He also has sinus congestion where we will give him flonase.  He wants something for cough.  I want him to start mucinex and he can use tussionex for cough at night.  His COVID-19 and flu tests were negative.  Continue supportive care.

## 2022-06-01 NOTE — Progress Notes (Signed)
Office Visit  Subjective   Patient ID: VIDEL NAGER   DOB: [birth year]   Age: 67 y.o.   MRN: [id number]   Chief Complaint Chief Complaint  Patient presents with   Acute Visit    Cough and congestion     History of Present Illness The patient is a 67 year old male who presents with upper respiratory tract symptoms which began 4 days ago.  He states this started as sinus congestion with green nasal discharge with post nasal drip with sore throat.  He states within a few days this moved into his chest with chest congestion with cough productive of white sputum.  He is having some mild wheezing and SOB as well.  He has no additional complaints. He denies any body aches, fever, chills, headache, weakness, nausea, vomiting, or diarrhea. Alleviating factors: none.  He has not had the flu vaccine or any of the COVID-19 vaccines.  The patient's past medical history is noncontributory.  He used to have an albuterol inhaler that cardiology gave him years ago for "wheezing" but he does not have a history of asthma or COPD.     Past Medical History Past Medical History:  Diagnosis Date   Arthritis    Atrial fibrillation (HCC)    Bell's palsy    Carotid artery stenosis, asymptomatic, bilateral    Cerebrovascular disease    Chronic diastolic (congestive) heart failure (HCC)    Hiatal hernia    Hyperlipidemia    Hypertension    Neuropathy    OSA (obstructive sleep apnea)    Prediabetes    TIA (transient ischemic attack)    Nov and/or Dec 2014     Allergies Allergies  Allergen Reactions   Penicillins Other (See Comments)    Syncope, hypotension     Medications  Current Outpatient Medications:    atorvastatin (LIPITOR) 40 MG tablet, TAKE 1 TABLET (40 MG TOTAL) BY MOUTH AT BEDTIME., Disp: 90 tablet, Rfl: 0   dapagliflozin propanediol (FARXIGA) 10 MG TABS tablet, Take 1 tablet (10 mg total) by mouth daily before breakfast., Disp: 90 tablet, Rfl: 0   fenofibrate (TRICOR) 145 MG tablet,  Take 1 tablet (145 mg total) by mouth daily., Disp: 90 tablet, Rfl: 0   furosemide (LASIX) 20 MG tablet, Take 1 tablet (20 mg total) by mouth every morning., Disp: 90 tablet, Rfl: 0   gabapentin (NEURONTIN) 300 MG capsule, TAKE 1 CAPSULE (300 MG TOTAL) BY MOUTH 3 (THREE) TIMES DAILY., Disp: 21 capsule, Rfl: 0   metoprolol succinate (TOPROL-XL) 100 MG 24 hr tablet, TAKE 1 TABLET BY MOUTH ONCE DAILY AT BEDTIME FOR ATRIAL FIBRILLATION, Disp: 30 tablet, Rfl: 6   rivaroxaban (XARELTO) 20 MG TABS tablet, Take 1 tablet (20 mg total) by mouth daily with supper., Disp: 90 tablet, Rfl: 3   sacubitril-valsartan (ENTRESTO) 97-103 MG, Take 1 tablet by mouth 2 (two) times daily., Disp: 180 tablet, Rfl: 3   Review of Systems Review of Systems  Constitutional:  Negative for chills and fever.  Respiratory:  Positive for cough, sputum production, shortness of breath and wheezing.   Cardiovascular:  Negative for chest pain, palpitations and leg swelling.  Gastrointestinal:  Negative for abdominal pain, constipation, diarrhea, nausea and vomiting.       Objective:    Vitals BP 138/80   Pulse 79   Temp (!) 97.1 F (36.2 C)   Resp 16   Ht 5\' 6"  (1.676 m)   Wt 283 lb 3.2 oz (128.5 kg)  SpO2 97%   BMI 45.71 kg/m    Physical Examination Physical Exam Constitutional:      Appearance: Normal appearance. He is not ill-appearing.  HENT:     Right Ear: Tympanic membrane, ear canal and external ear normal.     Left Ear: Tympanic membrane, ear canal and external ear normal.     Mouth/Throat:     Mouth: Mucous membranes are moist.     Pharynx: Oropharynx is clear. No oropharyngeal exudate or posterior oropharyngeal erythema.  Cardiovascular:     Rate and Rhythm: Normal rate and regular rhythm.     Pulses: Normal pulses.     Heart sounds: No murmur heard.    No friction rub. No gallop.  Pulmonary:     Effort: Pulmonary effort is normal. No respiratory distress.     Breath sounds: Wheezing present. No  rhonchi or rales.     Comments: He has very mild wheezing today Abdominal:     General: Abdomen is flat. Bowel sounds are normal. There is no distension.     Palpations: Abdomen is soft.     Tenderness: There is no abdominal tenderness.  Musculoskeletal:     Right lower leg: No edema.     Left lower leg: No edema.  Skin:    General: Skin is warm and dry.     Findings: No rash.  Neurological:     Mental Status: He is alert.        Assessment & Plan:   Bronchitis He has bronchitis with some mild wheezing today with probable reactive airway disease.  I am going to give him prednisone 20mg  daily x 5 days with an albuterol inhaler and start him on doxycycline 100mg  BID.  He also has sinus congestion where we will give him flonase.  He wants something for cough.  I want him to start mucinex and he can use tussionex for cough at night.  His COVID-19 and flu tests were negative.  Continue supportive care.    No follow-ups on file.   Townsend Roger, MD

## 2022-06-03 ENCOUNTER — Ambulatory Visit (HOSPITAL_COMMUNITY): Admission: RE | Admit: 2022-06-03 | Payer: Medicare Other | Source: Ambulatory Visit

## 2022-06-11 ENCOUNTER — Telehealth: Payer: Self-pay

## 2022-06-11 NOTE — Progress Notes (Signed)
[facility]) [place]   06/11/2022  SMYAN EHRMAN Jul 23, 1955 [id number]  Reason for referral: Medication Assistance  Referral source:  [place] Technician Current insurance: [facility]  Reason for call: Medication Assistance   Outreach:  Unsuccessful telephone call attempt #1 to patient.   HIPAA compliant voicemail left requesting a return call  Plan:  -I will make another outreach attempt to patient within 3-4 business days.  Thank you for allowing [facility] pharmacy to be a part of this patient's care.  Reed Breech, PharmD Clinical Pharmacist  [place] [phone]

## 2022-06-15 ENCOUNTER — Telehealth: Payer: Self-pay

## 2022-06-15 NOTE — Progress Notes (Signed)
[facility]) [place]   06/15/2022  Edward Weaver 10-03-1955 [id number]   Reason for referral: Medication Assistance  Referral source:  [place] Technician Current insurance: [facility]  Reason for call: Medication Assistance   Outreach:  Unsuccessful telephone call attempt #2 to patient.   HIPAA compliant voicemail left requesting a return call  Plan:  -I will make another outreach attempt to patient within 3-4 business days.   Thanks,  Reed Breech, [place] [phone]

## 2022-06-19 ENCOUNTER — Other Ambulatory Visit: Payer: Medicare Other

## 2022-07-02 ENCOUNTER — Encounter: Payer: Self-pay | Admitting: *Deleted

## 2022-07-10 ENCOUNTER — Telehealth: Payer: Self-pay | Admitting: Pharmacy Technician

## 2022-07-10 DIAGNOSIS — Z5986 Financial insecurity: Secondary | ICD-10-CM

## 2022-07-10 NOTE — Progress Notes (Addendum)
[facility] Customer service manager [facility]) Quality Pharmacy Team [facility]   07/10/2022  Edward Weaver 03/01/1956 [id number]  Reason for referral: Medication Assistance  Referral source:  [facility] CPhT [place] Age from Medication Adherence report Current insurance: [facility]  Reason for call: Medication Assistance  Outreach:  Successful telephone call attempt #2 to patient.    Patient informs this was not a good time to speak about patient assistance and requested a call back on Monday 07/13/22 to discuss  Plan:  -I will make another outreach attempt to patient within 3-4 business days.  Thank you for allowing [facility] pharmacy to be a part of this patient's care.   ADDENDUM 07/13/22 Unsuccessful outreach to patient. HIPAA compliant v/m left. Will try again in 3-4 business days.  ADDENDUM 07/17/22 Successful telephone call attempt #3 to patient.  Patient informs this was not a good time to speak about patient assistance and requested a call back on Monday 07/20/22 to discuss. Will make final outreach attempt as outlined.  Alyxandra Tenbrink P. Korra Christine, CPhT [facility] [employer]  [phone]

## 2022-07-14 ENCOUNTER — Telehealth (HOSPITAL_COMMUNITY): Payer: Self-pay | Admitting: *Deleted

## 2022-07-14 ENCOUNTER — Ambulatory Visit: Payer: Medicare Other | Admitting: Cardiology

## 2022-07-14 NOTE — Telephone Encounter (Signed)
Reaching out to patient to offer assistance regarding upcoming cardiac imaging study; pt verbalizes understanding of appt date/time, parking situation and where to check in, pre-test NPO status and verified current allergies; name and call back number provided for further questions should they arise  Macallan Ord RN Navigator Cardiac Imaging [facility] [phone] office [phone] cell  Patient aware to avoid caffeine 12 hours prior to his cardiac PET scan. 

## 2022-07-15 ENCOUNTER — Ambulatory Visit (HOSPITAL_COMMUNITY)
Admission: RE | Admit: 2022-07-15 | Discharge: 2022-07-15 | Disposition: A | Discharge status: Home / Self Care | Payer: Medicare Other | Source: Ambulatory Visit | Attending: Cardiology | Admitting: Cardiology

## 2022-07-15 DIAGNOSIS — G4733 Obstructive sleep apnea (adult) (pediatric): Secondary | ICD-10-CM | POA: Diagnosis not present

## 2022-07-15 DIAGNOSIS — E7800 Pure hypercholesterolemia, unspecified: Secondary | ICD-10-CM | POA: Diagnosis not present

## 2022-07-15 DIAGNOSIS — I5032 Chronic diastolic (congestive) heart failure: Secondary | ICD-10-CM | POA: Diagnosis not present

## 2022-07-15 DIAGNOSIS — I2510 Atherosclerotic heart disease of native coronary artery without angina pectoris: Secondary | ICD-10-CM | POA: Insufficient documentation

## 2022-07-15 DIAGNOSIS — I2584 Coronary atherosclerosis due to calcified coronary lesion: Secondary | ICD-10-CM | POA: Insufficient documentation

## 2022-07-15 DIAGNOSIS — I4819 Other persistent atrial fibrillation: Secondary | ICD-10-CM | POA: Insufficient documentation

## 2022-07-15 DIAGNOSIS — G459 Transient cerebral ischemic attack, unspecified: Secondary | ICD-10-CM | POA: Insufficient documentation

## 2022-07-15 LAB — NM PET CT CARDIAC PERFUSION MULTI W/ABSOLUTE BLOODFLOW
Base ST Depression (mm): 0 mm
LV dias vol: 147 mL (ref 62–150)
LV sys vol: 78 mL
MBFR: 2.12
Nuc Rest EF: 47 %
Nuc Stress EF: 42 %
Rest MBF: 0.6 ml/g/min
Rest Nuclear Isotope Dose: 30 mCi
ST Depression (mm): 0 mm
Stress MBF: 1.27 ml/g/min
Stress Nuclear Isotope Dose: 30 mCi

## 2022-07-15 MED ORDER — RUBIDIUM RB82 GENERATOR (RUBYFILL)
30.0000 | PACK | Freq: Once | INTRAVENOUS | Status: AC
  Administered 2022-07-15: 30 via INTRAVENOUS

## 2022-07-15 MED ORDER — REGADENOSON 0.4 MG/5ML IV SOLN
0.4000 mg | Freq: Once | INTRAVENOUS | Status: AC
  Administered 2022-07-15: 0.4 mg via INTRAVENOUS

## 2022-07-15 MED ORDER — REGADENOSON 0.4 MG/5ML IV SOLN
INTRAVENOUS | Status: AC
  Filled 2022-07-15: qty 5

## 2022-07-20 NOTE — Progress Notes (Signed)
Called patient, Edward Weaver, Edward Weaver

## 2022-07-20 NOTE — Progress Notes (Signed)
Called patient, NA, LMAM confirming appointment with ST on the 14th.

## 2022-07-21 ENCOUNTER — Ambulatory Visit: Payer: Medicare Other

## 2022-07-21 DIAGNOSIS — I6523 Occlusion and stenosis of bilateral carotid arteries: Secondary | ICD-10-CM

## 2022-07-27 ENCOUNTER — Ambulatory Visit: Payer: Medicare Other | Admitting: Cardiology

## 2022-07-28 ENCOUNTER — Ambulatory Visit: Payer: Medicare Other | Admitting: Cardiology

## 2022-07-28 ENCOUNTER — Encounter: Payer: Self-pay | Admitting: Cardiology

## 2022-07-28 VITALS — BP 109/80 | HR 97 | Resp 18 | Ht 66.0 in | Wt 283.4 lb

## 2022-07-28 DIAGNOSIS — E781 Pure hyperglyceridemia: Secondary | ICD-10-CM

## 2022-07-28 DIAGNOSIS — I4819 Other persistent atrial fibrillation: Secondary | ICD-10-CM

## 2022-07-28 DIAGNOSIS — R931 Abnormal findings on diagnostic imaging of heart and coronary circulation: Secondary | ICD-10-CM

## 2022-07-28 DIAGNOSIS — E7800 Pure hypercholesterolemia, unspecified: Secondary | ICD-10-CM

## 2022-07-28 DIAGNOSIS — G459 Transient cerebral ischemic attack, unspecified: Secondary | ICD-10-CM

## 2022-07-28 DIAGNOSIS — I2510 Atherosclerotic heart disease of native coronary artery without angina pectoris: Secondary | ICD-10-CM | POA: Diagnosis not present

## 2022-07-28 DIAGNOSIS — G4733 Obstructive sleep apnea (adult) (pediatric): Secondary | ICD-10-CM

## 2022-07-28 DIAGNOSIS — Z7901 Long term (current) use of anticoagulants: Secondary | ICD-10-CM

## 2022-07-28 DIAGNOSIS — I6523 Occlusion and stenosis of bilateral carotid arteries: Secondary | ICD-10-CM

## 2022-07-28 DIAGNOSIS — I5032 Chronic diastolic (congestive) heart failure: Secondary | ICD-10-CM

## 2022-07-28 MED ORDER — ENTRESTO 49-51 MG PO TABS
1.0000 | ORAL_TABLET | Freq: Two times a day (BID) | ORAL | 0 refills | Status: AC
Start: 2022-07-28 — End: 2022-10-26

## 2022-07-28 MED ORDER — OZEMPIC (0.25 OR 0.5 MG/DOSE) 2 MG/1.5ML ~~LOC~~ SOPN
0.2500 mg | PEN_INJECTOR | SUBCUTANEOUS | 0 refills | Status: AC
Start: 2022-07-28 — End: 2022-09-16

## 2022-07-28 MED ORDER — NEXLIZET 180-10 MG PO TABS
1.0000 | ORAL_TABLET | Freq: Every day | ORAL | 0 refills | Status: AC
Start: 2022-07-28 — End: 2022-10-26

## 2022-07-28 MED ORDER — FENOFIBRATE 145 MG PO TABS
145.0000 mg | ORAL_TABLET | Freq: Every day | ORAL | 0 refills | Status: DC
Start: 2022-07-28 — End: 2023-01-12

## 2022-07-28 NOTE — Progress Notes (Signed)
Date:  07/28/2022   ID:  Edward Weaver, DOB March 11, 1956, MRN [id number]  PCP:  Edward Fat, MD  Cardiologist:  Edward Lerner, DO, [facility] (established care 08/13/2020)  Date: 07/28/22 Last Office Visit: 05/08/2022  Chief Complaint  Patient presents with   carotid disease   Follow-up    HPI  Edward Weaver is a 67 y.o. male whose past medical history and cardiovascular risk factors include: Moderate coronary artery calcification (214, 66th percentile), chronic HFpEF, hx of COVID, OSA not CPAP, Atrial Fibrillation, TIA, Hypertension, Hyperlipidemia, bilateral carotid artery stenosis, obesity due to excess calories.  Patient is accompanied by his wife [name] at today's office visit.  Patient is being followed by the practice for his underlying persistent atrial fibrillation, chronic HFpEF, and carotid disease.  He presents today to discuss his results of the cardiac PET/CT and carotid duplex since last office visit.  Clinically he denies any anginal chest pain or heart failure symptoms.  His blood pressures are trending soft but is asymptomatic.  Results of the carotid duplex and cardiac PET/CT reviewed with him in detail and noted below for further reference.  Patient still has not followed up with sleep medicine as requested by myself and consult was provided by PCP.  In the past he voiced interest in undergoing direct-current cardioversion to restore normal sinus rhythm but wants to hold off for now.  His wife states that he did have a cardioversion in the past when he was diagnosed with A-fib but soon after reverted back to A-fib.  FUNCTIONAL STATUS: No structured exercise program or daily routine.   ALLERGIES: Allergies  Allergen Reactions   Penicillins Other (See Comments)    Syncope, hypotension    MEDICATION LIST PRIOR TO VISIT: Current Meds  Medication Sig   albuterol (VENTOLIN HFA) 108 (90 Base) MCG/ACT inhaler Inhale 2 puffs into the lungs every 6 (six) hours as  needed for wheezing or shortness of breath.   atorvastatin (LIPITOR) 40 MG tablet TAKE 1 TABLET (40 MG TOTAL) BY MOUTH AT BEDTIME.   Bempedoic Acid-Ezetimibe (NEXLIZET) 180-10 MG TABS Take 1 tablet by mouth daily at 12 noon.   fluticasone (FLONASE ALLERGY RELIEF) 50 MCG/ACT nasal spray Place 1 spray into both nostrils in the morning and at bedtime.   furosemide (LASIX) 20 MG tablet Take 1 tablet (20 mg total) by mouth every morning.   gabapentin (NEURONTIN) 300 MG capsule TAKE 1 CAPSULE (300 MG TOTAL) BY MOUTH 3 (THREE) TIMES DAILY.   metoprolol succinate (TOPROL-XL) 100 MG 24 hr tablet TAKE 1 TABLET BY MOUTH ONCE DAILY AT BEDTIME FOR ATRIAL FIBRILLATION   rivaroxaban (XARELTO) 20 MG TABS tablet Take 1 tablet (20 mg total) by mouth daily with supper.   sacubitril-valsartan (ENTRESTO) 49-51 MG Take 1 tablet by mouth 2 (two) times daily.   Semaglutide,0.25 or 0.5MG /DOS, (OZEMPIC, 0.25 OR 0.5 MG/DOSE,) 2 MG/1.5ML SOPN Inject 0.25 mg into the skin once a week for 8 doses.   [DISCONTINUED] sacubitril-valsartan (ENTRESTO) 97-103 MG Take 1 tablet by mouth 2 (two) times daily.     PAST MEDICAL HISTORY: Past Medical History:  Diagnosis Date   Arthritis    Atrial fibrillation (HCC)    Bell's palsy    Carotid artery stenosis, asymptomatic, bilateral    Cerebrovascular disease    Chronic diastolic (congestive) heart failure (HCC)    Hiatal hernia    Hyperlipidemia    Hypertension    Neuropathy    OSA (obstructive sleep apnea)  Prediabetes    TIA (transient ischemic attack)    Nov and/or Dec 2014    PAST SURGICAL HISTORY: Past Surgical History:  Procedure Laterality Date   ESOPHAGUS SURGERY     KNEE SURGERY Right    1974   TONSILLECTOMY     WISDOM TOOTH EXTRACTION      FAMILY HISTORY: The patient family history includes Alzheimer's disease in his father and paternal aunt; Atrial fibrillation in his son; Cancer - Other in his mother; Dementia in his paternal aunt; Healthy in his  brother; Heart disease in his mother; Hypertension in his father, mother, and son; Stroke in his paternal aunt and son; Stroke (age of onset: 52) in his brother.  SOCIAL HISTORY:  The patient  reports that he has never smoked. He has never used smokeless tobacco. He reports that he does not drink alcohol and does not use drugs.  REVIEW OF SYSTEMS: Review of Systems  Constitutional: Negative for chills and fever.  HENT:  Negative for hoarse voice and nosebleeds.   Eyes:  Negative for discharge, double vision and pain.  Cardiovascular:  Positive for dyspnea on exertion (chronic and stable.), orthopnea and paroxysmal nocturnal dyspnea. Negative for chest pain, claudication, leg swelling, near-syncope, palpitations and syncope.  Respiratory:  Positive for shortness of breath and snoring. Negative for hemoptysis.   Musculoskeletal:  Negative for muscle cramps and myalgias.  Gastrointestinal:  Negative for abdominal pain, constipation, diarrhea, hematemesis, hematochezia, melena, nausea and vomiting.  Neurological:  Negative for dizziness and light-headedness.   PHYSICAL EXAM:    07/28/2022    3:17 PM 07/15/2022   10:41 AM 07/15/2022   10:40 AM  Vitals with BMI  Height 5\' 6"     Weight 283 lbs 6 oz    BMI 45.76    Systolic 109 128 161  Diastolic 80 77 68  Pulse 97 66 57    CONSTITUTIONAL: Appears older than stated age, hemodynamically stable, no acute distress.    SKIN: Skin is warm and dry. No rash noted. No cyanosis. No pallor. No jaundice HEAD: Normocephalic and atraumatic.  EYES: No scleral icterus MOUTH/THROAT: Moist oral membranes.  NECK: Unable to evaluate JVP due to a short neck stature, no JVD present. No thyromegaly noted. No carotid bruits  CHEST Normal respiratory effort. No intercostal retractions  LUNGS: Clear to auscultation bilaterally with decreased breath sounds at the bases.  No stridor. No wheezes. No rales.  CARDIOVASCULAR: Irregularly irregular, variable S1-S2, no  murmurs rubs or gallops appreciated. ABDOMINAL: Obese, soft, nontender, nondistended, positive bowel sounds in all 4 quadrants, no abdominal bruits appreciated.  No apparent ascites.  EXTREMITIES: +1 pitting edema bilaterally, +2 dorsalis pedis and posterior tibial pulses, poor nail hygiene.   HEMATOLOGIC: No significant bruising NEUROLOGIC: Oriented to person, place, and time. Nonfocal. Normal muscle tone.  PSYCHIATRIC: Normal mood and affect. Normal behavior. Cooperative  CARDIAC DATABASE: EKG: 02/17/2022: Atrial fibrillation, rate controlled, 76 bpm, poor R wave progression, low voltage, without underlying injury pattern. 04/13/2022: Atrial fibrillation, 63 bpm, without underlying injury pattern. Jul 28, 2022: Atrial fibrillation, 66 bpm, without underlying ischemia or injury pattern.  Echocardiogram: 01/11/2017: LVEF 50-55%  08/22/2020: LVEF 45-50%, moderate LVH, elevated LAP, moderately dilated left atrium, mild MR, mild TR.  07/23/2021:  Left ventricle cavity is normal in size. Moderate concentric hypertrophy of the left ventricle. Normal LV systolic function with visual EF 50-55%.  Normal global wall motion.  Unable to evaluate diastolic function due to atrial fibrillation.  Left atrial cavity is mildly  dilated.  Mild to moderate mitral regurgitation.  Mild to moderate tricuspid regurgitation.  No evidence of pulmonary hypertension.  No significant change compared to previous study on 08/22/2020.  Stress Testing: Cardiac PET/CT  07/15/2022   LV perfusion is normal. There is no evidence of ischemia. There is no evidence of infarction.   Rest left ventricular function is abnormal. Rest EF: 47 %. Stress EF: 42 %. End diastolic cavity size is mildly enlarged. End systolic cavity size is mildly enlarged.   Myocardial blood flow was computed to be 0.77ml/g/min at rest and 1.105ml/g/min at stress. Global myocardial blood flow reserve was 2.12 and was normal.   Coronary calcium was present  on the attenuation correction CT images. Moderate coronary calcifications were present. Coronary calcifications were present in the left anterior descending artery, left circumflex artery and right coronary artery distribution(s).   Findings are consistent with no ischemia and no infarction. The study is low risk.   Prior echo suggested resting EF 50-55% suggest updating given abnormal rest/stress EF on this study  Heart Catheterization: None   Carotid Duplex: 07/11/2021:  Duplex suggests stenosis in the right internal carotid artery (50-69%).  Duplex suggests stenosis in the left internal carotid artery (16-49%).  There is diffuse homogeneous plaque in the bilateral carotid arteries.  Antegrade right vertebral artery flow. Antegrade left vertebral artery flow.  No significant change since 08/22/2020. Follow up in six months is appropriate if clinically indicated.  07/21/2022: Duplex suggests stenosis in the right internal carotid artery (50-69%). Duplex suggests stenosis in the left internal carotid artery (1-15%).  < 50% stenosis in the left external carotid artery. Antegrade right vertebral artery flow. Antegrade left vertebral artery flow. Compared to the study done on 07/03/2021, mild regression of stenosis in the left ICA (16-49% stenosis).  Overall no significant change. Follow up in six months is appropriate if clinically indicated.   LABORATORY DATA:    Latest Ref Rng & Units 05/04/2022    2:47 PM 04/09/2022   12:51 PM 12/26/2021    7:13 PM  CBC  WBC 4.0 - 10.5 K/uL   6.4   Hemoglobin 13.2 - 17.1 g/dL 60.4  54.0  98.1   Hematocrit 38.5 - 50.0 % 44.7  43.2  42.9   Platelets 150 - 400 K/uL   286        Latest Ref Rng & Units 05/04/2022    2:47 PM 04/09/2022   12:51 PM 12/26/2021    7:13 PM  CMP  Glucose 65 - 99 mg/dL 191  478  295   BUN 7 - 25 mg/dL 18  18  15    Creatinine 0.70 - 1.35 mg/dL 6.21  3.08  6.57   Sodium 135 - 146 mmol/L 140  141  139   Potassium 3.5 - 5.3  mmol/L 5.1  4.6  4.1   Chloride 98 - 110 mmol/L 103  105  105   CO2 20 - 32 mmol/L 30  26  26    Calcium 8.6 - 10.3 mg/dL 9.2  9.2  9.3   Total Protein 6.1 - 8.1 g/dL  6.7  7.5   Total Bilirubin 0.2 - 1.2 mg/dL  1.0  0.7   Alkaline Phos 38 - 126 U/L   65   AST 10 - 35 U/L  11  15   ALT 9 - 46 U/L  22  28     Lipid Panel  Lab Results  Component Value Date   CHOL 137 05/04/2022  HDL 42 05/04/2022   LDLCALC 73 05/04/2022   LDLDIRECT 86 05/04/2022   TRIG 138 05/04/2022   CHOLHDL 3.3 05/04/2022    No components found for: "NTPROBNP" No results for input(s): "PROBNP" in the last 8760 hours.  Recent Labs    05/06/22 1604  TSH 1.740    BMP Recent Labs    12/26/21 1913 04/09/22 1251 05/04/22 1447  NA 139 141 140  K 4.1 4.6 5.1  CL 105 105 103  CO2 26 26 30   GLUCOSE 108* 104* 106*  BUN 15 18 18   CREATININE 1.01 1.18 1.19  CALCIUM 9.3 9.2 9.2  GFRNONAA >60  --   --     HEMOGLOBIN A1C Lab Results  Component Value Date   HGBA1C 6.2 (H) 05/06/2022   MPG 125.5 01/10/2017   External Labs: Collected: 08/06/2020 provided by PCP Creatinine 1 mg/dL. eGFR: 79 mL/min per 1.73 m Lipid profile: Total cholesterol 218, triglycerides 269, HDL 45, LDL 132, non HDL 173 Hemoglobin A1c: 6 TSH: 2.06  IMPRESSION:    ICD-10-CM   1. Persistent atrial fibrillation (HCC)  I48.19 EKG 12-Lead    2. Long term (current) use of anticoagulants  Z79.01     3. Hypercholesterolemia  E78.00 Semaglutide,0.25 or 0.5MG /DOS, (OZEMPIC, 0.25 OR 0.5 MG/DOSE,) 2 MG/1.5ML SOPN    Bempedoic Acid-Ezetimibe (NEXLIZET) 180-10 MG TABS    4. OSA (obstructive sleep apnea)  G47.33     5. Chronic heart failure with preserved ejection fraction (HFpEF) (HCC)  I50.32 Semaglutide,0.25 or 0.5MG /DOS, (OZEMPIC, 0.25 OR 0.5 MG/DOSE,) 2 MG/1.5ML SOPN    sacubitril-valsartan (ENTRESTO) 49-51 MG    6. Agatston CAC score 200-399  R93.1 Semaglutide,0.25 or 0.5MG /DOS, (OZEMPIC, 0.25 OR 0.5 MG/DOSE,) 2 MG/1.5ML SOPN     Bempedoic Acid-Ezetimibe (NEXLIZET) 180-10 MG TABS    7. Coronary atherosclerosis due to calcified coronary lesion  I25.10 Semaglutide,0.25 or 0.5MG /DOS, (OZEMPIC, 0.25 OR 0.5 MG/DOSE,) 2 MG/1.5ML SOPN   I25.84 Bempedoic Acid-Ezetimibe (NEXLIZET) 180-10 MG TABS    8. TIA (transient ischemic attack)  G45.9 Semaglutide,0.25 or 0.5MG /DOS, (OZEMPIC, 0.25 OR 0.5 MG/DOSE,) 2 MG/1.5ML SOPN    Bempedoic Acid-Ezetimibe (NEXLIZET) 180-10 MG TABS    9. Bilateral carotid artery stenosis  I65.23 Bempedoic Acid-Ezetimibe (NEXLIZET) 180-10 MG TABS    PCV CAROTID DUPLEX (BILATERAL)    Lipid Panel With LDL/HDL Ratio    LDL cholesterol, direct    CMP14+EGFR    10. Hypertriglyceridemia  E78.1     11. Class 3 severe obesity due to excess calories with serious comorbidity and body mass index (BMI) of 45.0 to 49.9 in adult [facility])  E66.01    Z68.42        RECOMMENDATIONS: FINNIS SWALLEY is a 67 y.o. male whose past medical history and cardiac risk factors include: Moderate coronary artery calcification, chronic HFpEF, OSA not CPAP, Atrial Fibrillation, TIA, Hypertension, Hyperlipidemia, bilateral carotid artery stenosis, obesity due to excess calories.  Coronary atherosclerosis due to calcified coronary lesion Agatston CAC score 200-399 Total CAC 214, 66 percentile Will forego aspirin as he is on anticoagulation. Continue statin therapy and fenofibrate. Recommend a goal LDL <55 mg/dL. Add Nexlizet, medication profile discussed with patient and wife. Will also start Ozempic at 0.25 mg dose q. weekly x 4 weeks and then uptitrate if he tolerates it well.  For now patient remains reluctant.  However his wife is motivating that he should given his comorbidities and to help facilitate weight loss.  His wife is currently on it and could help  with medication administration as well.  Patient to consider. Results of the cardiac PET/CT reviewed.   No additional testing warranted at this time.   Educated on the  importance of secondary prevention.    Chronic heart failure with preserved ejection fraction (HFpEF) (HCC) Stage B, NYHA class II/III. Medications reconciled. Given his soft blood pressures will reduce Entresto to 49/51 mg p.o. twice daily Reemphasized importance of being evaluated for sleep apnea. Other medication changes and secondary prevention discussions as noted above  Persistent atrial fibrillation (HCC) Rate control: Metoprolol. Rhythm control: N/A. Thromboembolic prophylaxis: Xarelto. CHA2DS2-VASc SCORE is 5 which correlates to 7.2 % risk of stroke per year. Patient does not endorse any evidence of bleeding. Diagnosed in 2019, per patient. In the past has refused to be on antiarrhythmic medications, catheter directed procedures, or cardioversion. However, one of his family friends recently underwent cardioversion therefore, patient was considering it at last office visit.  Since the cardiac PET/CT is low risk for obstructive disease.  We discussed proceeding forward with direct-current cardioversion.  However he would like to hold off on this at the current time.   Long term (current) use of anticoagulants Indication: Persistent atrial fibrillation. Currently on Xarelto. Does not endorse evidence of bleeding. Hemoglobin relatively at baseline.  Hypercholesterolemia Hypertriglyceridemia Continue statin therapy and fenofibrate. Both LDL and lipids have improved on current medical therapy. Will add Nexlizet as discussed above. Refilled fenofibrate.  OSA (obstructive sleep apnea) Recently received referral from PCP to be evaluated for possible sleep apnea/repeat sleep study. Patient's wife states that he easily falls asleep. Denies feeling sleepy while driving Excessive snoring at night. Sleep study still pending.  I have encouraged him the importance of following through he endorses understanding  TIA (transient ischemic attack) Bilateral carotid artery stenosis Based  on carotid duplex LICA disease has improved & right ICA disease remained relatively stable Medication changes above to further possibly help facilitate plaque progression. Educated him on the importance of secondary prevention given his history of TIA. Due for carotid duplex in November 2024 Continue statin therapy, fenofibrate  Benign hypertension Office blood pressures are soft. I have asked him to hold Lasix if SBP is less than 100 mmHg.  Class 3 severe obesity due to excess calories with serious comorbidity and body mass index (BMI) of 45.0 to 49.9 in adult [facility]) Body mass index is 45.74 kg/m. I reviewed with him importance of diet, regular physical activity/exercise, weight loss.   Patient is educated on the importance of increasing physical activity gradually as tolerated with a goal of moderate intensity exercise for 30 minutes a day 5 days a week. However, not to overexert until ischemic workup is complete  Would like to see him back in 6 months; however, if he chooses to be on Ozempic like to see him back sooner (i.e. 4 weeks).  FINAL MEDICATION LIST END OF ENCOUNTER: Meds ordered this encounter  Medications   Semaglutide,0.25 or 0.5MG /DOS, (OZEMPIC, 0.25 OR 0.5 MG/DOSE,) 2 MG/1.5ML SOPN    Sig: Inject 0.25 mg into the skin once a week for 8 doses.    Dispense:  1.5 mL    Refill:  0   Bempedoic Acid-Ezetimibe (NEXLIZET) 180-10 MG TABS    Sig: Take 1 tablet by mouth daily at 12 noon.    Dispense:  90 tablet    Refill:  0   sacubitril-valsartan (ENTRESTO) 49-51 MG    Sig: Take 1 tablet by mouth 2 (two) times daily.    Dispense:  180  tablet    Refill:  0     Medications Discontinued During This Encounter  Medication Reason   chlorpheniramine-HYDROcodone (TUSSIONEX) 10-8 MG/5ML    doxycycline (MONODOX) 100 MG capsule    fenofibrate (TRICOR) 145 MG tablet    sacubitril-valsartan (ENTRESTO) 97-103 MG Dose change      Current Outpatient Medications:    albuterol  (VENTOLIN HFA) 108 (90 Base) MCG/ACT inhaler, Inhale 2 puffs into the lungs every 6 (six) hours as needed for wheezing or shortness of breath., Disp: 1 each, Rfl: 0   atorvastatin (LIPITOR) 40 MG tablet, TAKE 1 TABLET (40 MG TOTAL) BY MOUTH AT BEDTIME., Disp: 90 tablet, Rfl: 0   Bempedoic Acid-Ezetimibe (NEXLIZET) 180-10 MG TABS, Take 1 tablet by mouth daily at 12 noon., Disp: 90 tablet, Rfl: 0   fluticasone (FLONASE ALLERGY RELIEF) 50 MCG/ACT nasal spray, Place 1 spray into both nostrils in the morning and at bedtime., Disp: 15.8 mL, Rfl: 0   furosemide (LASIX) 20 MG tablet, Take 1 tablet (20 mg total) by mouth every morning., Disp: 90 tablet, Rfl: 0   gabapentin (NEURONTIN) 300 MG capsule, TAKE 1 CAPSULE (300 MG TOTAL) BY MOUTH 3 (THREE) TIMES DAILY., Disp: 21 capsule, Rfl: 0   metoprolol succinate (TOPROL-XL) 100 MG 24 hr tablet, TAKE 1 TABLET BY MOUTH ONCE DAILY AT BEDTIME FOR ATRIAL FIBRILLATION, Disp: 30 tablet, Rfl: 6   rivaroxaban (XARELTO) 20 MG TABS tablet, Take 1 tablet (20 mg total) by mouth daily with supper., Disp: 90 tablet, Rfl: 3   sacubitril-valsartan (ENTRESTO) 49-51 MG, Take 1 tablet by mouth 2 (two) times daily., Disp: 180 tablet, Rfl: 0   Semaglutide,0.25 or 0.5MG /DOS, (OZEMPIC, 0.25 OR 0.5 MG/DOSE,) 2 MG/1.5ML SOPN, Inject 0.25 mg into the skin once a week for 8 doses., Disp: 1.5 mL, Rfl: 0  Orders Placed This Encounter  Procedures   Lipid Panel With LDL/HDL Ratio   LDL cholesterol, direct   HYQ65+HQIO   EKG 12-Lead   PCV CAROTID DUPLEX (BILATERAL)     There are no Patient Instructions on file for this visit.   --Continue cardiac medications as reconciled in final medication list. --No follow-ups on file. Or sooner if needed. --Continue follow-up with your primary care physician regarding the management of your other chronic comorbid conditions.  Patient's questions and concerns were addressed to his satisfaction. He voices understanding of the instructions provided  during this encounter.   This note was created using a voice recognition software as a result there may be grammatical errors inadvertently enclosed that do not reflect the nature of this encounter. Every attempt is made to correct such errors.   Edward Weaver, [place], [facility]  Pager: [phone] Office: [phone]

## 2022-07-29 ENCOUNTER — Other Ambulatory Visit: Payer: Self-pay

## 2022-07-29 DIAGNOSIS — I6523 Occlusion and stenosis of bilateral carotid arteries: Secondary | ICD-10-CM

## 2022-07-29 DIAGNOSIS — I5032 Chronic diastolic (congestive) heart failure: Secondary | ICD-10-CM

## 2022-08-03 ENCOUNTER — Ambulatory Visit: Payer: Medicare Other | Admitting: Internal Medicine

## 2022-09-26 ENCOUNTER — Other Ambulatory Visit: Payer: Self-pay | Admitting: Cardiology

## 2022-09-26 DIAGNOSIS — E7800 Pure hypercholesterolemia, unspecified: Secondary | ICD-10-CM

## 2022-10-07 ENCOUNTER — Other Ambulatory Visit: Payer: Self-pay

## 2022-10-07 DIAGNOSIS — I4819 Other persistent atrial fibrillation: Secondary | ICD-10-CM

## 2022-10-07 DIAGNOSIS — Z7901 Long term (current) use of anticoagulants: Secondary | ICD-10-CM

## 2022-10-07 MED ORDER — RIVAROXABAN 20 MG PO TABS
20.0000 mg | ORAL_TABLET | Freq: Every day | ORAL | 3 refills | Status: DC
Start: 2022-10-07 — End: 2022-12-21

## 2022-11-12 ENCOUNTER — Ambulatory Visit: Payer: Medicare Other | Admitting: Cardiology

## 2022-11-13 ENCOUNTER — Ambulatory Visit: Payer: Medicare Other | Admitting: Cardiology

## 2022-11-23 ENCOUNTER — Ambulatory Visit: Payer: Medicare Other | Admitting: Cardiology

## 2022-11-27 ENCOUNTER — Other Ambulatory Visit: Payer: Self-pay

## 2022-11-27 ENCOUNTER — Encounter (HOSPITAL_BASED_OUTPATIENT_CLINIC_OR_DEPARTMENT_OTHER): Payer: Self-pay

## 2022-11-27 ENCOUNTER — Emergency Department (HOSPITAL_BASED_OUTPATIENT_CLINIC_OR_DEPARTMENT_OTHER)
Admission: EM | Admit: 2022-11-27 | Discharge: 2022-11-27 | Disposition: A | Discharge status: Home / Self Care | Payer: Medicare HMO | Attending: Emergency Medicine | Admitting: Emergency Medicine

## 2022-11-27 DIAGNOSIS — K047 Periapical abscess without sinus: Secondary | ICD-10-CM | POA: Diagnosis not present

## 2022-11-27 DIAGNOSIS — Z7901 Long term (current) use of anticoagulants: Secondary | ICD-10-CM | POA: Insufficient documentation

## 2022-11-27 DIAGNOSIS — Z79899 Other long term (current) drug therapy: Secondary | ICD-10-CM | POA: Diagnosis not present

## 2022-11-27 DIAGNOSIS — I5032 Chronic diastolic (congestive) heart failure: Secondary | ICD-10-CM | POA: Insufficient documentation

## 2022-11-27 DIAGNOSIS — Z8673 Personal history of transient ischemic attack (TIA), and cerebral infarction without residual deficits: Secondary | ICD-10-CM | POA: Insufficient documentation

## 2022-11-27 DIAGNOSIS — I110 Hypertensive heart disease with heart failure: Secondary | ICD-10-CM | POA: Diagnosis not present

## 2022-11-27 DIAGNOSIS — K0889 Other specified disorders of teeth and supporting structures: Secondary | ICD-10-CM | POA: Diagnosis present

## 2022-11-27 MED ORDER — CLINDAMYCIN HCL 150 MG PO CAPS
450.0000 mg | ORAL_CAPSULE | Freq: Once | ORAL | Status: AC
  Administered 2022-11-27: 450 mg via ORAL
  Filled 2022-11-27: qty 3

## 2022-11-27 MED ORDER — CLINDAMYCIN HCL 150 MG PO CAPS: 450.0000 mg | ORAL_CAPSULE | Freq: Three times a day (TID) | ORAL | 0 refills | Status: AC

## 2022-11-27 NOTE — Discharge Instructions (Addendum)
Thank you for coming to [facility] Emergency Department. You were seen for dental pain. We did an exam, land these showed likely a dental infection. You were prescribed clindamycin 450 mg every 8 hours x 7 days for a dental infection, but the definitive treatment is to see a dentist. Please follow up with your dentist on Monday.   Do not hesitate to return to the ED or call 911 if you experience: -Worsening symptoms -Lightheadedness, passing out -Fevers/chills -Anything else that concerns you

## 2022-11-27 NOTE — ED Provider Notes (Signed)
[place] [facility] AT [facility] Provider Note   CSN: [id number] Arrival date & time: 11/27/22  1638     History  Chief Complaint  Patient presents with   Dental Pain    Edward Weaver is a 67 y.o. male with PMH as listed below who presents with dental pain.  Pt reports breaking right upper tooth one week ago (tooth #6).  He states he has always had poor dentition and the tooth has had a cavity for a very long time.  However over the course of the week he began to have more pain and swelling in that area anything as he has an abscess there.  He does have an appointment with a dentist scheduled for Monday but he came here today looking for an antibiotic.  He has not had any fevers or chills.  He denies any throat swelling or neck swelling.  Otherwise has been in his normal state of health.   Past Medical History:  Diagnosis Date   Arthritis    Atrial fibrillation (HCC)    Bell's palsy    Carotid artery stenosis, asymptomatic, bilateral    Cerebrovascular disease    Chronic diastolic (congestive) heart failure (HCC)    Hiatal hernia    Hyperlipidemia    Hypertension    Neuropathy    OSA (obstructive sleep apnea)    Prediabetes    TIA (transient ischemic attack)    Nov and/or Dec 2014       Home Medications Prior to Admission medications   Medication Sig Start Date End Date Taking? Authorizing Provider  clindamycin (CLEOCIN) 150 MG capsule Take 3 capsules (450 mg total) by mouth 3 (three) times daily for 7 days. 11/28/22 12/05/22 Yes Loetta Rough, MD  albuterol (VENTOLIN HFA) 108 (90 Base) MCG/ACT inhaler Inhale 2 puffs into the lungs every 6 (six) hours as needed for wheezing or shortness of breath. 06/01/22   Crist Fat, MD  atorvastatin (LIPITOR) 40 MG tablet TAKE 1 TABLET (40 MG TOTAL) BY MOUTH AT BEDTIME. 09/28/22 12/27/22  Tolia, Sunit, DO  fenofibrate (TRICOR) 145 MG tablet Take 1 tablet (145 mg total) by mouth daily. 07/28/22 10/26/22   Tolia, Sunit, DO  fluticasone (FLONASE ALLERGY RELIEF) 50 MCG/ACT nasal spray Place 1 spray into both nostrils in the morning and at bedtime. 06/01/22   Crist Fat, MD  furosemide (LASIX) 20 MG tablet Take 1 tablet (20 mg total) by mouth every morning. 02/17/22 07/28/22  Tolia, Sunit, DO  gabapentin (NEURONTIN) 300 MG capsule TAKE 1 CAPSULE (300 MG TOTAL) BY MOUTH 3 (THREE) TIMES DAILY. 12/26/21   Pricilla Loveless, MD  metoprolol succinate (TOPROL-XL) 100 MG 24 hr tablet TAKE 1 TABLET BY MOUTH ONCE DAILY AT BEDTIME FOR ATRIAL FIBRILLATION 05/25/22   Tolia, Sunit, DO  rivaroxaban (XARELTO) 20 MG TABS tablet Take 1 tablet (20 mg total) by mouth daily with supper. 10/07/22   Tolia, Sunit, DO      Allergies    Penicillins    Review of Systems   Review of Systems A 10 point review of systems was performed and is negative unless otherwise reported in HPI.  Physical Exam Updated Vital Signs BP 107/76 (BP Location: Right Arm)   Pulse (!) 47   Temp 97.9 F (36.6 C) (Oral)   Resp 19   SpO2 99%  Physical Exam General: Normal appearing male, lying in bed.  HEENT: Sclera anicteric, MMM, trachea midline.  Poor dentition.  Several missing teeth.  Tooth #6 is eroded, blackened and parts.  At the gumline above tooth #6 there is a mild swelling and erythema with no visible fluctuance but induration is present.  No submandibular swelling, no oropharyngeal swelling. Cardiology: Irregularly irregular rhythm, no murmurs/rubs/gallops.  Resp: Normal respiratory rate and effort.  No stridor, no increased work of breathing. Abd: Soft, non-distended.  GU: Deferred. MSK: No peripheral edema or signs of trauma.  Skin: warm, dry. Neuro: A&Ox4, CNs II-XII grossly intact. MAEs. Sensation grossly intact.  Psych: Normal mood and affect.   ED Results / Procedures / Treatments   Labs (all labs ordered are listed, but only abnormal results are displayed) Labs Reviewed - No data to  display  EKG None  Radiology No results found.  Procedures Procedures    Medications Ordered in ED Medications  clindamycin (CLEOCIN) capsule 450 mg (has no administration in time range)    ED Course/ Medical Decision Making/ A&P                          Medical Decision Making Risk Prescription drug management.    MDM:    Patient with poor dentition at baseline and a broken tooth presents with increased pain likely consistent with a periapical or dental abscess.  He has had no fevers or chills and is otherwise vitally stable, no indication of SIRS or sepsis.  He has no submandibular swelling or pain to indicate Ludwig's angina, no airway compromise.  There is a very small amount of swelling at the base of tooth #6.  Will prescribe him clindamycin as he has a penicillin allergy.  Patient already has an appointment with a dentist on Monday.  If he gets worse while taking antibiotic between now and Monday he is instructed to return to the emergency department but he understands that definitive treatment of this is to see a dentist.  He reports understanding of discharge instructions and return precautions, all questions answered to their satisfaction.     Additional history obtained from wife at bedside, chart review  Social Determinants of Health:  lives independently  Disposition:  DC w/ discharge instructions/return precautions. All questions answered to patient's satisfaction.    Co morbidities that complicate the patient evaluation  Past Medical History:  Diagnosis Date   Arthritis    Atrial fibrillation (HCC)    Bell's palsy    Carotid artery stenosis, asymptomatic, bilateral    Cerebrovascular disease    Chronic diastolic (congestive) heart failure (HCC)    Hiatal hernia    Hyperlipidemia    Hypertension    Neuropathy    OSA (obstructive sleep apnea)    Prediabetes    TIA (transient ischemic attack)    Nov and/or Dec 2014     Medicines Meds ordered  this encounter  Medications   clindamycin (CLEOCIN) 150 MG capsule    Sig: Take 3 capsules (450 mg total) by mouth 3 (three) times daily for 7 days.    Dispense:  63 capsule    Refill:  0   clindamycin (CLEOCIN) capsule 450 mg    I have reviewed the patients home medicines and have made adjustments as needed  Problem List / ED Course: Problem List Items Addressed This Visit   None Visit Diagnoses     Dental abscess    -  Primary                   This note was created using dictation  software, which may contain spelling or grammatical errors.    Loetta Rough, MD 11/27/22 2029

## 2022-11-27 NOTE — ED Triage Notes (Signed)
Pt reports breaking right upper tooth one week ago. Now thinks he has abscess. Appt with dentist next week

## 2022-12-16 ENCOUNTER — Telehealth: Payer: Self-pay | Admitting: Cardiology

## 2022-12-16 NOTE — Telephone Encounter (Signed)
Pt called in stating Leane Para told them his patient assistance needs to be renewed for Xarelto. Please advise.

## 2022-12-16 NOTE — Telephone Encounter (Signed)
I spoke with patient. He reports chest pain on the left side of his chest which occurs mostly at night while he is sitting on the couch.  Started about 2 weeks ago.  Lasts about 5 minutes and goes away on it's own. He can rub his chest and sometimes that will help the pain go away.  No radiation of pain. Will sometimes occur during the day but does not seem to be related to activity.  Patient reports he tries to stay active but recently has been very fatigued. Has shortness of breath but does not think it has worsened.  Lack of energy is new. He was seen in ED recently for dental abscess.  Patient reports he has to have tooth removed and this is scheduled for next week  He has paper from dental office that he is going to bring in requesting stopping blood thinner prior to procedure.  I advised him to bring this in as soon as possible or have dental office fax to our office.  Patient reports heart rate was low when in the ED recently.  He does not check heart rate at home. ED precautions reviewed with pt. Will send to Dr Odis Hollingshead for review/recommendations.

## 2022-12-16 NOTE — Telephone Encounter (Signed)
   Pt c/o of Chest Pain: STAT if active CP, including tightness, pressure, jaw pain, radiating pain to shoulder/upper arm/back, CP unrelieved by Nitro. Symptoms reported of SOB, nausea, vomiting, sweating.  1. Are you having CP right now? No, He states it is usually at night    2. Are you experiencing any other symptoms (ex. SOB, nausea, vomiting, sweating)? SOB and fatigue    3. Is your CP continuous or coming and going? Coming and going    4. Have you taken Nitroglycerin? No    5. How long have you been experiencing CP? Two weeks     6. If NO CP at time of call then end call with telling Pt to call back or call 911 if Chest pain returns prior to return call from triage team.

## 2022-12-16 NOTE — Telephone Encounter (Signed)
Patient reports he is currently in assistance program but it will run out soon and he needs new prescription sent in

## 2022-12-17 ENCOUNTER — Telehealth: Payer: Self-pay | Admitting: Cardiology

## 2022-12-17 ENCOUNTER — Telehealth: Payer: Self-pay | Admitting: *Deleted

## 2022-12-17 NOTE — Telephone Encounter (Signed)
Need update prescription form. Fax # [phone]. Please advise

## 2022-12-17 NOTE — Telephone Encounter (Signed)
   Pre-operative Risk Assessment    Patient Name: Edward Weaver  DOB: [birth year] MRN: [id number]  DATE OF LAST VISIT: 07/28/22 DR. TOLIA DATE OF NEXT VISIT: NONE  DR. PHILIPS, DDS IS ASKING WITH THE CLEARANCE; THE PT'S EJECTION FRACTION AS WELL.   SEE NOTES FROM TRIAGE 12/16/22, PT C/O CHEST PAIN AND RECENTLY IN THE ED FOR LOW HR.    Request for Surgical Clearance    Procedure:  Dental Extraction - Amount of Teeth to be Pulled:  2 TEETH-SURGICAL  Date of Surgery:  Clearance 12/21/22                                 Surgeon:  DR. Stevenson Clinch, DDS Surgeon's Group or Practice Name:  [facility] Phone number:  [phone] Fax number:  [phone]   Type of Clearance Requested:   - Medical  - Pharmacy:  Hold Rivaroxaban (Xarelto)     Type of Anesthesia:  Local    Additional requests/questions:    [name]   12/17/2022, 9:50 AM

## 2022-12-17 NOTE — Telephone Encounter (Signed)
PAP is approved. Please send pt's preferred pharmacy a new rx.

## 2022-12-17 NOTE — Telephone Encounter (Signed)
   Patient Name: Edward Weaver  DOB: 26-Aug-1955 MRN: [id number]  Primary Cardiologist: Dr.Tulio   Chart reviewed as part of pre-operative protocol coverage.   IF SIMPLE EXTRACTION/CLEANINGS: Simple dental extractions (i.e. 1-2 teeth) are considered low risk procedures per guidelines and generally do not require any specific cardiac clearance. It is also generally accepted that for simple extractions and dental cleanings, there is no need to interrupt blood thinner therapy.  I have reviewed the most recent echocardiogram from 07/27/2021 with normal ejection fraction. Stress PET scan revealed no evidence of ischemia or prior infarction. Low risk study. EF of 47%. Plan for repeat echo on follow up appointment with Dr. Odis Hollingshead. Would not delay dental extraction in the setting of abscess and pain.  He was placed on antibiotics per ED visit 11/27/2022 with clindamycin 150 mg TID for 7 days. .    SBE prophylaxis is not required for the patient from a cardiac standpoint.  I will route this recommendation to the requesting party via Epic fax function and remove from pre-op pool.  Please call with questions.  Joni Reining, NP 12/17/2022, 11:17 AM

## 2022-12-18 NOTE — Telephone Encounter (Signed)
Chest pain appears to be noncardiac.  He recently underwent cardiac PET/CT which noted normal perfusion.  Continue monitoring and if the symptoms that he is currently experiences increases in intensity frequency or duration would recommend either office visit or ED visit based on acuity.  With regards to the upcoming dental extractions-recommend holding Xarelto 2 days prior to the procedure at least.  Restart once cleared by his dentist.  Tessa Lerner, DO, [facility]

## 2022-12-21 ENCOUNTER — Other Ambulatory Visit: Payer: Self-pay

## 2022-12-21 ENCOUNTER — Other Ambulatory Visit (HOSPITAL_COMMUNITY): Payer: Self-pay

## 2022-12-21 DIAGNOSIS — I4819 Other persistent atrial fibrillation: Secondary | ICD-10-CM

## 2022-12-21 DIAGNOSIS — Z7901 Long term (current) use of anticoagulants: Secondary | ICD-10-CM

## 2022-12-21 MED ORDER — RIVAROXABAN 20 MG PO TABS
20.0000 mg | ORAL_TABLET | Freq: Every day | ORAL | 3 refills | Status: DC
Start: 2022-12-21 — End: 2023-01-04
  Filled 2022-12-21: qty 90, 90d supply, fill #0

## 2022-12-21 NOTE — Telephone Encounter (Signed)
Prescription refill request for Xarelto received.  Indication:afib Last office visit:5/24 Weight:124.7  kg Age:67 Scr:1.19  2/24 CrCl:106.25  ml/min  Prescription refilled

## 2022-12-21 NOTE — Telephone Encounter (Signed)
New Rx was sent it today by Sigurd Sos RN

## 2022-12-21 NOTE — Telephone Encounter (Signed)
Spoke with patient to give provider recommendations and he states he has been experiencing low heart rate. Unable to give any readings. He states it is time for him to have follow up with provider. Appointment scheduled.

## 2022-12-25 ENCOUNTER — Other Ambulatory Visit: Payer: Self-pay | Admitting: Cardiology

## 2022-12-25 ENCOUNTER — Telehealth: Payer: Self-pay | Admitting: Cardiology

## 2022-12-25 ENCOUNTER — Other Ambulatory Visit: Payer: Self-pay | Admitting: *Deleted

## 2022-12-25 ENCOUNTER — Other Ambulatory Visit: Payer: Self-pay | Admitting: Internal Medicine

## 2022-12-25 DIAGNOSIS — F419 Anxiety disorder, unspecified: Secondary | ICD-10-CM

## 2022-12-25 DIAGNOSIS — I4819 Other persistent atrial fibrillation: Secondary | ICD-10-CM

## 2022-12-25 DIAGNOSIS — G629 Polyneuropathy, unspecified: Secondary | ICD-10-CM

## 2022-12-25 NOTE — Telephone Encounter (Signed)
*  STAT* If patient is at the pharmacy, call can be transferred to refill team.   1. Which medications need to be refilled? (please list name of each medication and dose if known)   gabapentin (NEURONTIN) 300 MG capsule  metoprolol succinate (TOPROL-XL) 100 MG 24 hr tablet   2. Would you like to learn more about the convenience, safety, & potential cost savings by using the [facility]?   3. Are you open to using the [facility] (Type [facility]. ).  4. Which pharmacy/location (including street and city if local pharmacy) is medication to be sent to?  [facility] - [place], [place] - [address]   5. Do they need a 30 day or 90 day supply?   90 day  Patient stated he is completely out of this medication.  Patient has appointment scheduled on 10/15.

## 2022-12-28 ENCOUNTER — Other Ambulatory Visit: Payer: Self-pay | Admitting: Internal Medicine

## 2022-12-28 DIAGNOSIS — F419 Anxiety disorder, unspecified: Secondary | ICD-10-CM

## 2022-12-28 DIAGNOSIS — G629 Polyneuropathy, unspecified: Secondary | ICD-10-CM

## 2022-12-29 ENCOUNTER — Ambulatory Visit: Payer: Medicare HMO | Admitting: Cardiology

## 2022-12-31 ENCOUNTER — Telehealth: Payer: Self-pay | Admitting: Cardiology

## 2022-12-31 NOTE — Telephone Encounter (Signed)
Patient dropped off a patient assistance paper - this is a previous [facility] patient.  The paper is in your folder at the front desk.  Thank you.

## 2022-12-31 NOTE — Telephone Encounter (Signed)
Patient dropped off a "Verification of Chronic Condition" forms from [facility] to Dr. Odis Hollingshead to sign off on and fax back to [facility].    I am placing these forms in Dr. Emelda Brothers mail box.  The patient says these documents are time sensitive, and his insurance will cutt off if it is not turned back into [employer] by 01/14/2023.  Thank you.

## 2022-12-31 NOTE — Telephone Encounter (Signed)
Pt's paperwork was scanned to [place] [place], [facility] email. FYI

## 2022-12-31 NOTE — Telephone Encounter (Signed)
Dr. Odis Hollingshead will be back in the office on next Monday 10/21.  Will route this message to Dr. Odis Hollingshead and his covering team that day, so that they can get the forms signed and completed, then follow-up with the pt accordingly thereafter.

## 2023-01-01 ENCOUNTER — Telehealth: Payer: Self-pay

## 2023-01-01 ENCOUNTER — Other Ambulatory Visit (HOSPITAL_COMMUNITY): Payer: Self-pay

## 2023-01-01 DIAGNOSIS — I4819 Other persistent atrial fibrillation: Secondary | ICD-10-CM

## 2023-01-01 DIAGNOSIS — Z7901 Long term (current) use of anticoagulants: Secondary | ICD-10-CM

## 2023-01-01 NOTE — Telephone Encounter (Signed)
Document patient got from [employer] stated they still needed the complete prescriber form. LV 01/01/23 to let pt know form was completed and faxed to [employer].

## 2023-01-01 NOTE — Telephone Encounter (Signed)
Received documents for Xarelto app from [employer]. Reviewing documents

## 2023-01-02 ENCOUNTER — Other Ambulatory Visit (HOSPITAL_COMMUNITY): Payer: Self-pay

## 2023-01-04 ENCOUNTER — Other Ambulatory Visit (HOSPITAL_COMMUNITY): Payer: Self-pay

## 2023-01-04 MED ORDER — RIVAROXABAN 20 MG PO TABS: 20.0000 mg | ORAL_TABLET | Freq: Every day | ORAL | Status: DC

## 2023-01-04 NOTE — Telephone Encounter (Signed)
I have signed it from cardiology perspective and gave it to [employer] and [employer]..  Tessa Lerner, DO, [facility]

## 2023-01-04 NOTE — Telephone Encounter (Signed)
Pt came to the office stating that he was told to pick up samples of Xarelto 20 mg tablets. This was not documented in pt's chart. I am giving pt 2 weeks of Xarelto 20 mg tablets samples at [facility] for pt to pick up. FYI

## 2023-01-05 NOTE — Telephone Encounter (Signed)
Completed form returned to designated tray at front desk on 01/04/2023.

## 2023-01-11 ENCOUNTER — Ambulatory Visit (HOSPITAL_COMMUNITY): Admission: RE | Admit: 2023-01-11 | Payer: Medicare HMO | Source: Ambulatory Visit

## 2023-01-11 ENCOUNTER — Other Ambulatory Visit: Payer: Medicare Other

## 2023-01-12 ENCOUNTER — Ambulatory Visit: Payer: Medicare HMO | Attending: Cardiology | Admitting: Cardiology

## 2023-01-12 ENCOUNTER — Telehealth: Payer: Self-pay | Admitting: Cardiology

## 2023-01-12 VITALS — BP 120/88 | HR 56 | Resp 16 | Ht 66.0 in | Wt 287.2 lb

## 2023-01-12 DIAGNOSIS — E66813 Obesity, class 3: Secondary | ICD-10-CM

## 2023-01-12 DIAGNOSIS — Z6841 Body Mass Index (BMI) 40.0 and over, adult: Secondary | ICD-10-CM

## 2023-01-12 DIAGNOSIS — I5032 Chronic diastolic (congestive) heart failure: Secondary | ICD-10-CM | POA: Diagnosis not present

## 2023-01-12 DIAGNOSIS — G459 Transient cerebral ischemic attack, unspecified: Secondary | ICD-10-CM

## 2023-01-12 DIAGNOSIS — Z7901 Long term (current) use of anticoagulants: Secondary | ICD-10-CM | POA: Diagnosis not present

## 2023-01-12 DIAGNOSIS — I2510 Atherosclerotic heart disease of native coronary artery without angina pectoris: Secondary | ICD-10-CM

## 2023-01-12 DIAGNOSIS — I6523 Occlusion and stenosis of bilateral carotid arteries: Secondary | ICD-10-CM | POA: Diagnosis not present

## 2023-01-12 DIAGNOSIS — I2584 Coronary atherosclerosis due to calcified coronary lesion: Secondary | ICD-10-CM

## 2023-01-12 DIAGNOSIS — I4819 Other persistent atrial fibrillation: Secondary | ICD-10-CM

## 2023-01-12 DIAGNOSIS — E781 Pure hyperglyceridemia: Secondary | ICD-10-CM

## 2023-01-12 DIAGNOSIS — E7800 Pure hypercholesterolemia, unspecified: Secondary | ICD-10-CM | POA: Diagnosis not present

## 2023-01-12 DIAGNOSIS — R931 Abnormal findings on diagnostic imaging of heart and coronary circulation: Secondary | ICD-10-CM | POA: Diagnosis not present

## 2023-01-12 MED ORDER — BUMETANIDE 0.5 MG PO TABS
0.5000 mg | ORAL_TABLET | Freq: Every day | ORAL | 2 refills | Status: DC
Start: 2023-01-12 — End: 2023-06-07

## 2023-01-12 MED ORDER — RIVAROXABAN 20 MG PO TABS
20.0000 mg | ORAL_TABLET | Freq: Every day | ORAL | 0 refills | Status: DC
Start: 2023-01-12 — End: 2023-03-29

## 2023-01-12 MED ORDER — FENOFIBRATE 145 MG PO TABS: 145.0000 mg | ORAL_TABLET | Freq: Every day | ORAL | 3 refills | Status: DC

## 2023-01-12 MED ORDER — SACUBITRIL-VALSARTAN 24-26 MG PO TABS: 1.0000 | ORAL_TABLET | Freq: Two times a day (BID) | ORAL | 11 refills | Status: DC

## 2023-01-12 NOTE — Progress Notes (Signed)
Cardiology Office Note:  .   Date:  01/12/2023  ID:  Edward Weaver, DOB [birth year], MRN [id number] PCP:  Crist Fat, MD  Former Cardiology Providers: None [facility] Providers Cardiologist:  Tessa Lerner, DO , [facility] (established care 08/13/2020) Electrophysiologist:  None  Click to update primary MD,subspecialty MD or APP then REFRESH:1}    Chief Complaint  Patient presents with   Coronary atherosclerosis due to calcified coronary lesion   Persistent atrial fibrillation   Hypertension   Follow-up    History of Present Illness: .   Edward Weaver is a 67 y.o. Caucasian male whose past medical history and cardiovascular risk factors includes: Moderate coronary artery calcification (214, 66th percentile), chronic HFpEF, hx of COVID, OSA not CPAP, Atrial Fibrillation, TIA, Hypertension, Hyperlipidemia, bilateral carotid artery stenosis, obesity due to excess calories.   Patient is accompanied by his wife [name] at today's office visit.  Patient is being followed by the practice for his underlying persistent atrial fibrillation, chronic HFpEF, and carotid disease.   At the last office visit patient was recommended to start Ozempic to help facilitate weight loss given his comorbidities.  Also next visit was added to help improve his LDL given his CAC/HFpEF/history of TIA.  Both of these medications have not been initiated for reasons unknown.  He has been educated on multiple office visits on importance of medication compliance.  He does not know what pills he is currently taking with regards to the doses of fenofibrate, Entresto, and furosemide.  In addition he has been encouraged to follow-up with sleep medicine to evaluate for sleep apnea but is resistant due to concerns of him not able to wear the CPAP machine.  Clinically he denies anginal chest pain or heart failure symptoms.  No hospitalizations for cardiovascular reasons since last office encounter.   Review of  Systems: .   Review of Systems  Constitutional: Negative for chills and fever.  HENT:  Negative for hoarse voice and nosebleeds.   Eyes:  Negative for discharge, double vision and pain.  Cardiovascular:  Positive for dyspnea on exertion (chronic and stable.). Negative for chest pain, claudication, leg swelling, near-syncope, orthopnea, palpitations, paroxysmal nocturnal dyspnea and syncope.  Respiratory:  Positive for shortness of breath and snoring. Negative for hemoptysis.   Musculoskeletal:  Negative for muscle cramps and myalgias.  Gastrointestinal:  Negative for abdominal pain, constipation, diarrhea, hematemesis, hematochezia, melena, nausea and vomiting.  Neurological:  Negative for dizziness and light-headedness.    Studies Reviewed:   EKG: EKG Interpretation Date/Time:  Tuesday January 12 2023 08:27:22 EDT Ventricular Rate:  56 PR Interval:    QRS Duration:  98 QT Interval:  432 QTC Calculation: 416 R Axis:   48  Text Interpretation: Atrial fibrillation with slow ventricular response When compared with ECG of 26-Dec-2021 18:29, No significant change since last tracing Confirmed by Tessa Lerner [phone]) on 01/12/2023 8:35:20 AM  Echocardiogram: 01/11/2017: LVEF 50-55%   08/22/2020: LVEF 45-50%, moderate LVH, elevated LAP, moderately dilated left atrium, mild MR, mild TR.   07/23/2021:  Left ventricle cavity is normal in size. Moderate concentric hypertrophy of the left ventricle. Normal LV systolic function with visual EF 50-55%.  Normal global wall motion.  Unable to evaluate diastolic function due to atrial fibrillation.  Left atrial cavity is mildly dilated.  Mild to moderate mitral regurgitation.  Mild to moderate tricuspid regurgitation.  No evidence of pulmonary hypertension.  No significant change compared to previous study on 08/22/2020.   Stress  Testing: Cardiac PET/CT  07/15/2022   LV perfusion is normal. There is no evidence of ischemia. There is no evidence  of infarction.   Rest left ventricular function is abnormal. Rest EF: 47 %. Stress EF: 42 %. End diastolic cavity size is mildly enlarged. End systolic cavity size is mildly enlarged.   Myocardial blood flow was computed to be 0.19ml/g/min at rest and 1.20ml/g/min at stress. Global myocardial blood flow reserve was 2.12 and was normal.   Coronary calcium was present on the attenuation correction CT images. Moderate coronary calcifications were present. Coronary calcifications were present in the left anterior descending artery, left circumflex artery and right coronary artery distribution(s).   Findings are consistent with no ischemia and no infarction. The study is low risk.   Prior echo suggested resting EF 50-55% suggest updating given abnormal rest/stress EF on this study  Carotid Duplex: 07/11/2021:  Right ICA disease <70%. Left ICA disease <50% See report for additional details  07/21/2022: Right ICA disease <70%  Left ICA disease <15%  Bilateral antegrade vertebral flow  57-month follow-up study recommended  RADIOLOGY: N/A (  Risk Assessment/Calculations:   Click Here to Calculate/Change CHADS2VASc Score The patient's CHADS2-VASc score is 6, indicating a 9.7% annual risk of stroke.   CHF History: Yes HTN History: Yes Diabetes History: No Stroke History: Yes Vascular Disease History: Yes    Labs:       Latest Ref Rng & Units 01/12/2023    9:33 AM 05/04/2022    2:47 PM 04/09/2022   12:51 PM  CBC  Hemoglobin 13.0 - 17.7 g/dL 32.4  40.1  02.7   Hematocrit 37.5 - 51.0 % 43.2  44.7  43.2        Latest Ref Rng & Units 01/12/2023    9:33 AM 05/04/2022    2:47 PM 04/09/2022   12:51 PM  BMP  Glucose 70 - 99 mg/dL 253  664  403   BUN 8 - 27 mg/dL 16  18  18    Creatinine 0.76 - 1.27 mg/dL 4.74  2.59  5.63   BUN/Creat Ratio 10 - 24 14  SEE NOTE:  SEE NOTE:   Sodium 134 - 144 mmol/L 141  140  141   Potassium 3.5 - 5.2 mmol/L 4.6  5.1  4.6   Chloride 96 - 106 mmol/L 104  103   105   CO2 20 - 29 mmol/L 27  30  26    Calcium 8.6 - 10.2 mg/dL 9.3  9.2  9.2       Latest Ref Rng & Units 01/12/2023    9:33 AM 05/04/2022    2:47 PM 04/09/2022   12:51 PM  CMP  Glucose 70 - 99 mg/dL 875  643  329   BUN 8 - 27 mg/dL 16  18  18    Creatinine 0.76 - 1.27 mg/dL 5.18  8.41  6.60   Sodium 134 - 144 mmol/L 141  140  141   Potassium 3.5 - 5.2 mmol/L 4.6  5.1  4.6   Chloride 96 - 106 mmol/L 104  103  105   CO2 20 - 29 mmol/L 27  30  26    Calcium 8.6 - 10.2 mg/dL 9.3  9.2  9.2   Total Protein 6.1 - 8.1 g/dL   6.7   Total Bilirubin 0.2 - 1.2 mg/dL   1.0   AST 10 - 35 U/L   11   ALT 9 - 46 U/L   22  Lab Results  Component Value Date   CHOL 137 05/04/2022   HDL 42 05/04/2022   LDLCALC 73 05/04/2022   LDLDIRECT 86 05/04/2022   TRIG 138 05/04/2022   CHOLHDL 3.3 05/04/2022   No results for input(s): "LIPOA" in the last 8760 hours. No components found for: "NTPROBNP" Recent Labs    01/12/23 0933  PROBNP 1,049*   Recent Labs    05/06/22 1604  TSH 1.740    External Labs: Collected: 08/06/2020 provided by PCP Creatinine 1 mg/dL. eGFR: 79 mL/min per 1.73 m Lipid profile: Total cholesterol 218, triglycerides 269, HDL 45, LDL 132, non HDL 173 Hemoglobin A1c: 6 TSH: 2.06  Physical Exam:    Today's Vitals   01/12/23 0830  BP: 120/88  Pulse: (!) 56  Resp: 16  SpO2: 94%  Weight: 287 lb 3.2 oz (130.3 kg)  Height: 5\' 6"  (1.676 m)   Body mass index is 46.36 kg/m. Wt Readings from Last 3 Encounters:  01/12/23 287 lb 3.2 oz (130.3 kg)  11/27/22 275 lb (124.7 kg)  07/28/22 283 lb 6.4 oz (128.5 kg)    Physical Exam  Constitutional: No distress.  hemodynamically stable, obese  Neck:  Unable to evaluate JVP due to short neck stature and adipose tissue  Cardiovascular: Normal rate, S1 normal and S2 normal. An irregularly irregular rhythm present. Exam reveals no gallop, no S3 and no S4.  Murmur heard. Holosystolic murmur is present with a grade of 3/6 at  the apex. Pulmonary/Chest: Effort normal and breath sounds normal. No stridor. He has no wheezes. He has no rales.  Abdominal: Soft. Bowel sounds are normal. He exhibits no distension. There is no abdominal tenderness.  Abdominal obesity  Musculoskeletal:        General: Edema (+2 bilaterally) present.     Cervical back: Neck supple.  Neurological: He is alert and oriented to person, place, and time. He has intact cranial nerves (2-12).  Skin: Skin is warm.    Impression & Recommendation(s):  Impression:   ICD-10-CM   1. Persistent atrial fibrillation (HCC)  I48.19 EKG 12-Lead    Hemoglobin and hematocrit, blood    rivaroxaban (XARELTO) 20 MG TABS tablet    2. Long term (current) use of anticoagulants  Z79.01     3. Coronary atherosclerosis due to calcified coronary lesion  I25.10    I25.84     4. Agatston CAC score 200-399  R93.1     5. Chronic heart failure with preserved ejection fraction (HFpEF) (HCC)  I50.32 Basic metabolic panel    Pro b natriuretic peptide (BNP)    Basic metabolic panel    Pro b natriuretic peptide (BNP)    bumetanide (BUMEX) 0.5 MG tablet    Pro b natriuretic peptide (BNP)    Basic metabolic panel    6. Bilateral carotid artery stenosis  I65.23     7. TIA (transient ischemic attack)  G45.9     8. Hypercholesterolemia  E78.00     9. Hypertriglyceridemia  E78.1     10. Class 3 severe obesity due to excess calories with serious comorbidity and body mass index (BMI) of 45.0 to 49.9 in adult [facility])  N56.213 AMB Referral to [facility] Pharm-D   Z68.42    E66.01        Recommendation(s):  Persistent atrial fibrillation (HCC) Long term (current) use of anticoagulants Heart rate control: Metoprolol. Rhythm control: N/A. Thromboembolic prophylaxis: Xarelto. Check H&H and BMP -to follow-up on hemoglobin levels and renal function as he  is on anticoagulation. Does not endorse evidence of bleeding. Diagnosed with A-fib back in 2019, per patient. We  have discussed the role of rhythm control strategy -in the past he has refused to undergo catheter directed procedures, cardioversion, antiarrhythmic medications.  However, one of his meds was cardioverted in the recent past and he has likely considering it as an option. His cardiac PET/CT was low risk and we did discuss antiarrhythmic options as well. After discussing the risks, benefits, alternatives patient would like to hold off on antiarrhythmics at this time.  Coronary atherosclerosis due to calcified coronary lesion Agatston CAC score 200-399 Total CAC 214, 66 percentile Not on aspirin as he is currently on anticoagulation. Continue statin therapy and fenofibrate. Recommend a goal LDL <55 mg/dL. Have tried to add a GLP-1 agonist to his medical regimen but due to insurance reasons have had difficulty.  Will refer to Pharm.D. to help coordinate care and uptitrate medical therapy.  Chronic heart failure with preserved ejection fraction (HFpEF) (HCC) Stage B, NYHA class II/III. Denies orthopnea or PND.  On physical examination he does have bilateral lower extremity swelling. During the office visit he did not know his medication list but prior to the completion of the note he did call us back stating that he is on Entresto 49/51 mg 0.5 tablets twice daily, not taking Lasix, and the dose of fenofibrate. Will change Entresto to 24/26 mg p.o. twice daily. Start Bumex 0.5 mg p.o. daily. Check baseline labs and BMP in one week to check renal function and electrolytes   Bilateral carotid artery stenosis TIA (transient ischemic attack) Tentatively scheduled for carotid duplex in November 2024. Continue statin therapy, fenofibrate. Reemphasized the importance of secondary prevention with focus on improving her modifiable cardiovascular risk factors such as glycemic control, lipid management, blood pressure control, weight loss.  Hypercholesterolemia Hypertriglyceridemia Currently on Lipitor 40  mg p.o. nightly. Fenofibrate 145 mg p.o. daily.   He denies myalgia or other side effects. Most recent lipids dated February 2024 from [facility] database, independently reviewed.  LDL 73 mg/dL and triglycerides 540 mg/dL  Class 3 severe obesity due to excess calories with serious comorbidity and body mass index (BMI) of 45.0 to 49.9 in adult (HCC) Body mass index is 46.36 kg/m. I reviewed with him importance of diet, regular physical activity/exercise, weight loss.   Patient is educated on the importance of increasing physical activity gradually as tolerated with a goal of moderate intensity exercise for 30 minutes a day 5 days a week.  In the meantime I have asked him to discuss with PCP seeing someone for sleep medicine to evaluate for sleep apnea.  I have asked both the patient and wife to consider either cardioversion or antiarrhythmic plus cardioversion in an attempt to restore sinus rhythm.  Orders Placed:  Orders Placed This Encounter  Procedures   Basic metabolic panel    Order Specific Question:   Has the patient fasted?    Answer:   No   Hemoglobin and hematocrit, blood   Pro b natriuretic peptide (BNP)   Basic metabolic panel    Standing Status:   Future    Number of Occurrences:   1    Standing Expiration Date:   01/12/2024    Order Specific Question:   Has the patient fasted?    Answer:   No   Pro b natriuretic peptide (BNP)    Standing Status:   Future    Number of Occurrences:   1  Standing Expiration Date:   01/12/2024   AMB Referral to [facility] Pharm-D    Referral Priority:   Routine    Referral Type:   Consultation    Referral Reason:   Specialty Services Required    Number of Visits Requested:   1   EKG 12-Lead    Final Medication List:    Meds ordered this encounter  Medications   bumetanide (BUMEX) 0.5 MG tablet    Sig: Take 1 tablet (0.5 mg total) by mouth daily.    Dispense:  30 tablet    Refill:  2   rivaroxaban (XARELTO) 20 MG TABS tablet    Sig:  Take 1 tablet (20 mg total) by mouth daily with supper.    Dispense:  14 tablet    Refill:  0    Order Specific Question:   Lot Number?    Answer:   [id number]    There are no discontinued medications.   Current Outpatient Medications:    albuterol (VENTOLIN HFA) 108 (90 Base) MCG/ACT inhaler, Inhale 2 puffs into the lungs every 6 (six) hours as needed for wheezing or shortness of breath., Disp: 1 each, Rfl: 0   atorvastatin (LIPITOR) 40 MG tablet, TAKE 1 TABLET (40 MG TOTAL) BY MOUTH AT BEDTIME., Disp: 90 tablet, Rfl: 0   bumetanide (BUMEX) 0.5 MG tablet, Take 1 tablet (0.5 mg total) by mouth daily., Disp: 30 tablet, Rfl: 2   gabapentin (NEURONTIN) 300 MG capsule, TAKE 1 CAPSULE (300 MG TOTAL) BY MOUTH 3 (THREE) TIMES DAILY., Disp: 21 capsule, Rfl: 0   metoprolol succinate (TOPROL-XL) 100 MG 24 hr tablet, TAKE 1 TABLET BY MOUTH ONCE DAILY AT BEDTIME FOR ATRIAL FIBRILLATION, Disp: 30 tablet, Rfl: 6   rivaroxaban (XARELTO) 20 MG TABS tablet, Take 1 tablet (20 mg total) by mouth daily with supper., Disp: 14 tablet, Rfl:    rivaroxaban (XARELTO) 20 MG TABS tablet, Take 1 tablet (20 mg total) by mouth daily with supper., Disp: 14 tablet, Rfl: 0   fenofibrate (TRICOR) 145 MG tablet, Take 1 tablet (145 mg total) by mouth daily., Disp: 90 tablet, Rfl: 3   sacubitril-valsartan (ENTRESTO) 24-26 MG, Take 1 tablet by mouth 2 (two) times daily., Disp: 60 tablet, Rfl: 11  Consent:   N/A  Disposition:   3 months-A-fib management/carotid disease/persistent A-fib Patient may be asked to follow-up sooner based on the results of the above-mentioned testing.  His questions and concerns were addressed to his satisfaction. He voices understanding of the recommendations provided during this encounter.    Signed, Tessa Lerner, DO, [facility]  [facility]  [address] [place], [place] [address] 01/16/2023 11:32 AM

## 2023-01-12 NOTE — Telephone Encounter (Signed)
Per Dr. Odis Hollingshead: refill Sherryll Burger 24-26 mg BID and fenofibrate 145 mg daily.   Refills sent to [facility].  Patient is not to take Lasix.  Left message for patient to callback to discuss the above.

## 2023-01-12 NOTE — Telephone Encounter (Signed)
Spoke with patient and reviewed medications.  Patient states he is not currently taking fenofibrate, furosemide or fluticasone. Medications removed from medication list.  Medication list updated to add Entresto 49-51 mg--patient reports taking 0.5 tablets BID.  Will forward to Dr. Odis Hollingshead for review.

## 2023-01-12 NOTE — Patient Instructions (Signed)
Medication Instructions:  Your physician has recommended you make the following change in your medication:   START Bumex 0.5 mg once daily    *If you need a refill on your cardiac medications before your next appointment, please call your pharmacy*  Lab Work: TODAY: BMP, BNP, Hemoglobin/ Hematocrit  To be completed in 1 WEEK: repeat BMP and BNP  If you have labs (blood work) drawn today and your tests are completely normal, you will receive your results only by: MyChart Message (if you have MyChart) OR A paper copy in the mail If you have any lab test that is abnormal or we need to change your treatment, we will call you to review the results.  Testing/Procedures: None ordered today.  Follow-Up: At [facility], you and your health needs are our priority.  As part of our continuing mission to provide you with exceptional heart care, we have created designated Provider Care Teams.  These Care Teams include your primary Cardiologist (physician) and Advanced Practice Providers (APPs -  Physician Assistants and Nurse Practitioners) who all work together to provide you with the care you need, when you need it.  We recommend signing up for the patient portal called "MyChart".  Sign up information is provided on this After Visit Summary.  MyChart is used to connect with patients for Virtual Visits (Telemedicine).  Patients are able to view lab/test results, encounter notes, upcoming appointments, etc.  Non-urgent messages can be sent to your provider as well.   To learn more about what you can do with MyChart, go to [email or web address].    Your next appointment:   3 month(s)  The format for your next appointment:   In Person  Provider:   Jari Favre, PA-C, Ronie Spies, PA-C, Robin Searing, NP, Eligha Bridegroom, NP, Tereso Newcomer, PA-C, or Perlie Gold, PA-C. Then, [employer], DO will plan to see you again in 6 month(s).{

## 2023-01-12 NOTE — Telephone Encounter (Signed)
Pt states he would like to speak with a nurse about his current medications. Please advise

## 2023-01-13 ENCOUNTER — Other Ambulatory Visit (HOSPITAL_COMMUNITY): Payer: Self-pay

## 2023-01-13 DIAGNOSIS — I4819 Other persistent atrial fibrillation: Secondary | ICD-10-CM | POA: Diagnosis not present

## 2023-01-13 DIAGNOSIS — I5032 Chronic diastolic (congestive) heart failure: Secondary | ICD-10-CM | POA: Diagnosis not present

## 2023-01-13 DIAGNOSIS — I2510 Atherosclerotic heart disease of native coronary artery without angina pectoris: Secondary | ICD-10-CM | POA: Diagnosis not present

## 2023-01-13 DIAGNOSIS — I6523 Occlusion and stenosis of bilateral carotid arteries: Secondary | ICD-10-CM | POA: Diagnosis not present

## 2023-01-13 DIAGNOSIS — E66813 Obesity, class 3: Secondary | ICD-10-CM | POA: Diagnosis not present

## 2023-01-13 DIAGNOSIS — I10 Essential (primary) hypertension: Secondary | ICD-10-CM | POA: Diagnosis not present

## 2023-01-13 DIAGNOSIS — G4733 Obstructive sleep apnea (adult) (pediatric): Secondary | ICD-10-CM | POA: Diagnosis not present

## 2023-01-13 DIAGNOSIS — I2584 Coronary atherosclerosis due to calcified coronary lesion: Secondary | ICD-10-CM | POA: Diagnosis not present

## 2023-01-13 DIAGNOSIS — E782 Mixed hyperlipidemia: Secondary | ICD-10-CM | POA: Diagnosis not present

## 2023-01-13 LAB — PRO B NATRIURETIC PEPTIDE: NT-Pro BNP: 1049 pg/mL — ABNORMAL HIGH (ref 0–376)

## 2023-01-13 LAB — BASIC METABOLIC PANEL
BUN/Creatinine Ratio: 14 (ref 10–24)
BUN: 16 mg/dL (ref 8–27)
CO2: 27 mmol/L (ref 20–29)
Calcium: 9.3 mg/dL (ref 8.6–10.2)
Chloride: 104 mmol/L (ref 96–106)
Creatinine, Ser: 1.17 mg/dL (ref 0.76–1.27)
Glucose: 110 mg/dL — ABNORMAL HIGH (ref 70–99)
Potassium: 4.6 mmol/L (ref 3.5–5.2)
Sodium: 141 mmol/L (ref 134–144)
eGFR: 68 mL/min/{1.73_m2} (ref >59)

## 2023-01-13 LAB — HEMOGLOBIN AND HEMATOCRIT, BLOOD
Hematocrit: 43.2 % (ref 37.5–51.0)
Hemoglobin: 13.8 g/dL (ref 13.0–17.7)

## 2023-01-16 ENCOUNTER — Encounter: Payer: Self-pay | Admitting: Cardiology

## 2023-01-20 ENCOUNTER — Ambulatory Visit (HOSPITAL_COMMUNITY): Admission: RE | Admit: 2023-01-20 | Payer: Medicare HMO | Source: Ambulatory Visit

## 2023-01-23 ENCOUNTER — Other Ambulatory Visit: Payer: Self-pay | Admitting: Cardiology

## 2023-01-23 ENCOUNTER — Other Ambulatory Visit: Payer: Self-pay | Admitting: Internal Medicine

## 2023-01-23 DIAGNOSIS — G629 Polyneuropathy, unspecified: Secondary | ICD-10-CM

## 2023-01-23 DIAGNOSIS — E7800 Pure hypercholesterolemia, unspecified: Secondary | ICD-10-CM

## 2023-01-23 DIAGNOSIS — F419 Anxiety disorder, unspecified: Secondary | ICD-10-CM

## 2023-01-26 ENCOUNTER — Other Ambulatory Visit: Payer: Self-pay

## 2023-01-26 DIAGNOSIS — F419 Anxiety disorder, unspecified: Secondary | ICD-10-CM

## 2023-01-26 DIAGNOSIS — G629 Polyneuropathy, unspecified: Secondary | ICD-10-CM

## 2023-01-26 MED ORDER — GABAPENTIN 300 MG PO CAPS: ORAL_CAPSULE | Freq: Three times a day (TID) | ORAL | 0 refills | Status: DC

## 2023-01-26 NOTE — Progress Notes (Signed)
Rx Refill

## 2023-01-28 ENCOUNTER — Ambulatory Visit: Payer: Self-pay | Admitting: Cardiology

## 2023-02-02 ENCOUNTER — Telehealth: Payer: Self-pay | Admitting: Cardiology

## 2023-02-02 DIAGNOSIS — I4819 Other persistent atrial fibrillation: Secondary | ICD-10-CM

## 2023-02-02 DIAGNOSIS — Z7901 Long term (current) use of anticoagulants: Secondary | ICD-10-CM

## 2023-02-02 NOTE — Telephone Encounter (Signed)
Patient calling the office for samples of medication:   1.  What medication and dosage are you requesting samples for? rivaroxaban (XARELTO) 20 MG TABS tablet  2.  Are you currently out of this medication?   No, but patient states he only has 1 dose remaining.

## 2023-02-02 NOTE — Telephone Encounter (Signed)
We gave 2 weeks of samples already. Has the patient enrolled in the xarelto with me program?

## 2023-02-03 ENCOUNTER — Other Ambulatory Visit (HOSPITAL_COMMUNITY): Payer: Self-pay

## 2023-02-03 MED ORDER — RIVAROXABAN 20 MG PO TABS: 20.0000 mg | ORAL_TABLET | Freq: Every day | ORAL | 0 refills | Status: DC

## 2023-02-03 NOTE — Telephone Encounter (Signed)
They still do, but they have more strict requirements than [employer] does for Medicare people. They require a 4% out of pocket spending based on annual income whereas [employer] only requires 3%. I can send him the Xarelto with Me information.

## 2023-02-03 NOTE — Telephone Encounter (Signed)
Mailed patient Xarelto With Me enrollment information.

## 2023-02-03 NOTE — Telephone Encounter (Signed)
Spoke with Lytle Butte RPH and Haze Rushing as patient is out of xarelto. Patient was mailed a patient assistance application today. Lytle Butte approving 2 weeks of samples. Call to patient to advise that his Xarelto with me application needs to be in process for him to potentially receive any further samples, patient verbalized understanding. Samples logged, labeled and placed at front desk.

## 2023-02-03 NOTE — Telephone Encounter (Signed)
Patient would like updates on his requesting for samples of Xarelto as he is now completely out of medication. Please advise.

## 2023-02-03 NOTE — Telephone Encounter (Signed)
No ma'am. Documentation was scanned to me requesting for [place] to complete the provider form for the [employer] PAP, which was sent to them last month. I have had extreme delays with Xarelto applications. Would changing his medication be an option or is the clinic willing to supply additional Xarelto samples?

## 2023-02-03 NOTE — Telephone Encounter (Signed)
I dont think the PAP program exists for Edward Weaver or at least they dont approved people with insurance anymore. His best bet is the Xarelto with me program where he could get it for less then the coverage gap cost ~89/month. We just gave him samples in Oct and are running very low on samples.

## 2023-02-04 ENCOUNTER — Telehealth: Payer: Self-pay | Admitting: Cardiology

## 2023-02-04 DIAGNOSIS — I4819 Other persistent atrial fibrillation: Secondary | ICD-10-CM | POA: Diagnosis not present

## 2023-02-04 DIAGNOSIS — D6869 Other thrombophilia: Secondary | ICD-10-CM | POA: Diagnosis not present

## 2023-02-04 DIAGNOSIS — I209 Angina pectoris, unspecified: Secondary | ICD-10-CM | POA: Diagnosis not present

## 2023-02-04 DIAGNOSIS — M25551 Pain in right hip: Secondary | ICD-10-CM | POA: Diagnosis not present

## 2023-02-04 DIAGNOSIS — I77810 Thoracic aortic ectasia: Secondary | ICD-10-CM | POA: Diagnosis not present

## 2023-02-04 DIAGNOSIS — I700 Atherosclerosis of aorta: Secondary | ICD-10-CM | POA: Diagnosis not present

## 2023-02-04 DIAGNOSIS — I5032 Chronic diastolic (congestive) heart failure: Secondary | ICD-10-CM | POA: Diagnosis not present

## 2023-02-04 NOTE — Telephone Encounter (Signed)
The patient's portion of the application was never sent to me. I just received a request to do the provider form, which was faxed to [facility]. They will have to reach out to the patient to obtain the other documents.

## 2023-02-04 NOTE — Telephone Encounter (Signed)
Caller (Crystal) stated they are missing page 2 of patient's application for Xarelto.

## 2023-02-07 ENCOUNTER — Emergency Department (HOSPITAL_BASED_OUTPATIENT_CLINIC_OR_DEPARTMENT_OTHER)
Admission: EM | Admit: 2023-02-07 | Discharge: 2023-02-07 | Disposition: A | Discharge status: Home / Self Care | Payer: Medicare HMO | Attending: Emergency Medicine | Admitting: Emergency Medicine

## 2023-02-07 ENCOUNTER — Encounter (HOSPITAL_BASED_OUTPATIENT_CLINIC_OR_DEPARTMENT_OTHER): Payer: Self-pay | Admitting: Emergency Medicine

## 2023-02-07 DIAGNOSIS — J45909 Unspecified asthma, uncomplicated: Secondary | ICD-10-CM | POA: Insufficient documentation

## 2023-02-07 DIAGNOSIS — I5032 Chronic diastolic (congestive) heart failure: Secondary | ICD-10-CM | POA: Insufficient documentation

## 2023-02-07 DIAGNOSIS — Z79899 Other long term (current) drug therapy: Secondary | ICD-10-CM | POA: Diagnosis not present

## 2023-02-07 DIAGNOSIS — J069 Acute upper respiratory infection, unspecified: Secondary | ICD-10-CM | POA: Diagnosis not present

## 2023-02-07 DIAGNOSIS — Z20822 Contact with and (suspected) exposure to covid-19: Secondary | ICD-10-CM | POA: Diagnosis not present

## 2023-02-07 DIAGNOSIS — Z7901 Long term (current) use of anticoagulants: Secondary | ICD-10-CM | POA: Diagnosis not present

## 2023-02-07 DIAGNOSIS — I110 Hypertensive heart disease with heart failure: Secondary | ICD-10-CM | POA: Diagnosis not present

## 2023-02-07 DIAGNOSIS — Z8616 Personal history of COVID-19: Secondary | ICD-10-CM | POA: Insufficient documentation

## 2023-02-07 DIAGNOSIS — J029 Acute pharyngitis, unspecified: Secondary | ICD-10-CM | POA: Diagnosis present

## 2023-02-07 LAB — RESP PANEL BY RT-PCR (RSV, FLU A&B, COVID)  RVPGX2
Influenza A by PCR: NEGATIVE
Influenza B by PCR: NEGATIVE
Resp Syncytial Virus by PCR: NEGATIVE
SARS Coronavirus 2 by RT PCR: NEGATIVE

## 2023-02-07 LAB — GROUP A STREP BY PCR: Group A Strep by PCR: NOT DETECTED

## 2023-02-07 NOTE — ED Provider Notes (Signed)
EMERGENCY DEPARTMENT AT [facility] Provider Note  CSN: [id number] Arrival date & time: 02/07/23 1833  Chief Complaint(s) Nasal Congestion  HPI Edward Weaver is a 68 y.o. male history of CHF, atrial fibrillation, hypertension, hyperlipidemia presenting to the emergency department with sore throat.  He reports 3 days of sore throat.  He also reports mild intermittent dry cough.  No shortness of breath.  No fevers or chills.  No chest pain, back pain, abdominal pain.  He reports some runny nose as well.  No sick contacts.   Past Medical History Past Medical History:  Diagnosis Date   Arthritis    Atrial fibrillation (HCC)    Bell's palsy    Carotid artery stenosis, asymptomatic, bilateral    Cerebrovascular disease    Chronic diastolic (congestive) heart failure (HCC)    Hiatal hernia    Hyperlipidemia    Hypertension    Neuropathy    OSA (obstructive sleep apnea)    Prediabetes    TIA (transient ischemic attack)    Nov and/or Dec 2014   Patient Active Problem List   Diagnosis Date Noted   Mild reactive airways disease 06/01/2022   Chronic diastolic heart failure (HCC) 05/06/2022   Aortic atherosclerosis (HCC) 05/06/2022   Abdominal aortic aneurysm (AAA) without rupture (HCC) 05/06/2022   Chronic diastolic (congestive) heart failure (HCC) 05/06/2022   OSA (obstructive sleep apnea) 05/06/2022   Cerebrovascular disease 05/06/2022   Hypercholesterolemia 05/06/2022   Bilateral carotid artery stenosis 05/06/2022   Essential hypertension 05/06/2022   Prediabetes 05/06/2022   BMI 45.0-49.9, adult (HCC) 05/06/2022   Morbid obesity (HCC) 05/06/2022   Bronchitis 05/06/2022   Acute cough 02/02/2022   COVID-19 02/02/2022   Dyslipidemia 05/01/2019   Neuropathy 03/08/2017   Dizziness 03/08/2017   History of atrial fibrillation 03/08/2017   A-fib (HCC) 01/10/2017   Anxiety 08/02/2013   TIA (transient ischemic attack) 08/02/2013   Visit for preventive  health examination 07/24/2013   OA (osteoarthritis) of knee 07/24/2013   Venous insufficiency of right lower extremity 07/24/2013   Pain in joint of right ankle or foot 05/14/2011   Osteochondral defect of talus 05/14/2011   Home Medication(s) Prior to Admission medications   Medication Sig Start Date End Date Taking? Authorizing Provider  albuterol (VENTOLIN HFA) 108 (90 Base) MCG/ACT inhaler Inhale 2 puffs into the lungs every 6 (six) hours as needed for wheezing or shortness of breath. 06/01/22   Crist Fat, MD  atorvastatin (LIPITOR) 40 MG tablet Take 1 tablet (40 mg total) by mouth at bedtime. 01/25/23 04/25/23  Tolia, Sunit, DO  bumetanide (BUMEX) 0.5 MG tablet Take 1 tablet (0.5 mg total) by mouth daily. 01/12/23   Tolia, Sunit, DO  fenofibrate (TRICOR) 145 MG tablet Take 1 tablet (145 mg total) by mouth daily. 01/12/23   Tolia, Sunit, DO  gabapentin (NEURONTIN) 300 MG capsule TAKE 1 CAPSULE (300 MG TOTAL) BY MOUTH 3 (THREE) TIMES DAILY. 01/26/23   Crist Fat, MD  metoprolol succinate (TOPROL-XL) 100 MG 24 hr tablet TAKE 1 TABLET BY MOUTH ONCE DAILY AT BEDTIME FOR ATRIAL FIBRILLATION 12/25/22   Tolia, Sunit, DO  rivaroxaban (XARELTO) 20 MG TABS tablet Take 1 tablet (20 mg total) by mouth daily with supper. 01/12/23   Tolia, Sunit, DO  rivaroxaban (XARELTO) 20 MG TABS tablet Take 1 tablet (20 mg total) by mouth daily with supper. 02/03/23   Tolia, Sunit, DO  sacubitril-valsartan (ENTRESTO) 24-26 MG Take 1 tablet by mouth 2 (two)  times daily. 01/12/23   Tessa Lerner, DO                                                                                                                                    Past Surgical History Past Surgical History:  Procedure Laterality Date   ESOPHAGUS SURGERY     KNEE SURGERY Right    1974   TONSILLECTOMY     WISDOM TOOTH EXTRACTION     Family History Family History  Problem Relation Age of Onset   Hypertension Mother        Living   Cancer -  Other Mother        Oral   Heart disease Mother    Hypertension Father    Alzheimer's disease Father        Living   Stroke Brother 89       Deceased   Healthy Brother        x1   Alzheimer's disease Paternal Aunt    Stroke Paternal Aunt        #1   Dementia Paternal Aunt        #2   Atrial fibrillation Son    Stroke Son    Hypertension Son     Social History Social History   Tobacco Use   Smoking status: Never   Smokeless tobacco: Never  Vaping Use   Vaping status: Never Used  Substance Use Topics   Alcohol use: No   Drug use: No   Allergies Penicillins  Review of Systems Review of Systems  All other systems reviewed and are negative.   Physical Exam Vital Signs  I have reviewed the triage vital signs BP (!) 155/79   Pulse (!) 59   Temp 97.7 F (36.5 C) (Oral)   Resp (!) 24   Ht 5\' 6"  (1.676 m)   Wt 127 kg   SpO2 100%   BMI 45.19 kg/m  Physical Exam Vitals and nursing note reviewed.  Constitutional:      General: He is not in acute distress.    Appearance: Normal appearance.  HENT:     Mouth/Throat:     Mouth: Mucous membranes are moist.     Comments: Mild posterior pharyngeal erythema, uvula midline, no tonsillar enlargement, neck supple, floor of mouth soft, normal dentition  Eyes:     Conjunctiva/sclera: Conjunctivae normal.  Cardiovascular:     Rate and Rhythm: Normal rate and regular rhythm.  Pulmonary:     Effort: Pulmonary effort is normal. No respiratory distress.     Breath sounds: Normal breath sounds. No wheezing or rales.  Abdominal:     General: Abdomen is flat.     Palpations: Abdomen is soft.     Tenderness: There is no abdominal tenderness.  Musculoskeletal:     Comments: Trace bilateral lower extremity edema  Skin:    General: Skin is warm and dry.  Capillary Refill: Capillary refill takes less than 2 seconds.  Neurological:     Mental Status: He is alert and oriented to person, place, and time. Mental status is at  baseline.  Psychiatric:        Mood and Affect: Mood normal.        Behavior: Behavior normal.     ED Results and Treatments Labs (all labs ordered are listed, but only abnormal results are displayed) Labs Reviewed  RESP PANEL BY RT-PCR (RSV, FLU A&B, COVID)  RVPGX2  GROUP A STREP BY PCR                                                                                                                          Radiology No results found.  Pertinent labs & imaging results that were available during my care of the patient were reviewed by me and considered in my medical decision making (see MDM for details).  Medications Ordered in ED Medications - No data to display                                                                                                                                   Procedures Procedures  (including critical care time)  Medical Decision Making / ED Course   MDM:  67 year old male presenting to the emergency department with sore throat.  Patient is overall well-appearing, his physical examination is reassuring, lungs are clear on exam.  He has some trace lower extremity edema but otherwise appears euvolemic.  Does have some redness to his posterior pharynx without other dangerous findings suggesting serious bacterial cause of sore throat such as retropharyngeal abscess, Ludwig's angina, peritonsillar abscess.  With his associated cough and nasal congestion, symptoms seem most likely due to a viral upper respiratory tract infection.  His COVID/flu/RSV swab is negative.  Will discharge patient to home.  Advised that he should return should he develop any shortness of breath or productive cough as he may need chest x-ray.  All questions answered. Patient comfortable with plan of discharge. Return precautions discussed with patient and specified on the after visit summary.       Lab Tests: -I ordered, reviewed, and interpreted labs.   The pertinent  results include:   Labs Reviewed  RESP PANEL BY RT-PCR (RSV, FLU A&B, COVID)  RVPGX2  GROUP A STREP BY PCR    Notable for negative results  Medicines ordered and prescription drug management: No orders of the defined types were placed in this encounter.   -I have reviewed the patients home medicines and have made adjustments as needed  Social Determinants of Health:  Diagnosis or treatment significantly limited by social determinants of health: obesity   Reevaluation: After the interventions noted above, I reevaluated the patient and found that their symptoms have improved  Co morbidities that complicate the patient evaluation  Past Medical History:  Diagnosis Date   Arthritis    Atrial fibrillation (HCC)    Bell's palsy    Carotid artery stenosis, asymptomatic, bilateral    Cerebrovascular disease    Chronic diastolic (congestive) heart failure (HCC)    Hiatal hernia    Hyperlipidemia    Hypertension    Neuropathy    OSA (obstructive sleep apnea)    Prediabetes    TIA (transient ischemic attack)    Nov and/or Dec 2014      Dispostion: Disposition decision including need for hospitalization was considered, and patient discharged from emergency department.    Final Clinical Impression(s) / ED Diagnoses Final diagnoses:  Acute upper respiratory infection     This chart was dictated using voice recognition software.  Despite best efforts to proofread,  errors can occur which can change the documentation meaning.    Lonell Grandchild, MD 02/07/23 2010

## 2023-02-07 NOTE — Discharge Instructions (Addendum)
We evaluated you for your sore throat and cough.  Your symptoms are most likely due to a viral upper respiratory tract infection.  Your COVID test was negative and your strep test was also negative.  We did not see any signs of a pneumonia on your physical examination.  Please follow-up with your primary doctor.  Please take 1000 mg of Tylenol every 6 hours as needed for your symptoms at home.  You can also try drinking hot tea with lemon as this can help with the sore throat.  If you do develop any new or worsening symptoms such as difficulty breathing, coughing up a lot of phlegm, fevers, chest pain, shortness of breath, leg swelling, abdominal pain, nausea or vomiting, please return for reassessment.

## 2023-02-07 NOTE — ED Triage Notes (Signed)
Pt c/o congestion x 3d; also c/o sore throat

## 2023-02-08 NOTE — Telephone Encounter (Signed)
After multiple attempts to reach the pt, a letter has been sent to inform the pt of [place] needing the pt portion of his PA forms.

## 2023-02-10 ENCOUNTER — Telehealth: Payer: Self-pay | Admitting: Cardiology

## 2023-02-10 DIAGNOSIS — M792 Neuralgia and neuritis, unspecified: Secondary | ICD-10-CM | POA: Diagnosis not present

## 2023-02-10 DIAGNOSIS — J069 Acute upper respiratory infection, unspecified: Secondary | ICD-10-CM | POA: Diagnosis not present

## 2023-02-10 DIAGNOSIS — I5032 Chronic diastolic (congestive) heart failure: Secondary | ICD-10-CM | POA: Diagnosis not present

## 2023-02-10 NOTE — Telephone Encounter (Signed)
Error

## 2023-02-10 NOTE — Telephone Encounter (Signed)
Pt c/o medication issue:  1. Name of Medication:   rivaroxaban (XARELTO) 20 MG TABS tablet   2. How are you currently taking this medication (dosage and times per day)?   3. Are you having a reaction (difficulty breathing--STAT)?   4. What is your medication issue?   Caller Myrene Buddy) state there is information missing (Page 2) from patient's application for this medication.

## 2023-02-22 ENCOUNTER — Telehealth: Payer: Self-pay | Admitting: Cardiology

## 2023-02-22 NOTE — Telephone Encounter (Signed)
Pt c/o medication issue:  1. Name of Medication: rivaroxaban (XARELTO) 20 MG TABS tablet   2. How are you currently taking this medication (dosage and times per day)?    3. Are you having a reaction (difficulty breathing--STAT)? no  4. What is your medication issue? Patient calling to see if we have any samples, patient is completed out of medication. States that he not able to get a prescription till he gets pd. Please advise

## 2023-02-22 NOTE — Telephone Encounter (Signed)
*  STAT* If patient is at the pharmacy, call can be transferred to refill team.   1. Which medications need to be refilled? (please list name of each medication and dose if known)   rivaroxaban (XARELTO) 20 MG TABS tablet    2. Which pharmacy/location (including street and city if local pharmacy) is medication to be sent to? [id number] - [place], [place] - [address]   3. Do they need a 30 day or 90 day supply? 90

## 2023-02-23 ENCOUNTER — Other Ambulatory Visit: Payer: Self-pay

## 2023-02-23 NOTE — Telephone Encounter (Signed)
Called Pt and he stated that he is in the [place] for the rest of the year. I informed Pt that I could give him 2 weeks of Xeralto with a form for Patient Assistance. When the form is returned he could get another 2 weeks. He verbalized understanding. I left him 2 weeks of Xeralto and paperwork at front desk.

## 2023-03-08 ENCOUNTER — Other Ambulatory Visit: Payer: Self-pay

## 2023-03-08 ENCOUNTER — Telehealth: Payer: Self-pay | Admitting: Cardiology

## 2023-03-08 ENCOUNTER — Other Ambulatory Visit (HOSPITAL_COMMUNITY): Payer: Self-pay

## 2023-03-08 ENCOUNTER — Encounter: Payer: Self-pay | Admitting: Cardiology

## 2023-03-08 DIAGNOSIS — Z7901 Long term (current) use of anticoagulants: Secondary | ICD-10-CM

## 2023-03-08 DIAGNOSIS — I4819 Other persistent atrial fibrillation: Secondary | ICD-10-CM

## 2023-03-08 NOTE — Telephone Encounter (Signed)
Attempted to reach patient, no answer. Left message that he call office back regarding samples.  Reached out to pharmacy on file ([facility]) who states he hasn't picked it up from them since June 2023.

## 2023-03-08 NOTE — Telephone Encounter (Signed)
Can we please figure out if this is a coverage gap issue or if he cannot afford the medication at baseline? I think we should put in a referral for social work.  We really cannot continue to sample constantly

## 2023-03-08 NOTE — Telephone Encounter (Signed)
His pt is requesting samples. Can you please give this to correct person?

## 2023-03-08 NOTE — Telephone Encounter (Signed)
Pt is requesting samples of [place]

## 2023-03-08 NOTE — Telephone Encounter (Signed)
Patient calling the office for samples of medication:   1.  What medication and dosage are you requesting samples for? rivaroxaban (XARELTO) 20 MG TABS tablet  2.  Are you currently out of this medication? yes

## 2023-03-09 ENCOUNTER — Other Ambulatory Visit: Payer: Self-pay

## 2023-03-09 ENCOUNTER — Other Ambulatory Visit (HOSPITAL_COMMUNITY): Payer: Self-pay

## 2023-03-09 MED ORDER — RIVAROXABAN 20 MG PO TABS
20.0000 mg | ORAL_TABLET | Freq: Every day | ORAL | 3 refills | Status: AC
  Filled 2023-03-09: qty 30, 30d supply, fill #0
  Filled 2023-03-19 (×3): qty 90, 90d supply, fill #0
  Filled 2023-06-22: qty 90, 90d supply, fill #1
  Filled 2023-06-22: qty 30, 30d supply, fill #1

## 2023-03-09 NOTE — Telephone Encounter (Signed)
Unfortunately there is not a grant for this drug. He was mailed the Xarelto with Me program info, which is what Efraim Kaufmann had advised him to do. That would put his cost at $89 monthly. His copay should reset after January 1st though.

## 2023-03-09 NOTE — Progress Notes (Signed)
Spoke with pt over the phone and explained to him that we are unable to give more samples of Xarelto. He stated he was given a new application for the Xarelto assistance program yesterday and he was advised to get that filled out as soon as possible and try to drop it back to our office Thursday (12/26) so we can fax the form out. A new rx for Xarelto was sent to the [facility] due to there not being an active prescription in the system other than the samples that had previously been given to him. Pt given the [facility] contact information and told that a new rx was being sent over. Pt verbalized understanding of the information discussed and had no further questions at this time.

## 2023-03-18 ENCOUNTER — Telehealth: Payer: Self-pay | Admitting: Cardiology

## 2023-03-18 ENCOUNTER — Other Ambulatory Visit (HOSPITAL_COMMUNITY): Payer: Self-pay

## 2023-03-18 NOTE — Telephone Encounter (Signed)
 Pt c/o medication issue:  1. Name of Medication: rivaroxaban  (XARELTO ) 20 MG TABS tablet   2. How are you currently taking this medication (dosage and times per day)?   3. Are you having a reaction (difficulty breathing--STAT)?   4. What is your medication issue? Patient is requesting call back to discuss this medication and pricing. He states that he may need assistance with this.

## 2023-03-19 ENCOUNTER — Other Ambulatory Visit (HOSPITAL_COMMUNITY): Payer: Self-pay

## 2023-03-19 ENCOUNTER — Other Ambulatory Visit: Payer: Self-pay

## 2023-03-19 NOTE — Telephone Encounter (Signed)
 Attempted to reach pt but unable to do so. LMTCB to go over information given by Haze Rushing, [employer] in regards to help with the pt's Xarelto price.

## 2023-03-19 NOTE — Telephone Encounter (Signed)
 Patient currently has not met the requirements to qualify for the [employer] patient assistance program. The only option available for patient is SHIP. Patient can visit [email or web address] or call [phone]

## 2023-03-19 NOTE — Telephone Encounter (Signed)
 Attempted to call pt again but unable to reach them and the VM box is full.

## 2023-03-24 DIAGNOSIS — H1820 Unspecified corneal edema: Secondary | ICD-10-CM | POA: Diagnosis not present

## 2023-03-29 ENCOUNTER — Encounter: Payer: Self-pay | Admitting: Cardiology

## 2023-03-29 ENCOUNTER — Ambulatory Visit: Payer: Medicare Other | Attending: Cardiology | Admitting: Cardiology

## 2023-03-29 VITALS — BP 140/74 | HR 63 | Resp 16 | Ht 66.0 in | Wt 293.0 lb

## 2023-03-29 DIAGNOSIS — I4819 Other persistent atrial fibrillation: Secondary | ICD-10-CM

## 2023-03-29 DIAGNOSIS — I6523 Occlusion and stenosis of bilateral carotid arteries: Secondary | ICD-10-CM

## 2023-03-29 DIAGNOSIS — Z7901 Long term (current) use of anticoagulants: Secondary | ICD-10-CM | POA: Diagnosis not present

## 2023-03-29 DIAGNOSIS — I2510 Atherosclerotic heart disease of native coronary artery without angina pectoris: Secondary | ICD-10-CM | POA: Diagnosis not present

## 2023-03-29 DIAGNOSIS — G459 Transient cerebral ischemic attack, unspecified: Secondary | ICD-10-CM | POA: Diagnosis not present

## 2023-03-29 DIAGNOSIS — I5032 Chronic diastolic (congestive) heart failure: Secondary | ICD-10-CM | POA: Diagnosis not present

## 2023-03-29 DIAGNOSIS — R0683 Snoring: Secondary | ICD-10-CM

## 2023-03-29 DIAGNOSIS — I10 Essential (primary) hypertension: Secondary | ICD-10-CM

## 2023-03-29 DIAGNOSIS — I2584 Coronary atherosclerosis due to calcified coronary lesion: Secondary | ICD-10-CM | POA: Diagnosis not present

## 2023-03-29 DIAGNOSIS — Z6841 Body Mass Index (BMI) 40.0 and over, adult: Secondary | ICD-10-CM

## 2023-03-29 DIAGNOSIS — R931 Abnormal findings on diagnostic imaging of heart and coronary circulation: Secondary | ICD-10-CM | POA: Diagnosis not present

## 2023-03-29 DIAGNOSIS — E7800 Pure hypercholesterolemia, unspecified: Secondary | ICD-10-CM

## 2023-03-29 DIAGNOSIS — E66813 Obesity, class 3: Secondary | ICD-10-CM

## 2023-03-29 MED ORDER — ENTRESTO 24-26 MG PO TABS: 1.0000 | ORAL_TABLET | Freq: Two times a day (BID) | ORAL | 0 refills | Status: DC

## 2023-03-29 MED ORDER — SACUBITRIL-VALSARTAN 24-26 MG PO TABS: 1.0000 | ORAL_TABLET | Freq: Two times a day (BID) | ORAL | 3 refills | Status: DC

## 2023-03-29 NOTE — Patient Instructions (Addendum)
 Medication Instructions:  Your physician recommends that you continue on your current medications as directed. Please refer to the Current Medication list given to you today.  2 weeks worth of Entresto  samples given  REFILL for Entresto  sent  *If you need a refill on your cardiac medications before your next appointment, please call your pharmacy*  Lab Work: None ordered today. If you have labs (blood work) drawn today and your tests are completely normal, you will receive your results only by: MyChart Message (if you have MyChart) OR A paper copy in the mail If you have any lab test that is abnormal or we need to change your treatment, we will call you to review the results.  Testing/Procedures: Your physician has recommended that you have a sleep study. This test records several body functions during sleep, including: brain activity, eye movement, oxygen and carbon dioxide blood levels, heart rate and rhythm, breathing rate and rhythm, the flow of air through your mouth and nose, snoring, body muscle movements, and chest and belly movement.   Follow-Up: At [facility], you and your health needs are our priority.  As part of our continuing mission to provide you with exceptional heart care, we have created designated Provider Care Teams.  These Care Teams include your primary Cardiologist (physician) and Advanced Practice Providers (APPs -  Physician Assistants and Nurse Practitioners) who all work together to provide you with the care you need, when you need it.  We recommend signing up for the patient portal called MyChart.  Sign up information is provided on this After Visit Summary.  MyChart is used to connect with patients for Virtual Visits (Telemedicine).  Patients are able to view lab/test results, encounter notes, upcoming appointments, etc.  Non-urgent messages can be sent to your provider as well.   To learn more about what you can do with MyChart, go to  [email or web address].    Your next appointment:   6 month(s)  The format for your next appointment:   In Person  Provider:   Madonna Large, DO {  Other Instructions You have been referred to sleep studies. Someone will reach out to you for an appointment.

## 2023-03-29 NOTE — Progress Notes (Signed)
 Cardiology Office Note:  .   Date:  01/12/2023  ID:  Edward Weaver, DOB [birth year], MRN [id number] PCP:  Sheldon Netter, PA  Former Cardiology Providers: None [facility] Providers Cardiologist:  Madonna Large, DO , [facility] (established care 08/13/2020) Electrophysiologist:  None  Click to update primary MD,subspecialty MD or APP then REFRESH:1}    Chief Complaint  Patient presents with   Persistent atrial fibrillation    Coronary atherosclerosis due to calcified coronary lesion   Follow-up    3 months    History of Present Illness: .   Edward Weaver is a 68 y.o. Caucasian male whose past medical history and cardiovascular risk factors includes: Moderate coronary artery calcification (214, 66th percentile), chronic HFpEF, hx of COVID, OSA not CPAP, Atrial Fibrillation, TIA, Hypertension, Hyperlipidemia, bilateral carotid artery stenosis, obesity due to excess calories.   Patient is accompanied by his wife Heron at today's office visit.  Patient is being followed by the practice for his underlying persistent atrial fibrillation, chronic HFpEF, and carotid disease.   Patient presents today for 5-month follow-up visit.  Over the last 3 months he denies anginal chest pain or heart failure symptoms.  Complains of feeling tired, fatigued, not feeling well rested, snoring.  He has a history of sleep apnea diagnosed several years ago but currently not on device therapy.  We have encouraged him to establish care with sleep medicine during multiple office visits in the past.  But this has been unsuccessful.  At the last office visit we also discussed initiation of Ozempic  to help facilitate weight loss.  Patient states that he just feels weary about taking a medication for weight loss like to lose weight.  Review of Systems: .   Review of Systems  Constitutional: Positive for malaise/fatigue. Negative for chills and fever.  HENT:  Negative for hoarse voice and nosebleeds.   Eyes:   Negative for discharge, double vision and pain.  Cardiovascular:  Positive for dyspnea on exertion (chronic and stable.). Negative for chest pain, claudication, leg swelling, near-syncope, orthopnea, palpitations, paroxysmal nocturnal dyspnea and syncope.  Respiratory:  Positive for shortness of breath and snoring. Negative for hemoptysis.   Musculoskeletal:  Negative for muscle cramps and myalgias.  Gastrointestinal:  Negative for abdominal pain, constipation, diarrhea, hematemesis, hematochezia, melena, nausea and vomiting.  Neurological:  Negative for dizziness and light-headedness.    Studies Reviewed:   EKG 12/2022:  Atrial fibrillation with slow ventricular response .  Echocardiogram: 01/11/2017: LVEF 50-55%   08/22/2020: LVEF 45-50%, moderate LVH, elevated LAP, moderately dilated left atrium, mild MR, mild TR.   07/23/2021:  Left ventricle cavity is normal in size. Moderate concentric hypertrophy of the left ventricle. Normal LV systolic function with visual EF 50-55%.  Normal global wall motion.  Unable to evaluate diastolic function due to atrial fibrillation.  Left atrial cavity is mildly dilated.  Mild to moderate mitral regurgitation.  Mild to moderate tricuspid regurgitation.  No evidence of pulmonary hypertension.  No significant change compared to previous study on 08/22/2020.   Stress Testing: Cardiac PET/CT  07/15/2022   LV perfusion is normal. There is no evidence of ischemia. There is no evidence of infarction.   Rest left ventricular function is abnormal. Rest EF: 47 %. Stress EF: 42 %. End diastolic cavity size is mildly enlarged. End systolic cavity size is mildly enlarged.   Myocardial blood flow was computed to be 0.60ml/g/min at rest and 1.27ml/g/min at stress. Global myocardial blood flow reserve was 2.12 and  was normal.   Coronary calcium  was present on the attenuation correction CT images. Moderate coronary calcifications were present. Coronary calcifications  were present in the left anterior descending artery, left circumflex artery and right coronary artery distribution(s).   Findings are consistent with no ischemia and no infarction. The study is low risk.   Prior echo suggested resting EF 50-55% suggest updating given abnormal rest/stress EF on this study  Carotid Duplex: 07/11/2021:  Right ICA disease <70%. Left ICA disease <50% See report for additional details  07/21/2022: Right ICA disease <70%  Left ICA disease <15%  Bilateral antegrade vertebral flow  81-month follow-up study recommended  RADIOLOGY: N/A (  Risk Assessment/Calculations:   Click Here to Calculate/Change CHADS2VASc Score The patient's CHADS2-VASc score is 6, indicating a 9.7% annual risk of stroke.   CHF History: Yes HTN History: Yes Diabetes History: No Stroke History: Yes Vascular Disease History: Yes    Labs:       Latest Ref Rng & Units 01/12/2023    9:33 AM 05/04/2022    2:47 PM 04/09/2022   12:51 PM  CBC  Hemoglobin 13.0 - 17.7 g/dL 86.1  84.8  85.5   Hematocrit 37.5 - 51.0 % 43.2  44.7  43.2        Latest Ref Rng & Units 01/12/2023    9:33 AM 05/04/2022    2:47 PM 04/09/2022   12:51 PM  BMP  Glucose 70 - 99 mg/dL 889  893  895   BUN 8 - 27 mg/dL 16  18  18    Creatinine 0.76 - 1.27 mg/dL 8.82  8.80  8.81   BUN/Creat Ratio 10 - 24 14  SEE NOTE:  SEE NOTE:   Sodium 134 - 144 mmol/L 141  140  141   Potassium 3.5 - 5.2 mmol/L 4.6  5.1  4.6   Chloride 96 - 106 mmol/L 104  103  105   CO2 20 - 29 mmol/L 27  30  26    Calcium  8.6 - 10.2 mg/dL 9.3  9.2  9.2       Latest Ref Rng & Units 01/12/2023    9:33 AM 05/04/2022    2:47 PM 04/09/2022   12:51 PM  CMP  Glucose 70 - 99 mg/dL 889  893  895   BUN 8 - 27 mg/dL 16  18  18    Creatinine 0.76 - 1.27 mg/dL 8.82  8.80  8.81   Sodium 134 - 144 mmol/L 141  140  141   Potassium 3.5 - 5.2 mmol/L 4.6  5.1  4.6   Chloride 96 - 106 mmol/L 104  103  105   CO2 20 - 29 mmol/L 27  30  26    Calcium  8.6 -  10.2 mg/dL 9.3  9.2  9.2   Total Protein 6.1 - 8.1 g/dL   6.7   Total Bilirubin 0.2 - 1.2 mg/dL   1.0   AST 10 - 35 U/L   11   ALT 9 - 46 U/L   22     Lab Results  Component Value Date   CHOL 137 05/04/2022   HDL 42 05/04/2022   LDLCALC 73 05/04/2022   LDLDIRECT 86 05/04/2022   TRIG 138 05/04/2022   CHOLHDL 3.3 05/04/2022   No results for input(s): LIPOA in the last 8760 hours. No components found for: NTPROBNP Recent Labs    01/12/23 0933  PROBNP 1,049*   Recent Labs    05/06/22 1604  TSH 1.740  External Labs: Collected: 08/06/2020 provided by PCP Creatinine 1 mg/dL. eGFR: 79 mL/min per 1.73 m Lipid profile: Total cholesterol 218, triglycerides 269, HDL 45, LDL 132, non HDL 173 Hemoglobin A1c: 6 TSH: 2.06  Physical Exam:    Today's Vitals   03/29/23 0847  BP: (!) 140/74  Pulse: 63  Resp: 16  SpO2: 95%  Weight: 293 lb (132.9 kg)  Height: 5' 6 (1.676 m)   Body mass index is 47.29 kg/m. Wt Readings from Last 3 Encounters:  03/29/23 293 lb (132.9 kg)  02/07/23 280 lb (127 kg)  01/12/23 287 lb 3.2 oz (130.3 kg)    Physical Exam  Constitutional: No distress.  hemodynamically stable, obese  Neck:  Unable to evaluate JVP due to short neck stature and adipose tissue  Cardiovascular: Normal rate, S1 normal and S2 normal. An irregularly irregular rhythm present. Exam reveals no gallop, no S3 and no S4.  Murmur heard. Holosystolic murmur is present with a grade of 3/6 at the apex. Pulmonary/Chest: Effort normal and breath sounds normal. No stridor. He has no wheezes. He has no rales.  Abdominal: Soft. Bowel sounds are normal. He exhibits no distension. There is no abdominal tenderness.  Abdominal obesity  Musculoskeletal:        General: Edema (+2 bilaterally) present.     Cervical back: Neck supple.  Neurological: He is alert and oriented to person, place, and time. He has intact cranial nerves (2-12).  Skin: Skin is warm.    Impression &  Recommendation(s):  Impression:   ICD-10-CM   1. Persistent atrial fibrillation (HCC)  I48.19     2. Long term (current) use of anticoagulants  Z79.01     3. Coronary atherosclerosis due to calcified coronary lesion  I25.10    I25.84     4. Agatston CAC score 200-399  R93.1     5. Chronic heart failure with preserved ejection fraction (HFpEF) (HCC)  I50.32     6. Bilateral carotid artery stenosis  I65.23     7. TIA (transient ischemic attack)  G45.9     8. Hypercholesterolemia  E78.00     9. Class 3 severe obesity due to excess calories with serious comorbidity and body mass index (BMI) of 45.0 to 49.9 in adult (HCC)  Z33.186    Z68.42    E66.01     10. Benign hypertension  I10     11. Snoring  R06.83 Ambulatory referral to Sleep Studies        Recommendation(s):  Persistent atrial fibrillation (HCC) Long term (current) use of anticoagulants Heart rate control: Metoprolol . Rhythm control: N/A. Thromboembolic prophylaxis: Xarelto . Hb is stable as of October 2024 at 13.8g/dL Does not endorse evidence of bleeding. Diagnosed with A-fib back in 2019, per patient. In the past he has refused to undergo catheter directed procedures, cardioversion, antiarrhythmic medications.  However, one of his family members  was cardioverted in the recent past and wanted to be considered for it as well.  It is going into the range of ablation we discussed initiating antiarrhythmics first prior to cardioversion to make sure that he is able to maintain sinus rhythm.  However patient does not want to be on antiarrhythmics and would like to hold off on cardioversion as well.    Coronary atherosclerosis due to calcified coronary lesion Agatston CAC score 200-399 Total CAC 214, 66 percentile Not on aspirin  as he is currently on anticoagulation. Continue statin therapy and fenofibrate . Recommend a goal LDL <55 mg/dL. Patient has  been offered to be on medication such as Ozempic /Wegovy and has also  been seen by Pharm.D. to help initiate the process.  However patient states that he remains hesitant with regard to such medication class.  Recommended that he follows up with PCP consider weight loss management program.   Chronic heart failure with preserved ejection fraction (HFpEF) (HCC) Stage B, NYHA class II/III. Denies orthopnea or PND.  On physical examination he does have bilateral lower extremity swelling. Refill less than 24/26 mg p.o. twice daily. Continue Bumex . Patient does have a scale at home but does not check it regularly.  Reemphasized its importance. Reemphasized importance of low-salt diet.  Bilateral carotid artery stenosis TIA (transient ischemic attack) Will rearrange his carotid duplex to May 2025.  Continue statin therapy, fenofibrate . Reemphasized the importance of secondary prevention with focus on improving her modifiable cardiovascular risk factors such as glycemic control, lipid management, blood pressure control, weight loss.  Hypercholesterolemia Hypertriglyceridemia Currently on Lipitor 40 mg p.o. nightly.  He denies myalgia or other side effects. Most recent lipids dated February 2024 from [facility] database, independently reviewed.  LDL 73 mg/dL and triglycerides 861 mg/dL  Class 3 severe obesity due to excess calories with serious comorbidity and body mass index (BMI) of 45.0 to 49.9 in adult [facility]) Body mass index is 47.29 kg/m. I reviewed with him importance of diet, regular physical activity/exercise, weight loss.   Patient is educated on the importance of increasing physical activity gradually as tolerated with a goal of moderate intensity exercise for 30 minutes a day 5 days a week.  Snoring:  Will place a referral for sleep apnea evaluation as well. Does not have the means to do an Brewing Technologist stud.   Orders Placed:  Orders Placed This Encounter  Procedures   Ambulatory referral to Sleep Studies    Referral Priority:   Routine    Referral Type:    Consultation    Referral Reason:   Specialty Services Required    Number of Visits Requested:   1    Final Medication List:    Meds ordered this encounter  Medications   sacubitril -valsartan  (ENTRESTO ) 24-26 MG    Sig: Take 1 tablet by mouth 2 (two) times daily.    Dispense:  180 tablet    Refill:  3    Medications Discontinued During This Encounter  Medication Reason   rivaroxaban  (XARELTO ) 20 MG TABS tablet Duplicate   rivaroxaban  (XARELTO ) 20 MG TABS tablet Duplicate   fenofibrate  (TRICOR ) 145 MG tablet Patient Preference   sacubitril -valsartan  (ENTRESTO ) 24-26 MG Reorder     Current Outpatient Medications:    atorvastatin  (LIPITOR) 40 MG tablet, Take 1 tablet (40 mg total) by mouth at bedtime., Disp: 90 tablet, Rfl: 3   bumetanide  (BUMEX ) 0.5 MG tablet, Take 1 tablet (0.5 mg total) by mouth daily., Disp: 30 tablet, Rfl: 2   gabapentin  (NEURONTIN ) 300 MG capsule, TAKE 1 CAPSULE (300 MG TOTAL) BY MOUTH 3 (THREE) TIMES DAILY., Disp: 21 capsule, Rfl: 0   metoprolol  succinate (TOPROL -XL) 100 MG 24 hr tablet, TAKE 1 TABLET BY MOUTH ONCE DAILY AT BEDTIME FOR ATRIAL FIBRILLATION, Disp: 30 tablet, Rfl: 6   rivaroxaban  (XARELTO ) 20 MG TABS tablet, Take 1 tablet (20 mg total) by mouth daily with supper., Disp: 90 tablet, Rfl: 3   albuterol  (VENTOLIN  HFA) 108 (90 Base) MCG/ACT inhaler, Inhale 2 puffs into the lungs every 6 (six) hours as needed for wheezing or shortness of breath. (Patient not taking: Reported on 03/29/2023),  Disp: 1 each, Rfl: 0   sacubitril -valsartan  (ENTRESTO ) 24-26 MG, Take 1 tablet by mouth 2 (two) times daily., Disp: 180 tablet, Rfl: 3  Consent:   N/A  Disposition:   6 month follow up   His questions and concerns were addressed to his satisfaction. He voices understanding of the recommendations provided during this encounter.    Signed, Madonna Large, DO, [facility]  [facility]  [address] [place], [place] [address] 03/29/2023 9:22 AM

## 2023-04-09 ENCOUNTER — Telehealth: Payer: Self-pay | Admitting: Cardiology

## 2023-04-09 DIAGNOSIS — R0683 Snoring: Secondary | ICD-10-CM

## 2023-04-09 DIAGNOSIS — I4819 Other persistent atrial fibrillation: Secondary | ICD-10-CM

## 2023-04-09 NOTE — Telephone Encounter (Signed)
Pt c/o swelling/edema: STAT if pt has developed SOB within 24 hours  If swelling, where is the swelling located?   Feet and legs  How much weight have you gained and in what time span?   A little  Have you gained 2 pounds in a day or 5 pounds in a week?   No  Do you have a log of your daily weights (if so, list)?   No  Are you currently taking a fluid pill?   Patient had not been taking and plans to start today  Are you currently SOB?   Yes  Have you traveled recently in a car or plane for an extended period of time?   No  Patient is concerned he has been having swelling in his legs and feet.

## 2023-04-09 NOTE — Telephone Encounter (Signed)
Spoke with pt who reports he continues to have some swelling of his feet and ankles.  Pt states he has not been taking diuretic daily as prescribed.  He denies current CP, SOB or dizziness. Stressed importance of taking medication as prescribed, following a low salt diet, elevating feet and legs when sitting.  May try compression stockings as well.  Reviewed ED precautions. Pt states he would also like to see Dr Lalla Brothers re: afib as this is the provider his son sees for his afib.  Pt advised will review with Dr Odis Hollingshead for referral.  Pt verbalizes understanding and agrees with current plan.

## 2023-04-11 NOTE — Telephone Encounter (Signed)
Yes, agree.   Not sure why he has not been taking his diuretic as prescribed. First step is to take medications as prescribed and if it continues would recommend reevaluation or going up on medications.   Sure- place a referral to Dr. Lalla Brothers (as per patient's request) non-urgent. He has been in Afib for years and in the past he had refused repeat DDCV, AAD, and medication compliance has been an issues at times as well, and he needs sleep study.   Shelley Pooley [place], DO, [facility]

## 2023-04-12 NOTE — Telephone Encounter (Signed)
Spoke with patient and discussed Dr. Emelda Brothers response:  Yes, agree.    Not sure why he has not been taking his diuretic as prescribed. First step is to take medications as prescribed and if it continues would recommend reevaluation or going up on medications.    Sure- place a referral to Dr. Lalla Brothers (as per patient's request) non-urgent. He has been in Afib for years and in the past he had refused repeat DDCV, AAD, and medication compliance has been an issues at times as well, and he needs sleep study.    Tessa Lerner, DO, [facility]     Referral for Dr. Lalla Brothers placed, an EP scheduler will contact patient to schedule appt.  Home sleep study ordered, patient will be contacted by [facility] sleep center to setup.  Patient verbalized understanding of the above. Patient reports swelling has improved some since last phone call.

## 2023-04-12 NOTE — Telephone Encounter (Signed)
Patient called to follow-up on getting a referral to Dr. Lalla Brothers.

## 2023-04-13 ENCOUNTER — Ambulatory Visit: Payer: Medicare HMO | Admitting: Physician Assistant

## 2023-04-22 ENCOUNTER — Telehealth: Payer: Self-pay | Admitting: Cardiology

## 2023-04-22 ENCOUNTER — Other Ambulatory Visit: Payer: Self-pay

## 2023-04-22 MED ORDER — SACUBITRIL-VALSARTAN 24-26 MG PO TABS: 1.0000 | ORAL_TABLET | Freq: Two times a day (BID) | ORAL | 0 refills | Status: DC

## 2023-04-22 NOTE — Progress Notes (Signed)
 Additional 2 weeks of Entresto  samples given to pt while PA forms are being reviewed.

## 2023-04-22 NOTE — Telephone Encounter (Signed)
 Patient stated he is ready to begin the [facility] medication.  Please reach out to patient to discuss.  Thank you.

## 2023-04-22 NOTE — Telephone Encounter (Signed)
 Patient dropped off papers for assistance with entresto .

## 2023-04-23 ENCOUNTER — Telehealth: Payer: Self-pay | Admitting: Pharmacy Technician

## 2023-04-23 ENCOUNTER — Telehealth: Payer: Self-pay | Admitting: *Deleted

## 2023-04-23 NOTE — Telephone Encounter (Addendum)
 PAP: Patient assistance application for Entresto  through [employer] has been mailed to pt's home address on file. Provider portion of application will be faxed to provider's office.    Sent new forms and called patient to let him know new forms are coming. The old forms are scanned in media.

## 2023-04-23 NOTE — Telephone Encounter (Signed)
 Prior Authorization for HST sent to [facility] via web portal. Tracking Number .  READY-Decision ID #: [id number] Authorization/Notification is not required for the requested service(s).

## 2023-05-07 NOTE — Telephone Encounter (Signed)
 Left a message to follow up since have not received forms

## 2023-05-21 NOTE — Telephone Encounter (Signed)
 PAP: Application for Sherryll Burger has been submitted to [employer], via fax

## 2023-05-24 NOTE — Telephone Encounter (Signed)
   I called the patient said he needs to fill out the new forms. These are the old forms. Also he needs to submit the most recent tax return. I called the pt and left a message

## 2023-05-25 NOTE — Telephone Encounter (Signed)
 PAP: Patient assistance application for Entresto through [employer] has been mailed to pt's home address on file. Provider portion of application will be faxed to provider's office.

## 2023-06-07 ENCOUNTER — Ambulatory Visit: Attending: Cardiology | Admitting: Cardiology

## 2023-06-07 ENCOUNTER — Encounter: Payer: Self-pay | Admitting: Cardiology

## 2023-06-07 VITALS — BP 122/82 | HR 63 | Resp 16 | Ht 66.0 in | Wt 300.4 lb

## 2023-06-07 DIAGNOSIS — I10 Essential (primary) hypertension: Secondary | ICD-10-CM | POA: Diagnosis not present

## 2023-06-07 DIAGNOSIS — Z6841 Body Mass Index (BMI) 40.0 and over, adult: Secondary | ICD-10-CM

## 2023-06-07 DIAGNOSIS — R931 Abnormal findings on diagnostic imaging of heart and coronary circulation: Secondary | ICD-10-CM

## 2023-06-07 DIAGNOSIS — I6523 Occlusion and stenosis of bilateral carotid arteries: Secondary | ICD-10-CM

## 2023-06-07 DIAGNOSIS — G459 Transient cerebral ischemic attack, unspecified: Secondary | ICD-10-CM

## 2023-06-07 DIAGNOSIS — E781 Pure hyperglyceridemia: Secondary | ICD-10-CM

## 2023-06-07 DIAGNOSIS — I5031 Acute diastolic (congestive) heart failure: Secondary | ICD-10-CM | POA: Diagnosis not present

## 2023-06-07 DIAGNOSIS — Z7901 Long term (current) use of anticoagulants: Secondary | ICD-10-CM | POA: Diagnosis not present

## 2023-06-07 DIAGNOSIS — I2584 Coronary atherosclerosis due to calcified coronary lesion: Secondary | ICD-10-CM

## 2023-06-07 DIAGNOSIS — I4819 Other persistent atrial fibrillation: Secondary | ICD-10-CM | POA: Diagnosis not present

## 2023-06-07 DIAGNOSIS — R0683 Snoring: Secondary | ICD-10-CM | POA: Diagnosis not present

## 2023-06-07 DIAGNOSIS — E7800 Pure hypercholesterolemia, unspecified: Secondary | ICD-10-CM

## 2023-06-07 DIAGNOSIS — E66813 Obesity, class 3: Secondary | ICD-10-CM

## 2023-06-07 DIAGNOSIS — I2510 Atherosclerotic heart disease of native coronary artery without angina pectoris: Secondary | ICD-10-CM

## 2023-06-07 MED ORDER — BUMETANIDE 1 MG PO TABS: 1.0000 mg | ORAL_TABLET | Freq: Two times a day (BID) | ORAL | 3 refills | Status: DC

## 2023-06-07 NOTE — Progress Notes (Signed)
 Cardiology Office Note:  .   Date:  06/07/23  ID:  CORDERO SURETTE, DOB 03-25-1955, MRN [id number] PCP:  Carin Hock, PA  Former Cardiology Providers: None [facility] Providers Cardiologist:  Tessa Lerner, DO , [facility] (established care 08/13/2020) Electrophysiologist:  None  Click to update primary MD,subspecialty MD or APP then REFRESH:1}    Chief Complaint  Patient presents with   Atrial Fibrillation   Follow-up    History of Present Illness: .   Edward Weaver is a 68 y.o. Caucasian male whose past medical history and cardiovascular risk factors includes: Moderate coronary artery calcification (214, 66th percentile), chronic HFpEF, hx of COVID, OSA not CPAP, Atrial Fibrillation, TIA, Hypertension, Hyperlipidemia, bilateral carotid artery stenosis, obesity due to excess calories.   Patient is accompanied by his wife [name] at today's office visit.  Patient is being followed by the practice for his underlying persistent atrial fibrillation, chronic HFpEF, and carotid disease.   Patient presents today to the office sooner than his regular scheduled visit for evaluation of lower extremity swelling.  Patient has noted that lower extremity swelling has been progressive.  He followed up with PCP and his Bumex was increased from 0.5 mg p.o. daily to 1 mg p.o. daily.  He has gained approximately 7 pounds in the last office visit.  And he endorses shortness of breath with effort related activities, orthopnea, PND, and lower extremity swelling.  He denies anginal chest pain.  Patient states that he is compliant with medical therapy.  However they do consume at least 2 meals a week which involves canned foods which may be contributory.  He has not been evaluated for sleep apnea for reasons unknown.  Reemphasized its importance.  In the past patient has been wary about being on Ozempic or similar medications in the drug class.  Review of Systems: .   Review of Systems   Constitutional: Positive for weight gain (7 pound up). Negative for chills and fever.  HENT:  Negative for hoarse voice and nosebleeds.   Eyes:  Negative for discharge, double vision and pain.  Cardiovascular:  Positive for dyspnea on exertion (chronic and stable.), leg swelling, orthopnea and paroxysmal nocturnal dyspnea. Negative for chest pain, claudication, near-syncope, palpitations and syncope.  Respiratory:  Positive for shortness of breath and snoring. Negative for hemoptysis.   Musculoskeletal:  Negative for muscle cramps and myalgias.  Gastrointestinal:  Negative for abdominal pain, constipation, diarrhea, hematemesis, hematochezia, melena, nausea and vomiting.  Neurological:  Negative for dizziness and light-headedness.    Studies Reviewed:   EKG 12/2022:  Atrial fibrillation with slow ventricular response .  Echocardiogram: 01/11/2017: LVEF 50-55%   08/22/2020: LVEF 45-50%, moderate LVH, elevated LAP, moderately dilated left atrium, mild MR, mild TR.   07/23/2021:  Left ventricle cavity is normal in size. Moderate concentric hypertrophy of the left ventricle. Normal LV systolic function with visual EF 50-55%.  Normal global wall motion.  Unable to evaluate diastolic function due to atrial fibrillation.  Left atrial cavity is mildly dilated.  Mild to moderate mitral regurgitation.  Mild to moderate tricuspid regurgitation.  No evidence of pulmonary hypertension.  No significant change compared to previous study on 08/22/2020.   Stress Testing: Cardiac PET/CT  07/15/2022   LV perfusion is normal. There is no evidence of ischemia. There is no evidence of infarction.   Rest left ventricular function is abnormal. Rest EF: 47 %. Stress EF: 42 %. End diastolic cavity size is mildly enlarged. End systolic cavity size  is mildly enlarged.   Myocardial blood flow was computed to be 0.6ml/g/min at rest and 1.55ml/g/min at stress. Global myocardial blood flow reserve was 2.12 and was  normal.   Coronary calcium was present on the attenuation correction CT images. Moderate coronary calcifications were present. Coronary calcifications were present in the left anterior descending artery, left circumflex artery and right coronary artery distribution(s).   Findings are consistent with no ischemia and no infarction. The study is low risk.   Prior echo suggested resting EF 50-55% suggest updating given abnormal rest/stress EF on this study  Carotid Duplex: 07/11/2021:  Right ICA disease <70%. Left ICA disease <50% See report for additional details  07/21/2022: Right ICA disease <70%  Left ICA disease <15%  Bilateral antegrade vertebral flow  89-month follow-up study recommended  RADIOLOGY: N/A (  Risk Assessment/Calculations:   Click Here to Calculate/Change CHADS2VASc Score The patient's CHADS2-VASc score is 5, indicating a 7.2% annual risk of stroke.   CHF History: Yes HTN History: No Diabetes History: No Stroke History: Yes Vascular Disease History: Yes    Labs:       Latest Ref Rng & Units 01/12/2023    9:33 AM 05/04/2022    2:47 PM 04/09/2022   12:51 PM  CBC  Hemoglobin 13.0 - 17.7 g/dL 40.1  02.7  25.3   Hematocrit 37.5 - 51.0 % 43.2  44.7  43.2        Latest Ref Rng & Units 01/12/2023    9:33 AM 05/04/2022    2:47 PM 04/09/2022   12:51 PM  BMP  Glucose 70 - 99 mg/dL 664  403  474   BUN 8 - 27 mg/dL 16  18  18    Creatinine 0.76 - 1.27 mg/dL 2.59  5.63  8.75   BUN/Creat Ratio 10 - 24 14  SEE NOTE:  SEE NOTE:   Sodium 134 - 144 mmol/L 141  140  141   Potassium 3.5 - 5.2 mmol/L 4.6  5.1  4.6   Chloride 96 - 106 mmol/L 104  103  105   CO2 20 - 29 mmol/L 27  30  26    Calcium 8.6 - 10.2 mg/dL 9.3  9.2  9.2       Latest Ref Rng & Units 01/12/2023    9:33 AM 05/04/2022    2:47 PM 04/09/2022   12:51 PM  CMP  Glucose 70 - 99 mg/dL 643  329  518   BUN 8 - 27 mg/dL 16  18  18    Creatinine 0.76 - 1.27 mg/dL 8.41  6.60  6.30   Sodium 134 - 144 mmol/L  141  140  141   Potassium 3.5 - 5.2 mmol/L 4.6  5.1  4.6   Chloride 96 - 106 mmol/L 104  103  105   CO2 20 - 29 mmol/L 27  30  26    Calcium 8.6 - 10.2 mg/dL 9.3  9.2  9.2   Total Protein 6.1 - 8.1 g/dL   6.7   Total Bilirubin 0.2 - 1.2 mg/dL   1.0   AST 10 - 35 U/L   11   ALT 9 - 46 U/L   22     Lab Results  Component Value Date   CHOL 137 05/04/2022   HDL 42 05/04/2022   LDLCALC 73 05/04/2022   LDLDIRECT 86 05/04/2022   TRIG 138 05/04/2022   CHOLHDL 3.3 05/04/2022   No results for input(s): "LIPOA" in the last 8760 hours.  No components found for: "NTPROBNP" Recent Labs    01/12/23 0933  PROBNP 1,049*   No results for input(s): "TSH" in the last 8760 hours.   External Labs: Collected: 08/06/2020 provided by PCP Creatinine 1 mg/dL. eGFR: 79 mL/min per 1.73 m Lipid profile: Total cholesterol 218, triglycerides 269, HDL 45, LDL 132, non HDL 173 Hemoglobin A1c: 6 TSH: 2.06  Physical Exam:    Today's Vitals   06/07/23 0808  BP: 122/82  Pulse: 63  Resp: 16  SpO2: 95%  Weight: (!) 300 lb 6.4 oz (136.3 kg)  Height: 5\' 6"  (1.676 m)   Body mass index is 48.49 kg/m. Wt Readings from Last 3 Encounters:  06/07/23 (!) 300 lb 6.4 oz (136.3 kg)  03/29/23 293 lb (132.9 kg)  02/07/23 280 lb (127 kg)    Physical Exam  Constitutional: No distress.  hemodynamically stable, obese  Neck:  Unable to evaluate JVP due to short neck stature and adipose tissue  Cardiovascular: Normal rate, S1 normal and S2 normal. An irregularly irregular rhythm present. Exam reveals no gallop, no S3 and no S4.  Murmur heard. Holosystolic murmur is present with a grade of 3/6 at the apex. Pulmonary/Chest: Effort normal and breath sounds normal. No stridor. He has no wheezes. He has no rales.  Abdominal: Soft. Bowel sounds are normal. He exhibits no distension. There is no abdominal tenderness.  Abdominal obesity  Musculoskeletal:        General: Edema (+2 bilaterally) present.     Cervical  back: Neck supple.  Neurological: He is alert and oriented to person, place, and time. He has intact cranial nerves (2-12).  Skin: Skin is warm.    Impression & Recommendation(s):  Impression:   ICD-10-CM   1. Acute heart failure with preserved ejection fraction (HFpEF) (HCC)  I50.31 Basic metabolic panel    Pro b natriuretic peptide (BNP)    Magnesium    ECHOCARDIOGRAM COMPLETE    bumetanide (BUMEX) 1 MG tablet    Magnesium    Pro b natriuretic peptide (BNP)    Basic metabolic panel    2. Persistent atrial fibrillation (HCC)  I48.19     3. Long term (current) use of anticoagulants  Z79.01     4. Coronary atherosclerosis due to calcified coronary lesion  I25.10    I25.84     5. Agatston CAC score 200-399  R93.1     6. Bilateral carotid artery stenosis  I65.23     7. TIA (transient ischemic attack)  G45.9     8. Hypercholesterolemia  E78.00     9. Benign hypertension  I10     10. Hypertriglyceridemia  E78.1     11. Snoring  R06.83     12. Class 3 severe obesity due to excess calories with serious comorbidity and body mass index (BMI) of 45.0 to 49.9 in adult [facility])  Z61.096    Z68.42    E66.01      Recommendation(s):  Acute heart failure with preserved ejection fraction (HFpEF) (HCC) Stage B, NYHA class III Weight gain since last office visit likely secondary to dietary indiscretion. Will increase Bumex from 0.5 mg p.o. daily to 1 mg p.o. twice daily Obtain baseline BMP and NT proBNP He will need repeat labs in 1 week after being on Bumex to reevaluate renal function. If he has worsening shortness of breath, orthopnea, paroxysmal nocturnal dyspnea or lower extremity swelling patient is advised to go to the hospital for parenteral diuretics. Echo will be ordered  to evaluate for structural heart disease and left ventricular systolic function. Reemphasized importance of low-salt diet and medication compliance Continue Toprol-XL 100 mg p.o. daily. Continue Entresto  24/26 mg p.o. twice daily Follow-up with APP in 6 weeks to reevaluate therapy and up titration of GDMT  Persistent atrial fibrillation (HCC) Long term (current) use of anticoagulants Rate control: Metoprolol. Rhythm control: N/A. Thromboembolic prophylaxis: Xarelto.   Diagnosed with A-fib back in 2019 and wanted to pursue conservative management.  In the interim he wanted to be considered for cardioversion as one of his family members had undergone a similar procedure.  However, in the recent past he wanted to hold off on proceeding forward with either antiarrhythmics or cardioversion.  Coronary atherosclerosis due to calcified coronary lesion Agatston CAC score 200-399 Total CAC 214, 66 percentile Not on aspirin as he is currently on anticoagulation. Continue statin therapy and fenofibrate. Recommend a goal LDL <55 mg/dL. Patient has been hesitant to be on pharmacological therapy to help facilitate weight loss.  Bilateral carotid artery stenosis TIA (transient ischemic attack) Repeat carotid duplex in May 2025. Currently on statin therapy and fenofibrate. Secondary prevention.  Snoring Patient still has not been evaluated by sleep medicine.  Reemphasized its importance patient will follow-up with PCP. Itamar studies consider but due limitation in resources it was not pursued.  Orders Placed:  Orders Placed This Encounter  Procedures   Basic metabolic panel    Standing Status:   Future    Number of Occurrences:   1    Expected Date:   06/07/2023    Expiration Date:   06/06/2024   Pro b natriuretic peptide (BNP)    Standing Status:   Future    Number of Occurrences:   1    Expected Date:   06/07/2023    Expiration Date:   06/06/2024   Magnesium    Standing Status:   Future    Number of Occurrences:   1    Expected Date:   06/07/2023    Expiration Date:   06/06/2024   ECHOCARDIOGRAM COMPLETE    Standing Status:   Future    Expected Date:   06/14/2023    Expiration Date:    06/06/2024    Where should this test be performed:   [facility])    Does the patient weigh less than or greater than 250 lbs?:   Patient weighs greater than 250 lbs             300 lbs    Perflutren DEFINITY (image enhancing agent) should be administered unless hypersensitivity or allergy exist:   Administer Perflutren    Reason for exam-Echo:   Other-Full Diagnosis List    Full ICD-10/Reason for Exam:   (HFpEF) heart failure with preserved ejection fraction (HCC) [[id number]]    Final Medication List:    Meds ordered this encounter  Medications   bumetanide (BUMEX) 1 MG tablet    Sig: Take 1 tablet (1 mg total) by mouth 2 (two) times daily.    Dispense:  60 tablet    Refill:  3    Increasing to 1 mg twice daily    Medications Discontinued During This Encounter  Medication Reason   sacubitril-valsartan (ENTRESTO) 24-26 MG Duplicate   sacubitril-valsartan (ENTRESTO) 24-26 MG Duplicate   bumetanide (BUMEX) 0.5 MG tablet      Current Outpatient Medications:    albuterol (VENTOLIN HFA) 108 (90 Base) MCG/ACT inhaler, Inhale 2 puffs into the lungs every  6 (six) hours as needed for wheezing or shortness of breath., Disp: 1 each, Rfl: 0   atorvastatin (LIPITOR) 40 MG tablet, Take 1 tablet (40 mg total) by mouth at bedtime., Disp: 90 tablet, Rfl: 3   gabapentin (NEURONTIN) 300 MG capsule, TAKE 1 CAPSULE (300 MG TOTAL) BY MOUTH 3 (THREE) TIMES DAILY., Disp: 21 capsule, Rfl: 0   metoprolol succinate (TOPROL-XL) 100 MG 24 hr tablet, TAKE 1 TABLET BY MOUTH ONCE DAILY AT BEDTIME FOR ATRIAL FIBRILLATION, Disp: 30 tablet, Rfl: 6   rivaroxaban (XARELTO) 20 MG TABS tablet, Take 1 tablet (20 mg total) by mouth daily with supper., Disp: 90 tablet, Rfl: 3   sacubitril-valsartan (ENTRESTO) 24-26 MG, Take 1 tablet by mouth 2 (two) times daily., Disp: 180 tablet, Rfl: 3   bumetanide (BUMEX) 1 MG tablet, Take 1 tablet (1 mg total) by mouth 2 (two) times daily., Disp: 60 tablet, Rfl:  3  Consent:   N/A  Disposition:   6 month follow up   His questions and concerns were addressed to his satisfaction. He voices understanding of the recommendations provided during this encounter.    Signed, Tessa Lerner, DO, [facility]  [facility]  [address] [place], [place] [address] 06/07/2023 7:44 PM

## 2023-06-07 NOTE — Patient Instructions (Signed)
 Medication Instructions:  Your physician has recommended you make the following change in your medication:   INCREASE Bumex to 1 mg twice daily    *If you need a refill on your cardiac medications before your next appointment, please call your pharmacy*  Lab Work: To be completed today: BMP, Pro-BNP, and magnesium  If you have labs (blood work) drawn today and your tests are completely normal, you will receive your results only by: MyChart Message (if you have MyChart) OR A paper copy in the mail If you have any lab test that is abnormal or we need to change your treatment, we will call you to review the results.  Testing/Procedures: Your physician has requested that you have an echocardiogram. Echocardiography is a painless test that uses sound waves to create images of your heart. It provides your doctor with information about the size and shape of your heart and how well your heart's chambers and valves are working. This procedure takes approximately one hour. There are no restrictions for this procedure. Please do NOT wear cologne, perfume, aftershave, or lotions (deodorant is allowed). Please arrive 15 minutes prior to your appointment time.  Please note: We ask at that you not bring children with you during ultrasound (echo/ vascular) testing. Due to room size and safety concerns, children are not allowed in the ultrasound rooms during exams. Our front office staff cannot provide observation of children in our lobby area while testing is being conducted. An adult accompanying a patient to their appointment will only be allowed in the ultrasound room at the discretion of the ultrasound technician under special circumstances. We apologize for any inconvenience.   Follow-Up: At [facility], you and your health needs are our priority.  As part of our continuing mission to provide you with exceptional heart care, we have created designated Provider Care Teams.  These Care Teams include  your primary Cardiologist (physician) and Advanced Practice Providers (APPs -  Physician Assistants and Nurse Practitioners) who all work together to provide you with the care you need, when you need it.  We recommend signing up for the patient portal called "MyChart".  Sign up information is provided on this After Visit Summary.  MyChart is used to connect with patients for Virtual Visits (Telemedicine).  Patients are able to view lab/test results, encounter notes, upcoming appointments, etc.  Non-urgent messages can be sent to your provider as well.   To learn more about what you can do with MyChart, go to [email or web address].    Your next appointment:   6 week(s)  The format for your next appointment:   In Person  Provider:   Jari Favre, PA-C, Ronie Spies, PA-C, Robin Searing, NP, Jacolyn Reedy, PA-C, Eligha Bridegroom, NP, Tereso Newcomer, PA-C, or Perlie Gold, PA-C. Then, [employer], DO will plan to see you again in 6 month(s).{  Other Instructions Follow up with your primary care provider to discuss having a sleep study.

## 2023-06-08 ENCOUNTER — Other Ambulatory Visit (HOSPITAL_COMMUNITY): Payer: Self-pay

## 2023-06-08 NOTE — Telephone Encounter (Signed)
 Called the patient to follow up since we have not heard back about their application  Copy of first 2 pages of most recent tax return [phone] and sign/date patient the app form from 05/24/23

## 2023-06-15 NOTE — Telephone Encounter (Signed)
 2nd- Called the patient to follow up since we have not heard back about their application  Copy of first 2 pages of most recent tax return [phone] and sign/date patient the app form from 05/24/23

## 2023-06-17 NOTE — Progress Notes (Deleted)
  Electrophysiology Office Note:    Date:  06/17/2023   ID:  Edward Weaver, Edward Weaver 1955-09-10, MRN [id number]  [facility] Cardiologist:  Tessa Lerner, DO  [facility] Electrophysiologist:  Lanier Prude, MD   Referring MD: Carin Hock, PA   Chief Complaint: Atrial fibrillation  History of Present Illness:    Edward Weaver is a 68 year old man who I am seeing today for an evaluation of atrial fibrillation at the request of Dr. Odis Hollingshead.  The patient has a history of chronic diastolic heart failure, sleep apnea not on CPAP, atrial fibrillation, TIA, hypertension, hyperlipidemia, carotid artery stenosis and obesity.  He last saw the clinic June 07, 2023 with an increased amount of lower extremity swelling.  At that appointment his Bumex was increased to twice daily.  An echo was ordered.  His atrial fibrillation was diagnosed in 2019.  Cardioversion was initially offered but the patient declined.      Their past medical, social and family history was reviewed.   ROS:   Please see the history of present illness.    All other systems reviewed and are negative.  EKGs/Labs/Other Studies Reviewed:    The following studies were reviewed today:  Jul 27, 2021 echo ([facility]) EF 50 to 55% Atrial fibrillation Mild to moderate MR Moderate TR  January 12, 2023 EKG shows atrial fibrillation with a ventricular rate of 56 bpm.  QTc 416 ms.    All EKGs from 2018, 2019, 2020, 2022 and 2023 were reviewed and show atrial fibrillation  May 20, 2015 EKG shows sinus rhythm.         Physical Exam:    VS:  There were no vitals taken for this visit.    Wt Readings from Last 3 Encounters:  06/07/23 (!) 300 lb 6.4 oz (136.3 kg)  03/29/23 293 lb (132.9 kg)  02/07/23 280 lb (127 kg)     GEN: no distress.  Obese CARD: Irregularly irregular, No MRG RESP: No IWOB. CTAB.        ASSESSMENT AND PLAN:    1. Persistent atrial fibrillation (HCC)   2. Chronic diastolic heart failure  (HCC)     #Longstanding persistent atrial fibrillation Has been out of rhythm at least since 2017.  He has dilated atrium.  He is obese and has untreated sleep apnea.  I discussed treatment options with the patient during today's clinic appointment.  We discussed antiarrhythmic drugs, cardioversion and catheter ablation.  Given his untreated sleep apnea, body weight, chronicity of A-fib, I would like to start with antiarrhythmic drugs and catheter ablation to see if this will help his symptoms and improve his diastolic heart failure.  He will also need an echocardiogram.  Given his young age, I would like to avoid amiodarone.  I discussed Tikosyn in detail with the patient including the risks and need for inpatient hospitalization and he is interested in proceeding.  Continue Xarelto  #Chronic diastolic heart failure NYHA class III.  Rhythm control would be ideal.  Continue Bumex, metoprolol, Entresto.    Signed, Rossie Muskrat. Lalla Brothers, MD, [facility], [facility] 06/17/2023 1:46 PM    Electrophysiology [facility]

## 2023-06-18 ENCOUNTER — Ambulatory Visit: Payer: Medicare Other | Attending: Internal Medicine | Admitting: Cardiology

## 2023-06-18 DIAGNOSIS — I5032 Chronic diastolic (congestive) heart failure: Secondary | ICD-10-CM

## 2023-06-18 DIAGNOSIS — I4819 Other persistent atrial fibrillation: Secondary | ICD-10-CM

## 2023-06-22 ENCOUNTER — Other Ambulatory Visit (HOSPITAL_COMMUNITY): Payer: Self-pay

## 2023-06-23 NOTE — Telephone Encounter (Signed)
 3rd-Called the patient to follow up since we have not heard back about their application

## 2023-06-24 ENCOUNTER — Other Ambulatory Visit (HOSPITAL_COMMUNITY): Payer: Self-pay

## 2023-06-26 DIAGNOSIS — I5032 Chronic diastolic (congestive) heart failure: Secondary | ICD-10-CM | POA: Diagnosis not present

## 2023-06-26 DIAGNOSIS — I4819 Other persistent atrial fibrillation: Secondary | ICD-10-CM | POA: Diagnosis not present

## 2023-06-26 DIAGNOSIS — I10 Essential (primary) hypertension: Secondary | ICD-10-CM | POA: Diagnosis not present

## 2023-06-26 DIAGNOSIS — I2584 Coronary atherosclerosis due to calcified coronary lesion: Secondary | ICD-10-CM | POA: Diagnosis not present

## 2023-06-27 IMAGING — DX DG CHEST 1V PORT
1 series · 1 of 1 positions shown · non-contrast
Comparison: 05/20/2018

CLINICAL DATA: Chest pain for 1 hour, shortness of breath for week

EXAM:
PORTABLE CHEST 1 VIEW

[chest ap]
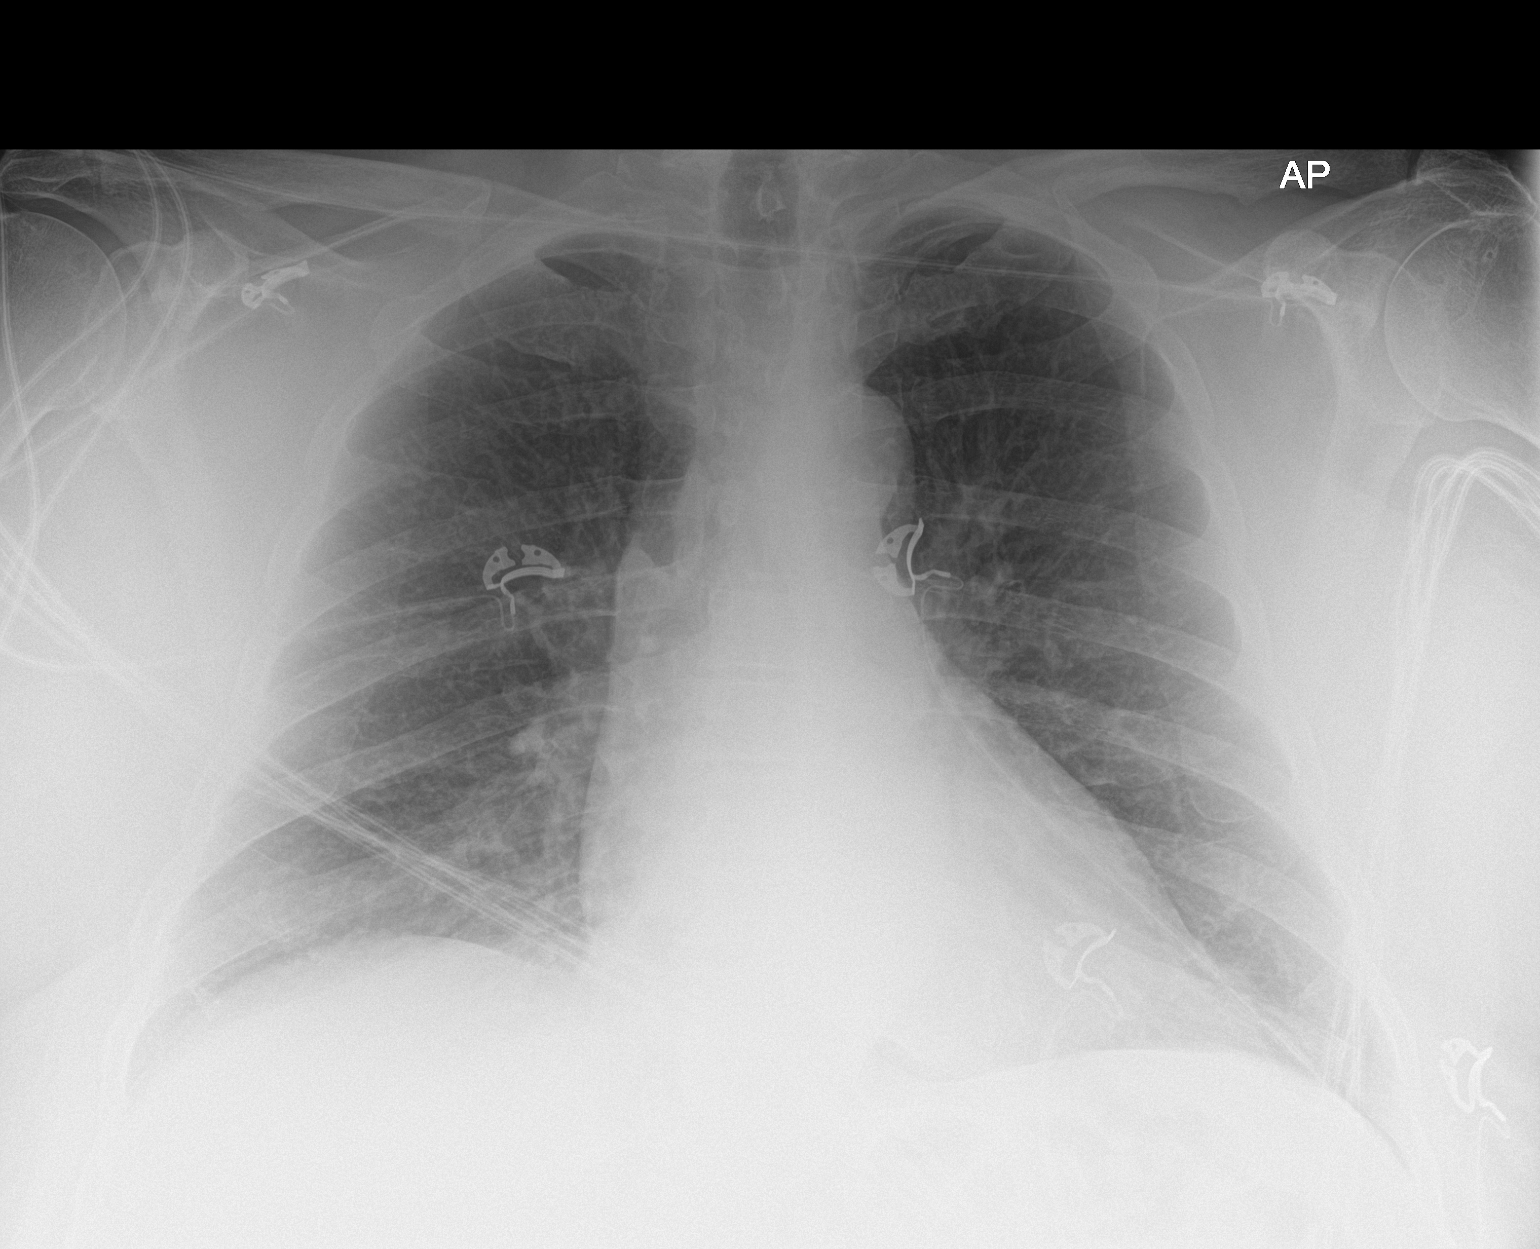

[1 of 1 positions shown; findings below may reference images not displayed]

FINDINGS: Low lung volumes. Normal cardiac and mediastinal contours when
accounting for technique and low lung volumes. No focal pulmonary
opacity. Bibasilar atelectasis. No pleural effusion or pneumothorax.
No acute osseous abnormality.
IMPRESSION: No active disease.

## 2023-06-30 ENCOUNTER — Ambulatory Visit (HOSPITAL_COMMUNITY): Attending: Cardiology

## 2023-06-30 NOTE — Telephone Encounter (Signed)
 4th- called patient -never heard back

## 2023-07-06 DIAGNOSIS — I10 Essential (primary) hypertension: Secondary | ICD-10-CM | POA: Diagnosis not present

## 2023-07-06 DIAGNOSIS — I4891 Unspecified atrial fibrillation: Secondary | ICD-10-CM | POA: Diagnosis not present

## 2023-07-06 DIAGNOSIS — Z880 Allergy status to penicillin: Secondary | ICD-10-CM | POA: Diagnosis not present

## 2023-07-06 DIAGNOSIS — I5032 Chronic diastolic (congestive) heart failure: Secondary | ICD-10-CM | POA: Diagnosis not present

## 2023-07-06 DIAGNOSIS — I2510 Atherosclerotic heart disease of native coronary artery without angina pectoris: Secondary | ICD-10-CM | POA: Diagnosis not present

## 2023-07-06 DIAGNOSIS — R609 Edema, unspecified: Secondary | ICD-10-CM | POA: Diagnosis not present

## 2023-07-06 DIAGNOSIS — G4733 Obstructive sleep apnea (adult) (pediatric): Secondary | ICD-10-CM | POA: Diagnosis not present

## 2023-07-06 DIAGNOSIS — I110 Hypertensive heart disease with heart failure: Secondary | ICD-10-CM | POA: Diagnosis not present

## 2023-07-06 DIAGNOSIS — E785 Hyperlipidemia, unspecified: Secondary | ICD-10-CM | POA: Diagnosis not present

## 2023-07-07 ENCOUNTER — Encounter (HOSPITAL_COMMUNITY): Payer: Self-pay | Admitting: Cardiology

## 2023-07-07 NOTE — Telephone Encounter (Signed)
 Closing since never heard back from patient

## 2023-07-14 DIAGNOSIS — I5032 Chronic diastolic (congestive) heart failure: Secondary | ICD-10-CM | POA: Diagnosis not present

## 2023-07-14 DIAGNOSIS — I2510 Atherosclerotic heart disease of native coronary artery without angina pectoris: Secondary | ICD-10-CM | POA: Diagnosis not present

## 2023-07-14 DIAGNOSIS — I2584 Coronary atherosclerosis due to calcified coronary lesion: Secondary | ICD-10-CM | POA: Diagnosis not present

## 2023-07-14 DIAGNOSIS — I10 Essential (primary) hypertension: Secondary | ICD-10-CM | POA: Diagnosis not present

## 2023-07-14 DIAGNOSIS — I4819 Other persistent atrial fibrillation: Secondary | ICD-10-CM | POA: Diagnosis not present

## 2023-07-14 DIAGNOSIS — E782 Mixed hyperlipidemia: Secondary | ICD-10-CM | POA: Diagnosis not present

## 2023-07-20 DIAGNOSIS — M7989 Other specified soft tissue disorders: Secondary | ICD-10-CM | POA: Diagnosis not present

## 2023-07-20 DIAGNOSIS — I5032 Chronic diastolic (congestive) heart failure: Secondary | ICD-10-CM | POA: Diagnosis not present

## 2023-07-20 DIAGNOSIS — R7303 Prediabetes: Secondary | ICD-10-CM | POA: Diagnosis not present

## 2023-07-20 DIAGNOSIS — G4733 Obstructive sleep apnea (adult) (pediatric): Secondary | ICD-10-CM | POA: Diagnosis not present

## 2023-07-22 NOTE — Progress Notes (Deleted)
 Cardiology Office Note    Patient Name: Edward Weaver Date of Encounter: 07/22/2023  Primary Care Provider:  Dickey Fought, PA Primary Cardiologist:  Olinda Bertrand, DO Primary Electrophysiologist: Boyce Byes, MD   Past Medical History    Past Medical History:  Diagnosis Date   Arthritis    Atrial fibrillation [facility])    Bell's palsy    Carotid artery stenosis, asymptomatic, bilateral    Cerebrovascular disease    Chronic diastolic (congestive) heart failure (HCC)    Hiatal hernia    Hyperlipidemia    Hypertension    Neuropathy    OSA (obstructive sleep apnea)    Prediabetes    TIA (transient ischemic attack)    Nov and/or Dec 2014    History of Present Illness  Edward Weaver is a 68 y.o. male with a PMH of CAD, HFpEF, OSA (not on CPAP), atrial fibrillation, TIA, HTN, HLD, bilateral carotid stenosis (50-69%), obesity who presents today for 6-week follow-up.  Edward Weaver was seen initially by Dr. Albert Huff on 08/13/2020 for management of newly diagnosed atrial fibrillation.  He was on Xarelto  and rate control.  He reported  chest pain and shortness of breath during visit.  He completed 2D echo that showed reduced EF of 45 to 50% with mild valvular disease and elevated left atrial pressures.  Lexiscan  stress test was also completed that was normal with diaphragmatic attenuation noted.  He was started on GDMT with Entresto .  He completed a carotid Doppler as well that showed bilateral 50-69% stenosis.  He was referred for reevaluation of OSA and has chosen not to complete at that time.  He was previously diagnosed with sleep apnea but deferred CPAP.  He struggled with medication compliance and has been educated multiple times about the importance of compliance.  He has refused rhythm control as well as catheter regular procedures and cardioversions in the past.  He completed a CT in 07/2022 that showed no evidence of ischemia with normal LV patient.   He completed surveillance carotid  duplex on 07/21/2022 showed mild progression of stenosis in the left ICA no significant change on the right.  He was last seen by Dr. Albert Huff on 06/03/2023 ongoing lower extremity swelling.  This has been progressive and Bumex  was increased from 0.5 to 1 mg and patient was noted to have gained 7 pounds in between previous office visit.  He reported compliance with his medications but did endorse some sodium indiscretions.  He was advised to increase Bumex  to 1 mg p.o. twice daily and was advised to go to the hospital for diuresis if shortness of breath remain progressive and if lower extremity edema remained unchanged with increased diuretic.  2D echo was also ordered for reevaluation of LV function.  He was seen in the ED on 07/06/2023 valuation of lower extremity swelling.  He did not have any labs completed as were stable and was advised to proceed with increasing Bumex  to twice daily.   Patient denies chest pain, palpitations, dyspnea, PND, orthopnea, nausea, vomiting, dizziness, syncope, edema, weight gain, or early satiety.   Discussed the use of AI scribe software for clinical note transcription with the patient, who gave verbal consent to proceed.  History of Present Illness    ***Notes: Patient needs repeat echo and carotid duplex. -Last ischemic evaluation:  Review of Systems  Please see the history of present illness.    All other systems reviewed and are otherwise negative except as noted above.  Physical Exam  Wt Readings from Last 3 Encounters:  06/07/23 (!) 300 lb 6.4 oz (136.3 kg)  03/29/23 293 lb (132.9 kg)  02/07/23 280 lb (127 kg)   ZO:XWRUE were no vitals filed for this visit.,There is no height or weight on file to calculate BMI. GEN: Well nourished, well developed in no acute distress Neck: No JVD; No carotid bruits Pulmonary: Clear to auscultation without rales, wheezing or rhonchi  Cardiovascular: Normal rate. Regular rhythm. Normal S1. Normal S2.   Murmurs: There  is no murmur.  ABDOMEN: Soft, non-tender, non-distended EXTREMITIES:  No edema; No deformity   EKG/LABS/ Recent Cardiac Studies   ECG personally reviewed by me today - ***  Risk Assessment/Calculations:   {Does this patient have ATRIAL FIBRILLATION?:[id number]}      Lab Results  Component Value Date   WBC 6.4 12/26/2021   HGB 13.8 01/12/2023   HCT 43.2 01/12/2023   MCV 88.3 12/26/2021   PLT 286 12/26/2021   Lab Results  Component Value Date   CREATININE 1.17 01/12/2023   BUN 16 01/12/2023   NA 141 01/12/2023   K 4.6 01/12/2023   CL 104 01/12/2023   CO2 27 01/12/2023   Lab Results  Component Value Date   CHOL 137 05/04/2022   HDL 42 05/04/2022   LDLCALC 73 05/04/2022   LDLDIRECT 86 05/04/2022   TRIG 138 05/04/2022   CHOLHDL 3.3 05/04/2022    Lab Results  Component Value Date   HGBA1C 6.2 (H) 05/06/2022   Assessment & Plan    1.  HFrEF: - 2D echo completed on 07/23/2021 with moderate concentric LVH and EF of 50-55% with inability to evaluate LV function due to current AF with mild to moderate MR and TR  2.  Persistent atrial fibrillation: -  3.  Coronary artery disease  4.  Bilateral carotid stenosis: - Patient's last surveillance carotid duplex was completed on 07/21/2022 showing 1-50% left internal carotid stenosis with 50% external carotid artery stenosis and right internal carotid 50 to 69%  5.  History of sleep apnea:      Disposition: Follow-up with Sunit Tolia, DO or APP in *** months {Are you ordering a CV Procedure (e.g. stress test, cath, DCCV, TEE, etc)?   Press F2        :[id number]}   Signed, Francene Ing, Retha Cast, NP 07/22/2023, 7:33 AM [facility]

## 2023-07-23 ENCOUNTER — Ambulatory Visit: Attending: Nurse Practitioner | Admitting: Nurse Practitioner

## 2023-07-23 DIAGNOSIS — I6523 Occlusion and stenosis of bilateral carotid arteries: Secondary | ICD-10-CM

## 2023-07-23 DIAGNOSIS — I2510 Atherosclerotic heart disease of native coronary artery without angina pectoris: Secondary | ICD-10-CM

## 2023-07-23 DIAGNOSIS — I5031 Acute diastolic (congestive) heart failure: Secondary | ICD-10-CM

## 2023-07-23 DIAGNOSIS — I4819 Other persistent atrial fibrillation: Secondary | ICD-10-CM

## 2023-07-23 DIAGNOSIS — G4733 Obstructive sleep apnea (adult) (pediatric): Secondary | ICD-10-CM

## 2023-07-26 ENCOUNTER — Encounter: Payer: Self-pay | Admitting: Cardiology

## 2023-07-28 ENCOUNTER — Ambulatory Visit: Admitting: Cardiology

## 2023-07-29 DIAGNOSIS — I5032 Chronic diastolic (congestive) heart failure: Secondary | ICD-10-CM | POA: Diagnosis not present

## 2023-07-29 DIAGNOSIS — I10 Essential (primary) hypertension: Secondary | ICD-10-CM | POA: Diagnosis not present

## 2023-07-29 DIAGNOSIS — I2584 Coronary atherosclerosis due to calcified coronary lesion: Secondary | ICD-10-CM | POA: Diagnosis not present

## 2023-07-29 DIAGNOSIS — I4819 Other persistent atrial fibrillation: Secondary | ICD-10-CM | POA: Diagnosis not present

## 2023-07-29 NOTE — Progress Notes (Unsigned)
 Electrophysiology Office Note:    Date:  07/30/2023   ID:  Edward Weaver, DOB 26-Dec-1955, MRN [id number]  [facility] Cardiologist:  Olinda Bertrand, DO  [facility] Electrophysiologist:  Boyce Byes, MD   Referring MD: Dickey Fought, PA   Chief Complaint: Atrial fibrillation  History of Present Illness:    Edward Weaver is a 68 year old man who I am seeing today for an evaluation of atrial fibrillation at the request of Dr. Jackey Mary.  The patient has a history of chronic diastolic heart failure, sleep apnea, atrial fibrillation, TIA, hypertension, hyperlipidemia, carotid artery stenosis, obesity.  The patient last saw Dr. Jackey Mary March 29, 2023.  His atrial fibrillation was diagnosed in 2019.  The patient has been hesitant to pursue rhythm control in the past.  He is with his wife today in clinic.  He describes progressively worsening lower extremity edema and abdominal bloating.  He he describes daily "panic attacks" that are associated with trouble breathing.  They sound like PND like episodes.  He does not watch how much fluid he takes in daily.  He has not been taking his Bumex  as prescribed.  He has not been taking Entresto  at all.      Their past medical, social and family history was reviewed.   ROS:   Please see the history of present illness.    All other systems reviewed and are negative.  EKGs/Labs/Other Studies Reviewed:    The following studies were reviewed today:  Jul 27, 2021 echo EF 50 to 55% Dilated left atrium  June 28, 2014 EKG shows sinus rhythm May 20, 2015 EKG shows sinus rhythm January 10, 2017 EKG shows atrial fibrillation      Physical Exam:    VS:  BP 96/68   Pulse 60   Ht 5\' 6"  (1.676 m)   Wt 298 lb (135.2 kg)   SpO2 95%   BMI 48.10 kg/m     Wt Readings from Last 3 Encounters:  07/30/23 298 lb (135.2 kg)  06/07/23 (!) 300 lb 6.4 oz (136.3 kg)  03/29/23 293 lb (132.9 kg)     GEN: no distress.  Obese.  Chronically  ill-appearing CARD: Irregularly irregular, 2+ pitting bilateral lower extremity edema to the knees RESP: No IWOB. CTAB.        ASSESSMENT AND PLAN:    1. Permanent atrial fibrillation (HCC)   2. Chronic diastolic heart failure (HCC)    #Acutely decompensated diastolic heart failure Likely secondary to dietary indiscretion, medication regimen nonadherence (has not been taking Entresto  or Bumex ).  He has NYHA class III symptoms.  He is significantly volume overloaded on exam.  I recommended presentation/admission to the hospital today but the patient declined and would rather try to manage this as an outpatient until Monday.  I will have him seen in the heart failure clinic on Monday or Tuesday.  I did tell him that if he gets any worse in the next 48 hours he should immediately present to the emergency department.  #atrial fibrillation Likely permanent. The patient has been in atrial fibrillation since 2018.  He has normal left ventricular function.  Unfortunately, given the chronicity of his atrial fibrillation and left atrial dilation he is not a good candidate for rhythm control.  Recommend continuing his Xarelto  for stroke prophylaxis.  If he were to become euvolemic, could give him a trial of sinus rhythm with a cardioversion but it is unlikely to work.  This was discussed in detail with the patient  today.  Continue metoprolol    Follow-up on Monday or Tuesday with the heart failure clinic.  ER precautions given.      Signed, Leanora Prophet. Marven Slimmer, MD, [facility], [facility] 07/30/2023 8:46 AM    Electrophysiology [facility]

## 2023-07-30 ENCOUNTER — Ambulatory Visit: Attending: Cardiology | Admitting: Cardiology

## 2023-07-30 VITALS — BP 96/68 | HR 60 | Ht 66.0 in | Wt 298.0 lb

## 2023-07-30 DIAGNOSIS — I5032 Chronic diastolic (congestive) heart failure: Secondary | ICD-10-CM | POA: Diagnosis not present

## 2023-07-30 DIAGNOSIS — I4821 Permanent atrial fibrillation: Secondary | ICD-10-CM | POA: Diagnosis not present

## 2023-07-30 MED ORDER — SACUBITRIL-VALSARTAN 24-26 MG PO TABS: 1.0000 | ORAL_TABLET | Freq: Two times a day (BID) | ORAL | 3 refills | Status: AC

## 2023-07-30 MED ORDER — METOPROLOL SUCCINATE ER 50 MG PO TB24: 50.0000 mg | ORAL_TABLET | Freq: Every day | ORAL | 3 refills | Status: DC

## 2023-07-30 NOTE — Addendum Note (Signed)
 Addended by: CHAUVIGNE, Mana Haberl on: 07/30/2023 08:58 AM   Modules accepted: Orders

## 2023-07-30 NOTE — Addendum Note (Signed)
 Addended by: CHAUVIGNE, Heydi Swango on: 07/30/2023 08:57 AM   Modules accepted: Orders

## 2023-07-30 NOTE — Patient Instructions (Signed)
 Medication Instructions:  Your physician has recommended you make the following change in your medication:  1) DECREASE metoprolol  to 50 mg daily  *If you need a refill on your cardiac medications before your next appointment, please call your pharmacy*  Follow-Up: At [facility], you and your health needs are our priority.  As part of our continuing mission to provide you with exceptional heart care, our providers are all part of one team.  This team includes your primary Cardiologist (physician) and Advanced Practice Providers or APPs (Physician Assistants and Nurse Practitioners) who all work together to provide you with the care you need, when you need it.  Your next appointment:   Heart Failure Clinic will contact you to schedule an appointment

## 2023-08-02 ENCOUNTER — Encounter (HOSPITAL_COMMUNITY): Payer: Self-pay | Admitting: Internal Medicine

## 2023-08-02 ENCOUNTER — Ambulatory Visit (HOSPITAL_COMMUNITY)
Admission: RE | Admit: 2023-08-02 | Discharge: 2023-08-02 | Disposition: A | Discharge status: Home / Self Care | Source: Ambulatory Visit | Attending: Internal Medicine | Admitting: Internal Medicine

## 2023-08-02 ENCOUNTER — Telehealth (HOSPITAL_COMMUNITY): Payer: Self-pay | Admitting: Pharmacy Technician

## 2023-08-02 ENCOUNTER — Other Ambulatory Visit (HOSPITAL_COMMUNITY): Payer: Self-pay

## 2023-08-02 VITALS — BP 110/71 | HR 63 | Ht 66.0 in | Wt 294.2 lb

## 2023-08-02 DIAGNOSIS — E785 Hyperlipidemia, unspecified: Secondary | ICD-10-CM | POA: Diagnosis not present

## 2023-08-02 DIAGNOSIS — Z7901 Long term (current) use of anticoagulants: Secondary | ICD-10-CM | POA: Insufficient documentation

## 2023-08-02 DIAGNOSIS — I4819 Other persistent atrial fibrillation: Secondary | ICD-10-CM | POA: Diagnosis not present

## 2023-08-02 DIAGNOSIS — I110 Hypertensive heart disease with heart failure: Secondary | ICD-10-CM | POA: Diagnosis not present

## 2023-08-02 DIAGNOSIS — I5032 Chronic diastolic (congestive) heart failure: Secondary | ICD-10-CM | POA: Diagnosis not present

## 2023-08-02 DIAGNOSIS — Z79899 Other long term (current) drug therapy: Secondary | ICD-10-CM | POA: Insufficient documentation

## 2023-08-02 DIAGNOSIS — G4733 Obstructive sleep apnea (adult) (pediatric): Secondary | ICD-10-CM | POA: Diagnosis not present

## 2023-08-02 DIAGNOSIS — I2510 Atherosclerotic heart disease of native coronary artery without angina pectoris: Secondary | ICD-10-CM | POA: Insufficient documentation

## 2023-08-02 DIAGNOSIS — E669 Obesity, unspecified: Secondary | ICD-10-CM | POA: Diagnosis not present

## 2023-08-02 DIAGNOSIS — Z6841 Body Mass Index (BMI) 40.0 and over, adult: Secondary | ICD-10-CM | POA: Diagnosis not present

## 2023-08-02 DIAGNOSIS — M7989 Other specified soft tissue disorders: Secondary | ICD-10-CM | POA: Insufficient documentation

## 2023-08-02 DIAGNOSIS — Z8673 Personal history of transient ischemic attack (TIA), and cerebral infarction without residual deficits: Secondary | ICD-10-CM | POA: Insufficient documentation

## 2023-08-02 LAB — BASIC METABOLIC PANEL WITH GFR
Anion gap: 10 (ref 5–15)
BUN: 14 mg/dL (ref 8–23)
CO2: 28 mmol/L (ref 22–32)
Calcium: 8.9 mg/dL (ref 8.9–10.3)
Chloride: 102 mmol/L (ref 98–111)
Creatinine, Ser: 1.25 mg/dL — ABNORMAL HIGH (ref 0.61–1.24)
GFR, Estimated: >60 mL/min (ref >60)
Glucose, Bld: 94 mg/dL (ref 70–99)
Potassium: 3.9 mmol/L (ref 3.5–5.1)
Sodium: 140 mmol/L (ref 135–145)

## 2023-08-02 LAB — BRAIN NATRIURETIC PEPTIDE: B Natriuretic Peptide: 136.1 pg/mL — ABNORMAL HIGH (ref 0.0–100.0)

## 2023-08-02 MED ORDER — METOLAZONE 2.5 MG PO TABS: 2.5000 mg | ORAL_TABLET | ORAL | 3 refills | Status: DC

## 2023-08-02 NOTE — Progress Notes (Signed)
 ADVANCED HF CLINIC CONSULT NOTE  Referring Physician: Dickey Fought, PA Primary Care: Edward Fought, PA Primary Cardiologist: Edward Bertrand, DO  Chief Complaint: HF  HPI: Edward Weaver is a 68 y.o. male with moderate coronary artery calcification (214, 66th percentile), chronic HFpEF, OSA not CPAP, Atrial Fibrillation, TIA, Hypertension, Hyperlipidemia, bilateral carotid artery stenosis, obesity due to excess calories.    Patient is accompanied by his wife Edward Weaver at today's office visit.  Patient is being followed by the practice for his underlying persistent atrial fibrillation, chronic HFpEF, and carotid disease.    Patient presents today to the office sooner than his regular scheduled visit for evaluation of lower extremity swelling.  Patient has noted that lower extremity swelling has been progressive.  He followed up with PCP and his Bumex  was increased from 0.5 mg p.o. daily to 1 mg p.o. daily.  He saw Dr Edward Weaver on Friday. He was fluid overloaded. Bumex  was increased to 1 mg twice a day.   Here today with his wife. SOB with exertion. Denies PND/Orthopnea. Appetite ok. He has been eating [place]. No fever or chills. He does not weigh at home. Taking all medications.   He has not been evaluated for sleep apnea for reasons unknown.  Reemphasized its importance.   In the past patient has been wary about being on Ozempic  or similar medications in the drug class but is now interested.    Past Medical History:  Diagnosis Date   Arthritis    Atrial fibrillation (HCC)    Bell's palsy    Carotid artery stenosis, asymptomatic, bilateral    Cerebrovascular disease    Chronic diastolic (congestive) heart failure (HCC)    Hiatal hernia    Hyperlipidemia    Hypertension    Neuropathy    OSA (obstructive sleep apnea)    Prediabetes    TIA (transient ischemic attack)    Nov and/or Dec 2014    Current Outpatient Medications  Medication Sig Dispense Refill   albuterol   (VENTOLIN  HFA) 108 (90 Base) MCG/ACT inhaler Inhale 2 puffs into the lungs every 6 (six) hours as needed for wheezing or shortness of breath. 1 each 0   atorvastatin  (LIPITOR) 40 MG tablet Take 1 tablet (40 mg total) by mouth at bedtime. 90 tablet 3   bumetanide  (BUMEX ) 1 MG tablet Take 1 tablet (1 mg total) by mouth 2 (two) times daily. 60 tablet 3   gabapentin  (NEURONTIN ) 300 MG capsule TAKE 1 CAPSULE (300 MG TOTAL) BY MOUTH 3 (THREE) TIMES DAILY. 21 capsule 0   metoprolol  succinate (TOPROL -XL) 50 MG 24 hr tablet Take 1 tablet (50 mg total) by mouth daily. Take with or immediately following a meal. 90 tablet 3   rivaroxaban  (XARELTO ) 20 MG TABS tablet Take 1 tablet (20 mg total) by mouth daily with supper. 90 tablet 3   sacubitril -valsartan  (ENTRESTO ) 24-26 MG Take 1 tablet by mouth 2 (two) times daily. 180 tablet 3   No current facility-administered medications for this encounter.    Allergies  Allergen Reactions   Penicillins Other (See Comments)    Syncope, hypotension      Social History   Socioeconomic History   Marital status: Married    Spouse name: Edward Weaver   Number of children: 3   Years of education: Not on file   Highest education level: Not on file  Occupational History   Not on file  Tobacco Use   Smoking status: Never   Smokeless tobacco: Never  Vaping  Use   Vaping status: Never Used  Substance and Sexual Activity   Alcohol  use: No   Drug use: No   Sexual activity: Yes    Birth control/protection: None  Other Topics Concern   Not on file  Social History Narrative   Not on file   Social Drivers of Health   Financial Resource Strain: Not on file  Food Insecurity: Not on file  Transportation Needs: Not on file  Physical Activity: Not on file  Stress: Not on file  Social Connections: Not on file  Intimate Partner Violence: Not At Risk (07/06/2023)   Received from [facility]   Humiliation, Afraid, Rape, and Kick questionnaire    Fear of Current or  Ex-Partner: No    Emotionally Abused: No    Physically Abused: No    Sexually Abused: No      Family History  Problem Relation Age of Onset   Hypertension Mother        Living   Cancer - Other Mother        Oral   Heart disease Mother    Hypertension Father    Alzheimer's disease Father        Living   Stroke Brother 69       Deceased   Healthy Brother        x1   Alzheimer's disease Paternal Aunt    Stroke Paternal Aunt        #1   Dementia Paternal Aunt        #2   Atrial fibrillation Son    Stroke Son    Hypertension Son     Vitals:   08/02/23 1419  BP: 110/71  Pulse: 63  SpO2: 92%  Weight: 133.4 kg (294 lb 3.2 oz)  Height: 5\' 6"  (1.676 m)    PHYSICAL EXAM: General:   No resp difficulty Neck: supple. JVP 10-11  Cor: PMI nondisplaced. Irregular rate & rhythm. No rubs, gallops or murmurs. Lungs: clear Abdomen: obese, soft, nontender, nondistended.  Extremities: no cyanosis, clubbing, rash, R and LLE 2+  edema Neuro: alert & oriented x3  ECG: A fib 54 bpm    ASSESSMENT & PLAN: 1. Chronic HFpEF Echo 2023 EF 50-55% Grade I DD.  NYHA III. Volume overloaded. Suspect in the setting of high sodium diet.  Continue bumex  1 mg twice a day. Add Metolazone  2.5 mg Monday and Friday.  Check BMET and BNP Discussed low salt food choices and limiting fluid intake to < 2 liters per day.   2.  Persistent A Fib  Followed by EP. Needs optimize to be a candidate for cardioversion.  Rate controlled. Continue Toprol  XL 50 mg daily.  Continue Xarelto  daily   3. Obesity  Body mass index is 47.49 kg/m. Referred to pharmacy for GLP1  Pharmacy team working on patient assistance for Toprol  Xl, Entresto , and Xarelto .   Follow up in 2-3 weeks to reassess volume status.  Edward Bars, NP  2:35 PM

## 2023-08-02 NOTE — Telephone Encounter (Signed)
 Advanced Heart Failure Patient Advocate Encounter  The patient was approved for a [employer] grant that will help cover the cost of Entresto , Metoprolol . Total amount awarded, $4,500. Eligibility, 07/03/23 - 07/01/24.  ID [id number]  BIN [id number]  PCN [id number]  Group [id number]  Patient was provided a copy while in office.  Of note, Xarelto  is showing a $47, 30 day co-pay. Patient would need to spend at least 4% OOP before applying for this assistance.  Based on the income provided, will send social work a message regarding LIS application.  Correne Dillon, CPhT

## 2023-08-02 NOTE — Progress Notes (Signed)
 H&V Care Navigation CSW Progress Note  Clinical Social Worker met with patient to assess for needs.  Patient is participating in a Managed Medicaid Plan:  No  Patient is a 68 yo male who reports he receives [employer] and has [employer]. He denies any concerns with housing, transportation and his wife receives food stamps. He states some financial challenges with co pays for his medications and has applied through our Pharmacy advocate for a grant to off set the costs. Patient states he thinks he should be able to afford the additional co pays. CSW discussed the LIS and provided information to contact to discuss further. Patient grateful for the support and denies any other needs at this time. Aubry Blase, LCSW, CCSW-MCS [phone]   SDOH Screenings   Food Insecurity: Food Insecurity Present (08/02/2023)  Housing: Unknown (08/02/2023)  Transportation Needs: No Transportation Needs (08/02/2023)  Utilities: Not At Risk (08/02/2023)  Depression (PHQ2-9): Low Risk  (05/06/2022)  Financial Resource Strain: Medium Risk (08/02/2023)  Tobacco Use: Low Risk  (08/02/2023)  Health Literacy: Adequate Health Literacy (08/02/2023)

## 2023-08-02 NOTE — Patient Instructions (Addendum)
 EKG done today.   Labs done today. We will contact you only if your labs are abnormal.  START Metolazone  2.5mg  (1 tablet) by mouth every Monday and Friday.   No other medication changes were made. Please continue all current medications as prescribed.  Your physician recommends that you schedule a follow-up appointment in: 2 weeks  If you have any questions or concerns before your next appointment please send us  a message through [place] or call our office at [phone].    TO LEAVE A MESSAGE FOR THE NURSE SELECT OPTION 2, PLEASE LEAVE A MESSAGE INCLUDING: YOUR NAME DATE OF BIRTH CALL BACK NUMBER REASON FOR CALL**this is important as we prioritize the call backs  YOU WILL RECEIVE A CALL BACK THE SAME DAY AS LONG AS YOU CALL BEFORE 4:00 PM   Do the following things EVERYDAY: Weigh yourself in the morning before breakfast. Write it down and keep it in a log. Take your medicines as prescribed Eat low salt foods--Limit salt (sodium) to 2000 mg per day.  Stay as active as you can everyday Limit all fluids for the day to less than 2 liters   At the Advanced Heart Failure Clinic, you and your health needs are our priority. As part of our continuing mission to provide you with exceptional heart care, we have created designated Provider Care Teams. These Care Teams include your primary Cardiologist (physician) and Advanced Practice Providers (APPs- Physician Assistants and Nurse Practitioners) who all work together to provide you with the care you need, when you need it.   You may see any of the following providers on your designated Care Team at your next follow up: Dr Jules Oar Dr Peder Bourdon Dr. Mimi Alt, NP Ruddy Corral, [place] [facility] [place], [place] Dennise Fitz, NP Luster Salters, PharmD   Please be sure to bring in all your medications bottles to every appointment.    Thank you for choosing [facility]-Advanced [facility] Failure  Clinic

## 2023-08-03 ENCOUNTER — Telehealth: Payer: Self-pay | Admitting: Home Health

## 2023-08-03 ENCOUNTER — Emergency Department (HOSPITAL_COMMUNITY)

## 2023-08-03 ENCOUNTER — Emergency Department (HOSPITAL_COMMUNITY)
Admission: EM | Admit: 2023-08-03 | Discharge: 2023-08-04 | Disposition: A | Discharge status: Home / Self Care | Attending: Emergency Medicine | Admitting: Emergency Medicine

## 2023-08-03 ENCOUNTER — Telehealth (HOSPITAL_COMMUNITY): Payer: Self-pay | Admitting: Licensed Clinical Social Worker

## 2023-08-03 ENCOUNTER — Other Ambulatory Visit: Payer: Self-pay

## 2023-08-03 ENCOUNTER — Encounter: Payer: Self-pay | Admitting: Cardiology

## 2023-08-03 ENCOUNTER — Encounter (HOSPITAL_COMMUNITY): Payer: Self-pay | Admitting: Emergency Medicine

## 2023-08-03 ENCOUNTER — Telehealth (HOSPITAL_COMMUNITY): Payer: Self-pay | Admitting: Cardiology

## 2023-08-03 DIAGNOSIS — I110 Hypertensive heart disease with heart failure: Secondary | ICD-10-CM | POA: Diagnosis not present

## 2023-08-03 DIAGNOSIS — R0602 Shortness of breath: Secondary | ICD-10-CM | POA: Diagnosis not present

## 2023-08-03 DIAGNOSIS — R600 Localized edema: Secondary | ICD-10-CM | POA: Diagnosis present

## 2023-08-03 DIAGNOSIS — R609 Edema, unspecified: Secondary | ICD-10-CM | POA: Diagnosis not present

## 2023-08-03 DIAGNOSIS — E875 Hyperkalemia: Secondary | ICD-10-CM

## 2023-08-03 DIAGNOSIS — Z79899 Other long term (current) drug therapy: Secondary | ICD-10-CM | POA: Diagnosis not present

## 2023-08-03 DIAGNOSIS — I499 Cardiac arrhythmia, unspecified: Secondary | ICD-10-CM | POA: Diagnosis not present

## 2023-08-03 DIAGNOSIS — I509 Heart failure, unspecified: Secondary | ICD-10-CM | POA: Diagnosis not present

## 2023-08-03 DIAGNOSIS — I5032 Chronic diastolic (congestive) heart failure: Secondary | ICD-10-CM | POA: Diagnosis not present

## 2023-08-03 LAB — URINALYSIS, ROUTINE W REFLEX MICROSCOPIC
Bacteria, UA: NONE SEEN
Bilirubin Urine: NEGATIVE
Glucose, UA: NEGATIVE mg/dL
Hgb urine dipstick: NEGATIVE
Ketones, ur: NEGATIVE mg/dL
Leukocytes,Ua: NEGATIVE
Nitrite: NEGATIVE
Protein, ur: NEGATIVE mg/dL
Specific Gravity, Urine: 1.013 (ref 1.005–1.030)
pH: 5 (ref 5.0–8.0)

## 2023-08-03 LAB — CBC WITH DIFFERENTIAL/PLATELET
Abs Immature Granulocytes: 0.01 10*3/uL (ref 0.00–0.07)
Basophils Absolute: 0.1 10*3/uL (ref 0.0–0.1)
Basophils Relative: 1 %
Eosinophils Absolute: 0.4 10*3/uL (ref 0.0–0.5)
Eosinophils Relative: 6 %
HCT: 43.3 % (ref 39.0–52.0)
Hemoglobin: 14.5 g/dL (ref 13.0–17.0)
Immature Granulocytes: 0 %
Lymphocytes Relative: 30 %
Lymphs Abs: 2 10*3/uL (ref 0.7–4.0)
MCH: 29.6 pg (ref 26.0–34.0)
MCHC: 33.5 g/dL (ref 30.0–36.0)
MCV: 88.4 fL (ref 80.0–100.0)
Monocytes Absolute: 0.5 10*3/uL (ref 0.1–1.0)
Monocytes Relative: 8 %
Neutro Abs: 3.7 10*3/uL (ref 1.7–7.7)
Neutrophils Relative %: 55 %
Platelets: 269 10*3/uL (ref 150–400)
RBC: 4.9 MIL/uL (ref 4.22–5.81)
RDW: 12.6 % (ref 11.5–15.5)
WBC: 6.8 10*3/uL (ref 4.0–10.5)
nRBC: 0 % (ref 0.0–0.2)

## 2023-08-03 LAB — COMPREHENSIVE METABOLIC PANEL WITH GFR
ALT: 31 U/L (ref 0–44)
AST: 23 U/L (ref 15–41)
Albumin: 3.6 g/dL (ref 3.5–5.0)
Alkaline Phosphatase: 63 U/L (ref 38–126)
Anion gap: 15 (ref 5–15)
BUN: 20 mg/dL (ref 8–23)
CO2: 28 mmol/L (ref 22–32)
Calcium: 9.5 mg/dL (ref 8.9–10.3)
Chloride: 95 mmol/L — ABNORMAL LOW (ref 98–111)
Creatinine, Ser: 1.4 mg/dL — ABNORMAL HIGH (ref 0.61–1.24)
GFR, Estimated: 55 mL/min — ABNORMAL LOW (ref >60)
Glucose, Bld: 109 mg/dL — ABNORMAL HIGH (ref 70–99)
Potassium: 3.7 mmol/L (ref 3.5–5.1)
Sodium: 138 mmol/L (ref 135–145)
Total Bilirubin: 0.9 mg/dL (ref 0.0–1.2)
Total Protein: 6.7 g/dL (ref 6.5–8.1)

## 2023-08-03 LAB — BRAIN NATRIURETIC PEPTIDE: B Natriuretic Peptide: 53.1 pg/mL (ref 0.0–100.0)

## 2023-08-03 LAB — TROPONIN I (HIGH SENSITIVITY)
Troponin I (High Sensitivity): 7 ng/L (ref <18)
Troponin I (High Sensitivity): 7 ng/L (ref <18)

## 2023-08-03 NOTE — Telephone Encounter (Signed)
 Patients daughter called again about patient being direct admitted into hospital. Reiterated what Chantel previously told her.

## 2023-08-03 NOTE — Telephone Encounter (Signed)
 Pts daughter called with concerns of fluid overload Reports pt is requesting direct admit that was offered at [facility] with Dr Marven Slimmer 5/16  Reports they expected more UOP with medication adjustments and pt has only went to the restroom 5-6 times   Please advise

## 2023-08-03 NOTE — Telephone Encounter (Signed)
 CSW contacted patient to discuss possibility of Forensic scientist program after consulting with [employer]. Patient states he is on his way to the ED as he thinks he needs to be admitted. CSW will refer to CP program at a later time based on results of ED visit. Aubry Blase, LCSW, CCSW-MCS [phone]

## 2023-08-03 NOTE — Discharge Instructions (Signed)
 Continue your current medications.  Make an appointment to follow-up with cardiology or keep the appointment set up and arrange.  And also need to follow-up with the electrophysiologist cardiology about the heart rhythm.  Return for any new or worse symptoms.  Cardiology reviewed all your labs from tonight said things look excellent right now no indication for admission.  Which is good.

## 2023-08-03 NOTE — Telephone Encounter (Addendum)
 Pt called for lab results Advised will  only contact pt for abnormal results or if changes are needed, otherwise no news is good news.  Overall labs are stable however will need sign off by provider to discuss further  Pt questioned if medication adjustments made at [facility] 5/19 dont work, would he be allowed to report to ER to get fluid off faster -will forward to provider    Pt also questioned if he could get scale -message to LCSW

## 2023-08-03 NOTE — Telephone Encounter (Signed)
 Attempted phone call to pt.  Call went to voicemail and then disconnected before being able to leave voicemail message.  See also MyChart message.

## 2023-08-03 NOTE — Telephone Encounter (Signed)
 Pt's daughter-in-law is calling to check the status of this message.

## 2023-08-03 NOTE — Telephone Encounter (Signed)
Please see telephone encounter 5/20

## 2023-08-03 NOTE — ED Triage Notes (Signed)
 Pt BIBA from home w/ c/o abd/leg swelling. Pt had a scheduled admission to the hospital for this weekend but stated that he has something to do this weekend so called 911 to get seen now. Pt has CHF and has been taking all of his medications but continues to have increasing of swelling in legs & abd. Pt doctor stated that his CR was elevated which is the need for admission. Has only peed x2 today. VS stable and A&O x4

## 2023-08-03 NOTE — Telephone Encounter (Signed)
 Patient's daughter in law called, states patient had taken additional diuretic, still fluid overloaded, now in touch with EMS, on way to ER, wants to know if this can be speed up. Advised that patient will be seen in the ER and out team will see accordingly, I am not able to speed up this process and unable arrange direct admission at this hour.

## 2023-08-03 NOTE — Telephone Encounter (Signed)
Pt en route to ER.

## 2023-08-03 NOTE — ED Provider Notes (Addendum)
 [place] EMERGENCY DEPARTMENT AT [facility] Provider Note   CSN: [id number] Arrival date & time: 08/03/23  1829     History  Chief Complaint  Patient presents with   Leg Swelling    CHF    KYVON HU is a 68 y.o. male.  Patient seen by Dr. Marven Slimmer cardiology on May 16.  Patient at that time was in fairly significant CHF.  Has a history of atrial fibrillation as well and is on Xarelto  for that.  He was sent in for chronic diastolic heart failure atrial fibrillation TIA hypertension hyperlipidemia coronary rotted artery stenosis and obesity.  When they saw him that day they wanted to have him admitted but patient refused he wanted to try some things at home.  He is been contacting them since feeling that he needs to be admitted.  Here today oxygen saturations on room air 96% which is good pulse of 64 temp 97.9 respirations 19 blood pressure 148/81.  He denies any chest pain states that his leg swelling has improved some.  Patient's primary care doctor is [facility] physicians Dr. Dann Dust       Home Medications Prior to Admission medications   Medication Sig Start Date End Date Taking? Authorizing Provider  albuterol  (VENTOLIN  HFA) 108 (90 Base) MCG/ACT inhaler Inhale 2 puffs into the lungs every 6 (six) hours as needed for wheezing or shortness of breath. 06/01/22   Gorge Haines, MD  atorvastatin  (LIPITOR) 40 MG tablet Take 1 tablet (40 mg total) by mouth at bedtime. 01/25/23 08/02/23  Tolia, Sunit, DO  bumetanide  (BUMEX ) 1 MG tablet Take 1 tablet (1 mg total) by mouth 2 (two) times daily. 06/07/23   Tolia, Sunit, DO  gabapentin  (NEURONTIN ) 300 MG capsule TAKE 1 CAPSULE (300 MG TOTAL) BY MOUTH 3 (THREE) TIMES DAILY. 01/26/23   Madsen Haines, MD  metolazone  (ZAROXOLYN ) 2.5 MG tablet Take 1 tablet (2.5 mg total) by mouth 2 (two) times a week. On Monday and friday 08/02/23 10/31/23  Bensimhon, Rheta Celestine, MD  metoprolol  succinate (TOPROL -XL) 50 MG 24 hr tablet Take 1 tablet (50 mg  total) by mouth daily. Take with or immediately following a meal. 07/30/23 10/28/23  Boyce Byes, MD  rivaroxaban  (XARELTO ) 20 MG TABS tablet Take 1 tablet (20 mg total) by mouth daily with supper. 03/09/23   Tolia, Sunit, DO  sacubitril -valsartan  (ENTRESTO ) 24-26 MG Take 1 tablet by mouth 2 (two) times daily. 07/30/23   Boyce Byes, MD      Allergies    Penicillins    Review of Systems   Review of Systems  Constitutional:  Negative for chills and fever.  HENT:  Negative for ear pain and sore throat.   Eyes:  Negative for pain and visual disturbance.  Respiratory:  Positive for shortness of breath. Negative for cough.   Cardiovascular:  Positive for leg swelling. Negative for chest pain and palpitations.  Gastrointestinal:  Negative for abdominal pain and vomiting.  Genitourinary:  Negative for dysuria and hematuria.  Musculoskeletal:  Negative for arthralgias and back pain.  Skin:  Negative for color change and rash.  Neurological:  Negative for seizures and syncope.  All other systems reviewed and are negative.   Physical Exam Updated Vital Signs BP 121/61   Pulse 68   Temp 97.8 F (36.6 C) (Oral)   Resp 18   SpO2 95%  Physical Exam Vitals and nursing note reviewed.  Constitutional:      General: He is  not in acute distress.    Appearance: Normal appearance. He is well-developed.  HENT:     Head: Normocephalic and atraumatic.  Eyes:     Conjunctiva/sclera: Conjunctivae normal.     Pupils: Pupils are equal, round, and reactive to light.  Cardiovascular:     Rate and Rhythm: Normal rate. Rhythm irregular.     Heart sounds: No murmur heard. Pulmonary:     Effort: Pulmonary effort is normal. No respiratory distress.     Breath sounds: Rales present. No wheezing or rhonchi.  Abdominal:     Palpations: Abdomen is soft.     Tenderness: There is no abdominal tenderness.  Musculoskeletal:        General: No swelling.     Cervical back: Normal range of motion  and neck supple.     Right lower leg: Edema present.     Left lower leg: Edema present.  Skin:    General: Skin is warm and dry.     Capillary Refill: Capillary refill takes less than 2 seconds.  Neurological:     General: No focal deficit present.     Mental Status: He is alert and oriented to person, place, and time.  Psychiatric:        Mood and Affect: Mood normal.     ED Results / Procedures / Treatments   Labs (all labs ordered are listed, but only abnormal results are displayed) Labs Reviewed  COMPREHENSIVE METABOLIC PANEL WITH GFR - Abnormal; Notable for the following components:      Result Value   Chloride 95 (*)    Glucose, Bld 109 (*)    Creatinine, Ser 1.40 (*)    GFR, Estimated 55 (*)    All other components within normal limits  CBC WITH DIFFERENTIAL/PLATELET  BRAIN NATRIURETIC PEPTIDE  URINALYSIS, ROUTINE W REFLEX MICROSCOPIC  TROPONIN I (HIGH SENSITIVITY)  TROPONIN I (HIGH SENSITIVITY)    EKG None  Radiology DG Chest Port 1 View Result Date: 08/03/2023 CLINICAL DATA:  CHF EXAM: PORTABLE CHEST 1 VIEW COMPARISON:  Chest x-ray 12/26/2021 FINDINGS: The heart size and mediastinal contours are within normal limits. Both lungs are clear. The visualized skeletal structures are unremarkable. IMPRESSION: No active disease. Electronically Signed   By: Tyron Gallon M.D.   On: 08/03/2023 20:59    Procedures Procedures    Medications Ordered in ED Medications - No data to display  ED Course/ Medical Decision Making/ A&P                                 Medical Decision Making Amount and/or Complexity of Data Reviewed Labs: ordered. Radiology: ordered.   Patient nontoxic no acute distress.  Most likely will require admission for the congestive heart failure as per the plan on May 16.  Will get chest x-ray will get the labs.  Patient is on Bumex  1 mg 2 times daily.  CBC white count 6.8 hemoglobin 14.5 platelets 269.  Complete metabolic panel creatinine  1.4 for GFR 55 not too far off from baseline.  BNP 53 very reassuring patient's initial troponin was 7.  And delta troponins pending.  Urinalysis negative chest x-ray no active disease.  Since he was referred in for admission by Dr. Marven Slimmer on the 16th we will discuss with the cardiology fellow.  I discussed with cardiology fellow he did bring up that Dr. Julane Ny saw him yesterday in the office.  Cardiology fellow feels  that patient is clinically good for discharge home has no indications for admission at this time.  Final Clinical Impression(s) / ED Diagnoses Final diagnoses:  Chronic diastolic heart failure [facility])    Rx / DC Orders ED Discharge Orders     None         Nicklas Barns, MD 08/03/23 2006    Nicklas Barns, MD 08/03/23 2259    Nicklas Barns, MD 08/03/23 [phone]

## 2023-08-04 ENCOUNTER — Ambulatory Visit (HOSPITAL_COMMUNITY): Payer: Self-pay | Admitting: Adult Health

## 2023-08-11 ENCOUNTER — Telehealth (HOSPITAL_COMMUNITY): Payer: Self-pay | Admitting: Licensed Clinical Social Worker

## 2023-08-11 NOTE — Telephone Encounter (Signed)
 CSW contacted patient to discuss interest in the [employer]. Message left for return call. Aubry Blase, LCSW, CCSW-MCS [phone]

## 2023-08-14 DIAGNOSIS — I2584 Coronary atherosclerosis due to calcified coronary lesion: Secondary | ICD-10-CM | POA: Diagnosis not present

## 2023-08-14 DIAGNOSIS — I10 Essential (primary) hypertension: Secondary | ICD-10-CM | POA: Diagnosis not present

## 2023-08-14 DIAGNOSIS — I5032 Chronic diastolic (congestive) heart failure: Secondary | ICD-10-CM | POA: Diagnosis not present

## 2023-08-14 DIAGNOSIS — I2510 Atherosclerotic heart disease of native coronary artery without angina pectoris: Secondary | ICD-10-CM | POA: Diagnosis not present

## 2023-08-14 DIAGNOSIS — I4819 Other persistent atrial fibrillation: Secondary | ICD-10-CM | POA: Diagnosis not present

## 2023-08-14 DIAGNOSIS — E782 Mixed hyperlipidemia: Secondary | ICD-10-CM | POA: Diagnosis not present

## 2023-08-16 NOTE — Progress Notes (Signed)
 ADVANCED HF CLINIC NOTE  Referring Physician: Dickey Fought, PA Primary Care: Dickey Fought, PA Primary Cardiologist: Olinda Bertrand, DO HF Cardiologist: Jules Oar, MD  Chief Complaint: HF  HPI: Edward Weaver is a 68 y.o. male with moderate coronary artery calcification (214, 66th percentile), chronic HFpEF, OSA not CPAP, Atrial Fibrillation, TIA, Hypertension, Hyperlipidemia, bilateral carotid artery stenosis, obesity due to excess calories.    Patient is being followed by the practice for his underlying persistent atrial fibrillation, chronic HFpEF, and carotid disease. Patient noted that lower extremity swelling has been progressive.  He followed up with PCP and his Bumex  was increased from 0.5 mg p.o. daily to 1 mg p.o. daily.  He saw Dr Marven Slimmer 07/30/23. He was fluid overloaded. Bumex  was increased to 1 mg twice a day.   Today he returns for AHF follow up with his wife. Overall feeling ok just fatigued. Denies palpitations, CP, dizziness, or PND/Orthopnea. Patient with persistent BLE edema, has not been wearing compression socks as they are hard to get on. SOB with activity. Appetite ok, occasionally eats out. No fever or chills. Does not weight at home. Tried to take all medications but occasionally misses meds in the morning and has not been fully compliant with Bumex  BID. Denies ETOH, tobacco or drug use. Drinks ~32 oz a day.   Past Medical History:  Diagnosis Date   Arthritis    Atrial fibrillation (HCC)    Bell's palsy    Carotid artery stenosis, asymptomatic, bilateral    Cerebrovascular disease    Chronic diastolic (congestive) heart failure (HCC)    Hiatal hernia    Hyperlipidemia    Hypertension    Neuropathy    OSA (obstructive sleep apnea)    Prediabetes    TIA (transient ischemic attack)    Nov and/or Dec 2014    Current Outpatient Medications  Medication Sig Dispense Refill   albuterol  (VENTOLIN  HFA) 108 (90 Base) MCG/ACT inhaler Inhale 2 puffs into  the lungs every 6 (six) hours as needed for wheezing or shortness of breath. 1 each 0   atorvastatin  (LIPITOR) 40 MG tablet Take 1 tablet (40 mg total) by mouth at bedtime. 90 tablet 3   bumetanide  (BUMEX ) 1 MG tablet Take 1 tablet (1 mg total) by mouth 2 (two) times daily. 60 tablet 3   gabapentin  (NEURONTIN ) 300 MG capsule TAKE 1 CAPSULE (300 MG TOTAL) BY MOUTH 3 (THREE) TIMES DAILY. (Patient taking differently: Take 600 mg by mouth at bedtime.) 21 capsule 0   metoprolol  succinate (TOPROL -XL) 50 MG 24 hr tablet Take 1 tablet (50 mg total) by mouth daily. Take with or immediately following a meal. 90 tablet 3   rivaroxaban  (XARELTO ) 20 MG TABS tablet Take 1 tablet (20 mg total) by mouth daily with supper. 90 tablet 3   sacubitril -valsartan  (ENTRESTO ) 24-26 MG Take 1 tablet by mouth 2 (two) times daily. 180 tablet 3   metolazone  (ZAROXOLYN ) 2.5 MG tablet Take 1 tablet (2.5 mg total) by mouth 3 (three) times a week. On Monday Wednesday friday 24 tablet 3   potassium chloride  SA (KLOR-CON  M) 20 MEQ tablet Take 2 tablets (40 mEq total) by mouth 3 (three) times a week. 90 tablet 3   No current facility-administered medications for this encounter.   Allergies  Allergen Reactions   Penicillins Other (See Comments)    Syncope, hypotension     Social History   Socioeconomic History   Marital status: Married    Spouse name: [name]  Number of children: 3   Years of education: Not on file   Highest education level: Not on file  Occupational History   Not on file  Tobacco Use   Smoking status: Never   Smokeless tobacco: Never  Vaping Use   Vaping status: Never Used  Substance and Sexual Activity   Alcohol  use: No   Drug use: No   Sexual activity: Yes    Birth control/protection: None  Other Topics Concern   Not on file  Social History Narrative   Not on file   Social Drivers of Health   Financial Resource Strain: Medium Risk (08/02/2023)   Overall Financial Resource Strain (CARDIA)     Difficulty of Paying Living Expenses: Somewhat hard  Food Insecurity: Food Insecurity Present (08/02/2023)   Hunger Vital Sign    Worried About Running Out of Food in the Last Year: Sometimes true    Ran Out of Food in the Last Year: Sometimes true  Transportation Needs: No Transportation Needs (08/02/2023)   PRAPARE - Administrator, Civil Service (Medical): No    Lack of Transportation (Non-Medical): No  Physical Activity: Not on file  Stress: Not on file  Social Connections: Not on file  Intimate Partner Violence: Not At Risk (07/06/2023)   Received from [facility]   Humiliation, Afraid, Rape, and Kick questionnaire    Fear of Current or Ex-Partner: No    Emotionally Abused: No    Physically Abused: No    Sexually Abused: No    Family History  Problem Relation Age of Onset   Hypertension Mother        Living   Cancer - Other Mother        Oral   Heart disease Mother    Hypertension Father    Alzheimer's disease Father        Living   Stroke Brother 59       Deceased   Healthy Brother        x1   Alzheimer's disease Paternal Aunt    Stroke Paternal Aunt        #1   Dementia Paternal Aunt        #2   Atrial fibrillation Son    Stroke Son    Hypertension Son    Vitals:   08/18/23 1326  BP: 110/66  Pulse: (!) 57  SpO2: 97%  Weight: 135.1 kg (297 lb 12.8 oz)   PHYSICAL EXAM: General:  well appearing.  No respiratory difficulty. Walked into clinic.  Neck: supple. JVD ~10 cm.  Cor: PMI nondisplaced. Regular rate & rhythm. No rubs, gallops or murmurs. Lungs: clear, diminished bases Extremities: no cyanosis, clubbing, rash, +2 BLE edema  Neuro: alert & oriented x 3. Moves all 4 extremities w/o difficulty. Affect pleasant.   ECG: A fib 72 bpm (Personally reviewed from 08/03/23)    ASSESSMENT & PLAN: 1. Chronic HFpEF Echo 2023 EF 50-55% Grade I DD.  Cardiac PET 5/24: LV EF 47%, stress EF 42%. Moderate coronary calcifications were present.  Coronary calcifications were present in the LAD, LCx and RCA. Findings are consistent with no ischemia and no infarction. The study is low risk.  NYHA III. Volume overloaded. Suspect in the setting of high sodium diet and diuretic noncompliance.  Encouraged compliance with bumex  1 mg twice a day. Continue Metolazone  2.5 mg Monday and Friday. Will have him take the next 2 Wednesdays as well. Start KDUR 40 mEq on metolazone  days.  Continue  Entresto  24-26 mg BID Check BMET and BNP Discussed low salt food choices and limiting fluid intake to < 2 liters per day.  Updated echo pending  2.  Persistent A Fib  Followed by EP. Needs optimize to be a candidate for cardioversion.  Rate controlled. Continue Toprol  XL 50 mg daily.  Continue Xarelto  daily. Denies abnormal bleeding.   3. Obesity  Body mass index is 48.07 kg/m. Has been referred to pharmacy for GLP1   [place] Need to confirm with Fredrik Jensen.   Pharmacy team working on patient assistance for Toprol  Xl, Entresto , and Xarelto . Norberta Beans approved.   Follow up in 2-3 weeks to reassess volume status.   Sheryl Donna, NP  3:41 PM

## 2023-08-17 ENCOUNTER — Telehealth (HOSPITAL_COMMUNITY): Payer: Self-pay

## 2023-08-17 DIAGNOSIS — E782 Mixed hyperlipidemia: Secondary | ICD-10-CM | POA: Diagnosis not present

## 2023-08-17 NOTE — Telephone Encounter (Signed)
 Called to confirm/remind patient of their appointment at the [facility] on 08/18/2023 1:30.   Appointment:   [] Confirmed  [x] Left mess   [] No answer/No voice mail  [] VM Full/unable to leave message  [] Phone not in service  Patient reminded to bring all medications and/or complete list.  Confirmed patient has transportation. Gave directions, instructed to utilize valet parking.

## 2023-08-18 ENCOUNTER — Ambulatory Visit (HOSPITAL_COMMUNITY): Payer: Self-pay | Admitting: Internal Medicine

## 2023-08-18 ENCOUNTER — Ambulatory Visit (HOSPITAL_COMMUNITY)
Admission: RE | Admit: 2023-08-18 | Discharge: 2023-08-18 | Disposition: A | Discharge status: Home / Self Care | Source: Ambulatory Visit | Attending: Internal Medicine | Admitting: Internal Medicine

## 2023-08-18 ENCOUNTER — Encounter (HOSPITAL_COMMUNITY): Payer: Self-pay

## 2023-08-18 VITALS — BP 110/66 | HR 57 | Wt 297.8 lb

## 2023-08-18 DIAGNOSIS — I2510 Atherosclerotic heart disease of native coronary artery without angina pectoris: Secondary | ICD-10-CM | POA: Diagnosis not present

## 2023-08-18 DIAGNOSIS — I4819 Other persistent atrial fibrillation: Secondary | ICD-10-CM | POA: Diagnosis not present

## 2023-08-18 DIAGNOSIS — M7989 Other specified soft tissue disorders: Secondary | ICD-10-CM | POA: Insufficient documentation

## 2023-08-18 DIAGNOSIS — E785 Hyperlipidemia, unspecified: Secondary | ICD-10-CM | POA: Diagnosis not present

## 2023-08-18 DIAGNOSIS — Z5986 Financial insecurity: Secondary | ICD-10-CM | POA: Diagnosis not present

## 2023-08-18 DIAGNOSIS — Z7901 Long term (current) use of anticoagulants: Secondary | ICD-10-CM | POA: Diagnosis not present

## 2023-08-18 DIAGNOSIS — I5032 Chronic diastolic (congestive) heart failure: Secondary | ICD-10-CM

## 2023-08-18 DIAGNOSIS — E66813 Obesity, class 3: Secondary | ICD-10-CM | POA: Diagnosis not present

## 2023-08-18 DIAGNOSIS — I110 Hypertensive heart disease with heart failure: Secondary | ICD-10-CM | POA: Diagnosis not present

## 2023-08-18 DIAGNOSIS — Z8673 Personal history of transient ischemic attack (TIA), and cerebral infarction without residual deficits: Secondary | ICD-10-CM | POA: Insufficient documentation

## 2023-08-18 DIAGNOSIS — E669 Obesity, unspecified: Secondary | ICD-10-CM | POA: Diagnosis not present

## 2023-08-18 DIAGNOSIS — I6523 Occlusion and stenosis of bilateral carotid arteries: Secondary | ICD-10-CM | POA: Diagnosis not present

## 2023-08-18 DIAGNOSIS — Z6841 Body Mass Index (BMI) 40.0 and over, adult: Secondary | ICD-10-CM

## 2023-08-18 DIAGNOSIS — G4733 Obstructive sleep apnea (adult) (pediatric): Secondary | ICD-10-CM | POA: Diagnosis not present

## 2023-08-18 DIAGNOSIS — Z79899 Other long term (current) drug therapy: Secondary | ICD-10-CM | POA: Diagnosis not present

## 2023-08-18 DIAGNOSIS — R5383 Other fatigue: Secondary | ICD-10-CM | POA: Insufficient documentation

## 2023-08-18 LAB — CBC
HCT: 43.2 % (ref 39.0–52.0)
Hemoglobin: 14.3 g/dL (ref 13.0–17.0)
MCH: 29.7 pg (ref 26.0–34.0)
MCHC: 33.1 g/dL (ref 30.0–36.0)
MCV: 89.8 fL (ref 80.0–100.0)
Platelets: 280 10*3/uL (ref 150–400)
RBC: 4.81 MIL/uL (ref 4.22–5.81)
RDW: 12.9 % (ref 11.5–15.5)
WBC: 5.9 10*3/uL (ref 4.0–10.5)
nRBC: 0 % (ref 0.0–0.2)

## 2023-08-18 LAB — BASIC METABOLIC PANEL WITH GFR
Anion gap: 10 (ref 5–15)
BUN: 14 mg/dL (ref 8–23)
CO2: 29 mmol/L (ref 22–32)
Calcium: 9 mg/dL (ref 8.9–10.3)
Chloride: 100 mmol/L (ref 98–111)
Creatinine, Ser: 1.19 mg/dL (ref 0.61–1.24)
GFR, Estimated: >60 mL/min (ref >60)
Glucose, Bld: 106 mg/dL — ABNORMAL HIGH (ref 70–99)
Potassium: 4.2 mmol/L (ref 3.5–5.1)
Sodium: 139 mmol/L (ref 135–145)

## 2023-08-18 LAB — LIPID PANEL
Cholesterol: 156 mg/dL (ref 0–200)
HDL: 42 mg/dL (ref >40)
LDL Cholesterol: 65 mg/dL (ref 0–99)
Total CHOL/HDL Ratio: 3.7 ratio
Triglycerides: 247 mg/dL — ABNORMAL HIGH (ref <150)
VLDL: 49 mg/dL — ABNORMAL HIGH (ref 0–40)

## 2023-08-18 LAB — IRON AND TIBC
Iron: 88 ug/dL (ref 45–182)
Saturation Ratios: 23 % (ref 17.9–39.5)
TIBC: 385 ug/dL (ref 250–450)
UIBC: 297 ug/dL

## 2023-08-18 LAB — FERRITIN: Ferritin: 398 ng/mL — ABNORMAL HIGH (ref 24–336)

## 2023-08-18 LAB — BRAIN NATRIURETIC PEPTIDE: B Natriuretic Peptide: 131.9 pg/mL — ABNORMAL HIGH (ref 0.0–100.0)

## 2023-08-18 MED ORDER — METOLAZONE 2.5 MG PO TABS: 2.5000 mg | ORAL_TABLET | ORAL | 3 refills | Status: DC

## 2023-08-18 MED ORDER — METOLAZONE 2.5 MG PO TABS: 2.5000 mg | ORAL_TABLET | ORAL | 3 refills | Status: AC

## 2023-08-18 MED ORDER — POTASSIUM CHLORIDE CRYS ER 20 MEQ PO TBCR: 40.0000 meq | EXTENDED_RELEASE_TABLET | ORAL | 3 refills | Status: DC

## 2023-08-18 MED ORDER — POTASSIUM CHLORIDE CRYS ER 20 MEQ PO TBCR: 20.0000 meq | EXTENDED_RELEASE_TABLET | Freq: Every day | ORAL | 3 refills | Status: DC

## 2023-08-18 NOTE — Addendum Note (Signed)
 Encounter addended by: Dewey Fordyce, CMA on: 08/18/2023 4:20 PM  Actions taken: Visit diagnoses modified, Order list changed, Diagnosis association updated, Flowsheet accepted, Clinical Note Signed, Charge Capture section accepted

## 2023-08-18 NOTE — Progress Notes (Signed)
 ReDS Vest / Clip - 08/18/23 1600       ReDS Vest / Clip   Station Marker D    Ruler Value 39.5    ReDS Value Range High volume overload    ReDS Actual Value 44

## 2023-08-18 NOTE — Patient Instructions (Addendum)
 Good to see you today!  START potassium 40 meq (2 tablets) 3 x a week   Increase Metolazone  2.5 mg 3 x a week  Your physician has requested that you have an echocardiogram. Echocardiography is a painless test that uses sound waves to create images of your heart. It provides your doctor with information about the size and shape of your heart and how well your heart's chambers and valves are working. This procedure takes approximately one hour. There are no restrictions for this procedure. Please do NOT wear cologne, perfume, aftershave, or lotions (deodorant is allowed). Please arrive 15 minutes prior to your appointment time.  Please note: We ask at that you not bring children with you during ultrasound (echo/ vascular) testing. Due to room size and safety concerns, children are not allowed in the ultrasound rooms during exams. Our front office staff cannot provide observation of children in our lobby area while testing is being conducted. An adult accompanying a patient to their appointment will only be allowed in the ultrasound room at the discretion of the ultrasound technician under special circumstances. We apologize for any inconvenience.  Labs done today, your results will be available in MyChart, we will contact you for abnormal readings.  Your physician recommends that you schedule a follow-up appointment : As scheduled   If you have any questions or concerns before your next appointment please send us  a message through [place] or call our office at [phone].    TO LEAVE A MESSAGE FOR THE NURSE SELECT OPTION 2, PLEASE LEAVE A MESSAGE INCLUDING: YOUR NAME DATE OF BIRTH CALL BACK NUMBER REASON FOR CALL**this is important as we prioritize the call backs  YOU WILL RECEIVE A CALL BACK THE SAME DAY AS LONG AS YOU CALL BEFORE 4:00 PM At the [facility], you and your health needs are our priority. As part of our continuing mission to provide you with exceptional heart  care, we have created designated Provider Care Teams. These Care Teams include your primary Cardiologist (physician) and Advanced Practice Providers (APPs- Physician Assistants and Nurse Practitioners) who all work together to provide you with the care you need, when you need it.   You may see any of the following providers on your designated Care Team at your next follow up: Dr Jules Oar Dr Peder Bourdon Dr. Alwin Baars Dr. Arta Lark Amy Marijane Shoulders, NP Ruddy Corral, [place] [facility] [place], [place] Dennise Fitz, NP [place] Lee, NP Shawnee Dellen, NP Luster Salters, PharmD Bevely Brush, PharmD   Please be sure to bring in all your medications bottles to every appointment.    Thank you for choosing [facility]

## 2023-09-04 DIAGNOSIS — I4819 Other persistent atrial fibrillation: Secondary | ICD-10-CM | POA: Diagnosis not present

## 2023-09-04 DIAGNOSIS — I5032 Chronic diastolic (congestive) heart failure: Secondary | ICD-10-CM | POA: Diagnosis not present

## 2023-09-04 DIAGNOSIS — I10 Essential (primary) hypertension: Secondary | ICD-10-CM | POA: Diagnosis not present

## 2023-09-04 DIAGNOSIS — I2584 Coronary atherosclerosis due to calcified coronary lesion: Secondary | ICD-10-CM | POA: Diagnosis not present

## 2023-09-06 NOTE — Progress Notes (Incomplete)
 ADVANCED HF CLINIC NOTE  Referring Physician: Sheldon Netter, PA Primary Care: Sheldon Netter, PA Primary Cardiologist: Madonna Large, DO HF Cardiologist: Toribio Fuel, MD  Chief Complaint: HF  HPI: Edward Weaver is a 68 y.o. male with moderate coronary artery calcification (214, 66th percentile), chronic HFpEF, OSA not CPAP, Atrial Fibrillation, TIA, Hypertension, Hyperlipidemia, bilateral carotid artery stenosis, obesity due to excess calories.    Patient is being followed by the practice for his underlying persistent atrial fibrillation, chronic HFpEF, and carotid disease. Patient noted that lower extremity swelling has been progressive.  He followed up with PCP and his Bumex  was increased from 0.5 mg p.o. daily to 1 mg p.o. daily.  He saw Dr Cindie 07/30/23. He was fluid overloaded. Bumex  was increased to 1 mg twice a day.   Today he returns for AHF follow up with his wife. Overall feeling ok just fatigued. Denies palpitations, CP, dizziness, or PND/Orthopnea. Patient with persistent BLE edema, has not been wearing compression socks as they are hard to get on. SOB with activity. Appetite ok, occasionally eats out. No fever or chills. Does not weight at home. Tried to take all medications but occasionally misses meds in the morning and has not been fully compliant with Bumex  BID. Denies ETOH, tobacco or drug use. Drinks ~32 oz a day.   Past Medical History:  Diagnosis Date   Arthritis    Atrial fibrillation (HCC)    Bell's palsy    Carotid artery stenosis, asymptomatic, bilateral    Cerebrovascular disease    Chronic diastolic (congestive) heart failure (HCC)    Hiatal hernia    Hyperlipidemia    Hypertension    Neuropathy    OSA (obstructive sleep apnea)    Prediabetes    TIA (transient ischemic attack)    Nov and/or Dec 2014    Current Outpatient Medications  Medication Sig Dispense Refill   albuterol  (VENTOLIN  HFA) 108 (90 Base) MCG/ACT inhaler Inhale 2 puffs into  the lungs every 6 (six) hours as needed for wheezing or shortness of breath. 1 each 0   atorvastatin  (LIPITOR) 40 MG tablet Take 1 tablet (40 mg total) by mouth at bedtime. 90 tablet 3   bumetanide  (BUMEX ) 1 MG tablet Take 1 tablet (1 mg total) by mouth 2 (two) times daily. 60 tablet 3   gabapentin  (NEURONTIN ) 300 MG capsule TAKE 1 CAPSULE (300 MG TOTAL) BY MOUTH 3 (THREE) TIMES DAILY. (Patient taking differently: Take 600 mg by mouth at bedtime.) 21 capsule 0   metolazone  (ZAROXOLYN ) 2.5 MG tablet Take 1 tablet (2.5 mg total) by mouth 3 (three) times a week. On Monday Wednesday friday 24 tablet 3   metoprolol  succinate (TOPROL -XL) 50 MG 24 hr tablet Take 1 tablet (50 mg total) by mouth daily. Take with or immediately following a meal. 90 tablet 3   potassium chloride  SA (KLOR-CON  M) 20 MEQ tablet Take 2 tablets (40 mEq total) by mouth 3 (three) times a week. 90 tablet 3   rivaroxaban  (XARELTO ) 20 MG TABS tablet Take 1 tablet (20 mg total) by mouth daily with supper. 90 tablet 3   sacubitril -valsartan  (ENTRESTO ) 24-26 MG Take 1 tablet by mouth 2 (two) times daily. 180 tablet 3   No current facility-administered medications for this visit.   Allergies  Allergen Reactions   Penicillins Other (See Comments)    Syncope, hypotension     Social History   Socioeconomic History   Marital status: Married    Spouse name: Heron  Number of children: 3   Years of education: Not on file   Highest education level: Not on file  Occupational History   Not on file  Tobacco Use   Smoking status: Never   Smokeless tobacco: Never  Vaping Use   Vaping status: Never Used  Substance and Sexual Activity   Alcohol  use: No   Drug use: No   Sexual activity: Yes    Birth control/protection: None  Other Topics Concern   Not on file  Social History Narrative   Not on file   Social Drivers of Health   Financial Resource Strain: Medium Risk (08/02/2023)   Overall Financial Resource Strain (CARDIA)     Difficulty of Paying Living Expenses: Somewhat hard  Food Insecurity: Food Insecurity Present (08/02/2023)   Hunger Vital Sign    Worried About Running Out of Food in the Last Year: Sometimes true    Ran Out of Food in the Last Year: Sometimes true  Transportation Needs: No Transportation Needs (08/02/2023)   PRAPARE - Administrator, Civil Service (Medical): No    Lack of Transportation (Non-Medical): No  Physical Activity: Not on file  Stress: Not on file  Social Connections: Not on file  Intimate Partner Violence: Not At Risk (07/06/2023)   Received from [facility]   Humiliation, Afraid, Rape, and Kick questionnaire    Fear of Current or Ex-Partner: No    Emotionally Abused: No    Physically Abused: No    Sexually Abused: No    Family History  Problem Relation Age of Onset   Hypertension Mother        Living   Cancer - Other Mother        Oral   Heart disease Mother    Hypertension Father    Alzheimer's disease Father        Living   Stroke Brother 20       Deceased   Healthy Brother        x1   Alzheimer's disease Paternal Aunt    Stroke Paternal Aunt        #1   Dementia Paternal Aunt        #2   Atrial fibrillation Son    Stroke Son    Hypertension Son    There were no vitals filed for this visit.  PHYSICAL EXAM: General:  well appearing.  No respiratory difficulty. Walked into clinic.  Neck: supple. JVD ~10 cm.  Cor: PMI nondisplaced. Regular rate & rhythm. No rubs, gallops or murmurs. Lungs: clear, diminished bases Extremities: no cyanosis, clubbing, rash, +2 BLE edema  Neuro: alert & oriented x 3. Moves all 4 extremities w/o difficulty. Affect pleasant.   ECG: A fib 72 bpm (Personally reviewed from 08/03/23)    ASSESSMENT & PLAN: 1. Chronic HFpEF Echo 2023 EF 50-55% Grade I DD.  Cardiac PET 5/24: LV EF 47%, stress EF 42%. Moderate coronary calcifications were present. Coronary calcifications were present in the LAD, LCx and RCA. Findings  are consistent with no ischemia and no infarction. The study is low risk.  NYHA III. Volume overloaded. Suspect in the setting of high sodium diet and diuretic noncompliance.  Encouraged compliance with bumex  1 mg twice a day. Continue Metolazone  2.5 mg Monday and Friday. Will have him take the next 2 Wednesdays as well. Start KDUR 40 mEq on metolazone  days.  Continue Entresto  24-26 mg BID Check BMET and BNP Discussed low salt food choices and limiting fluid  intake to < 2 liters per day.  Updated echo pending  2.  Persistent A Fib  Followed by EP. Needs optimize to be a candidate for cardioversion.  Rate controlled. Continue Toprol  XL 50 mg daily.  Continue Xarelto  daily. Denies abnormal bleeding.   3. Obesity  There is no height or weight on file to calculate BMI. Has been referred to pharmacy for GLP1   [place] Need to confirm with [place].   Pharmacy team working on patient assistance for Toprol  Xl, Entresto , and Xarelto . Lorrene approved.   Follow up in 2-3 weeks to reassess volume status.   Harlene CHRISTELLA Gainer, FNP  3:29 PM

## 2023-09-08 ENCOUNTER — Encounter (HOSPITAL_COMMUNITY)

## 2023-09-14 ENCOUNTER — Ambulatory Visit (HOSPITAL_COMMUNITY)
Admission: RE | Admit: 2023-09-14 | Discharge: 2023-09-14 | Disposition: A | Discharge status: Home / Self Care | Source: Ambulatory Visit | Attending: Family Medicine | Admitting: Family Medicine

## 2023-09-14 ENCOUNTER — Encounter (HOSPITAL_COMMUNITY): Payer: Self-pay

## 2023-09-14 ENCOUNTER — Ambulatory Visit (HOSPITAL_COMMUNITY): Payer: Self-pay | Admitting: Cardiology

## 2023-09-14 VITALS — BP 108/70 | HR 51 | Wt 296.0 lb

## 2023-09-14 DIAGNOSIS — Z91148 Patient's other noncompliance with medication regimen for other reason: Secondary | ICD-10-CM | POA: Diagnosis not present

## 2023-09-14 DIAGNOSIS — Z8249 Family history of ischemic heart disease and other diseases of the circulatory system: Secondary | ICD-10-CM | POA: Insufficient documentation

## 2023-09-14 DIAGNOSIS — Z79899 Other long term (current) drug therapy: Secondary | ICD-10-CM | POA: Insufficient documentation

## 2023-09-14 DIAGNOSIS — Z6841 Body Mass Index (BMI) 40.0 and over, adult: Secondary | ICD-10-CM | POA: Insufficient documentation

## 2023-09-14 DIAGNOSIS — R0601 Orthopnea: Secondary | ICD-10-CM | POA: Insufficient documentation

## 2023-09-14 DIAGNOSIS — I110 Hypertensive heart disease with heart failure: Secondary | ICD-10-CM | POA: Diagnosis not present

## 2023-09-14 DIAGNOSIS — Z5986 Financial insecurity: Secondary | ICD-10-CM | POA: Insufficient documentation

## 2023-09-14 DIAGNOSIS — I2510 Atherosclerotic heart disease of native coronary artery without angina pectoris: Secondary | ICD-10-CM | POA: Insufficient documentation

## 2023-09-14 DIAGNOSIS — G4733 Obstructive sleep apnea (adult) (pediatric): Secondary | ICD-10-CM | POA: Diagnosis not present

## 2023-09-14 DIAGNOSIS — E785 Hyperlipidemia, unspecified: Secondary | ICD-10-CM | POA: Insufficient documentation

## 2023-09-14 DIAGNOSIS — Z7901 Long term (current) use of anticoagulants: Secondary | ICD-10-CM | POA: Insufficient documentation

## 2023-09-14 DIAGNOSIS — E669 Obesity, unspecified: Secondary | ICD-10-CM | POA: Diagnosis not present

## 2023-09-14 DIAGNOSIS — I4819 Other persistent atrial fibrillation: Secondary | ICD-10-CM | POA: Diagnosis not present

## 2023-09-14 DIAGNOSIS — I5032 Chronic diastolic (congestive) heart failure: Secondary | ICD-10-CM | POA: Insufficient documentation

## 2023-09-14 LAB — BASIC METABOLIC PANEL WITH GFR
Anion gap: 9 (ref 5–15)
BUN: 16 mg/dL (ref 8–23)
CO2: 29 mmol/L (ref 22–32)
Calcium: 8.8 mg/dL — ABNORMAL LOW (ref 8.9–10.3)
Chloride: 100 mmol/L (ref 98–111)
Creatinine, Ser: 1.28 mg/dL — ABNORMAL HIGH (ref 0.61–1.24)
GFR, Estimated: >60 mL/min (ref >60)
Glucose, Bld: 114 mg/dL — ABNORMAL HIGH (ref 70–99)
Potassium: 3.9 mmol/L (ref 3.5–5.1)
Sodium: 138 mmol/L (ref 135–145)

## 2023-09-14 LAB — BRAIN NATRIURETIC PEPTIDE: B Natriuretic Peptide: 198.4 pg/mL — ABNORMAL HIGH (ref 0.0–100.0)

## 2023-09-14 NOTE — Progress Notes (Signed)
 ReDS Vest / Clip - 09/14/23 1400       ReDS Vest / Clip   Station Marker D    Ruler Value 45    ReDS Value Range Moderate volume overload    ReDS Actual Value 38

## 2023-09-14 NOTE — Patient Instructions (Addendum)
 Medication Changes:  PLEASE RESTART METOLAZONE  2.5MG  THREE TIMES WEEKLY   TAKE POTASSIUM THREE TIMES WEEKLY (ON METOLAZONE  DAYS)  PLEASE RESTART BUMEX  1MG  TWICE DAILY   Lab Work:  Labs done today, your results will be available in MyChart, we will contact you for abnormal readings.  Follow-Up in: 2 WEEKS AS SCHEDULED   At the [facility], you and your health needs are our priority. We have a designated team specialized in the treatment of Heart Failure. This Care Team includes your primary Heart Failure Specialized Cardiologist (physician), Advanced Practice Providers (APPs- Physician Assistants and Nurse Practitioners), and Pharmacist who all work together to provide you with the care you need, when you need it.   You may see any of the following providers on your designated Care Team at your next follow up:  Dr. Toribio Fuel Dr. Ezra Shuck Dr. Ria Commander Dr. Odis Brownie Greig Mosses, NP Caffie Shed, [place] [facility] [place], [place] Beckey Coe, NP [place] Lee, NP Tinnie Redman, PharmD   Please be sure to bring in all your medications bottles to every appointment.   Need to Contact Us :  If you have any questions or concerns before your next appointment please send us  a message through [place] or call our office at [phone].    TO LEAVE A MESSAGE FOR THE NURSE SELECT OPTION 2, PLEASE LEAVE A MESSAGE INCLUDING: YOUR NAME DATE OF BIRTH CALL BACK NUMBER REASON FOR CALL**this is important as we prioritize the call backs  YOU WILL RECEIVE A CALL BACK THE SAME DAY AS LONG AS YOU CALL BEFORE 4:00 PM

## 2023-09-14 NOTE — Progress Notes (Signed)
 ADVANCED HF CLINIC NOTE  Referring Physician: Sheldon Netter, PA Primary Care: Sheldon Netter, PA Primary Cardiologist: Madonna Large, DO HF Cardiologist: Toribio Fuel, MD  Chief Complaint: f/u for HFpEF  HPI: Edward Weaver is a 68 y.o. male with moderate coronary artery calcification (214, 66th percentile), chronic HFpEF, OSA not CPAP, Atrial Fibrillation, TIA, Hypertension, Hyperlipidemia, bilateral carotid artery stenosis, obesity due to excess calories.    Patient is being followed by the practice for his underlying persistent atrial fibrillation, chronic HFpEF, and carotid disease. Patient noted that lower extremity swelling has been progressive.  He followed up with PCP and his Bumex  was increased from 0.5 mg p.o. daily to 1 mg p.o. daily.  He saw Dr Cindie 07/30/23. He was fluid overloaded. Bumex  was increased to 1 mg twice a day.   Seen for return f/u on 6/4. Still w/ persistent b/l LEE. Noncompliant w/ compression socks. He also admitted to poor compliance w/ bumex , often failing to take BID as prescribed. He was instructed to improve compliance w/ Bumex  and ordered to increase frequency of metolazone  from twice weekly to trice weekly x 2 wks (MWF).   He returns back today for repeat f/u. Here w/ his wife and son. Remains volume overloaded. Wt only down 1 lb since last visit. ReDs 38%. He admits to continued poor compliance w/ meds/diuretics. Often skipping days and not taking BID most times. Also poor compliance w/ trice wkly metolazone . Still drinking lots of fluids, 2L/day. He denies resting dyspnea. Reports stable NYHA Class II-early III symptoms. Stable 2 pillow orthopnea.    Past Medical History:  Diagnosis Date   Arthritis    Atrial fibrillation (HCC)    Bell's palsy    Carotid artery stenosis, asymptomatic, bilateral    Cerebrovascular disease    Chronic diastolic (congestive) heart failure (HCC)    Hiatal hernia    Hyperlipidemia    Hypertension     Neuropathy    OSA (obstructive sleep apnea)    Prediabetes    TIA (transient ischemic attack)    Nov and/or Dec 2014    Current Outpatient Medications  Medication Sig Dispense Refill   albuterol  (VENTOLIN  HFA) 108 (90 Base) MCG/ACT inhaler Inhale 2 puffs into the lungs every 6 (six) hours as needed for wheezing or shortness of breath. 1 each 0   atorvastatin  (LIPITOR) 40 MG tablet Take 1 tablet (40 mg total) by mouth at bedtime. 90 tablet 3   bumetanide  (BUMEX ) 1 MG tablet Take 1 tablet (1 mg total) by mouth 2 (two) times daily. 60 tablet 3   gabapentin  (NEURONTIN ) 300 MG capsule Take 600 mg by mouth at bedtime.     metolazone  (ZAROXOLYN ) 2.5 MG tablet Take 1 tablet (2.5 mg total) by mouth 3 (three) times a week. On Monday Wednesday friday 24 tablet 3   metoprolol  succinate (TOPROL -XL) 50 MG 24 hr tablet Take 1 tablet (50 mg total) by mouth daily. Take with or immediately following a meal. 90 tablet 3   potassium chloride  SA (KLOR-CON  M) 20 MEQ tablet Take 2 tablets (40 mEq total) by mouth 3 (three) times a week. 90 tablet 3   rivaroxaban  (XARELTO ) 20 MG TABS tablet Take 1 tablet (20 mg total) by mouth daily with supper. 90 tablet 3   sacubitril -valsartan  (ENTRESTO ) 24-26 MG Take 1 tablet by mouth 2 (two) times daily. 180 tablet 3   No current facility-administered medications for this encounter.   Allergies  Allergen Reactions   Penicillins Other (See Comments)  Syncope, hypotension     Social History   Socioeconomic History   Marital status: Married    Spouse name: Heron   Number of children: 3   Years of education: Not on file   Highest education level: Not on file  Occupational History   Not on file  Tobacco Use   Smoking status: Never   Smokeless tobacco: Never  Vaping Use   Vaping status: Never Used  Substance and Sexual Activity   Alcohol  use: No   Drug use: No   Sexual activity: Yes    Birth control/protection: None  Other Topics Concern   Not on file   Social History Narrative   Not on file   Social Drivers of Health   Financial Resource Strain: Medium Risk (08/02/2023)   Overall Financial Resource Strain (CARDIA)    Difficulty of Paying Living Expenses: Somewhat hard  Food Insecurity: Food Insecurity Present (08/02/2023)   Hunger Vital Sign    Worried About Running Out of Food in the Last Year: Sometimes true    Ran Out of Food in the Last Year: Sometimes true  Transportation Needs: No Transportation Needs (08/02/2023)   PRAPARE - Administrator, Civil Service (Medical): No    Lack of Transportation (Non-Medical): No  Physical Activity: Not on file  Stress: Not on file  Social Connections: Not on file  Intimate Partner Violence: Not At Risk (07/06/2023)   Received from [facility]   Humiliation, Afraid, Rape, and Kick questionnaire    Within the last year, have you been afraid of your partner or ex-partner?: No    Within the last year, have you been humiliated or emotionally abused in other ways by your partner or ex-partner?: No    Within the last year, have you been kicked, hit, slapped, or otherwise physically hurt by your partner or ex-partner?: No    Within the last year, have you been raped or forced to have any kind of sexual activity by your partner or ex-partner?: No    Family History  Problem Relation Age of Onset   Hypertension Mother        Living   Cancer - Other Mother        Oral   Heart disease Mother    Hypertension Father    Alzheimer's disease Father        Living   Stroke Brother 33       Deceased   Healthy Brother        x1   Alzheimer's disease Paternal Aunt    Stroke Paternal Aunt        #1   Dementia Paternal Aunt        #2   Atrial fibrillation Son    Stroke Son    Hypertension Son    Vitals:   09/14/23 1446  BP: 108/70  Pulse: (!) 51  SpO2: 97%  Weight: 134.3 kg (296 lb)    PHYSICAL EXAM: ReDs 38%, abnormal  General:  Well appearing, obese. No respiratory  difficulty HEENT: normal Neck: supple. Thick neck, JVD not well visualized. Carotids 2+ bilat; no bruits. No lymphadenopathy or thyromegaly appreciated. Cor: PMI nondisplaced. Irregularly irregular rhythm and rate. No rubs, gallops or murmurs. Lungs: decreased BS bilaterally  Abdomen: soft, nontender, +distended. No hepatosplenomegaly. No bruits or masses. Good bowel sounds. Extremities: no cyanosis, clubbing, rash, 1+ b/l LE edema Neuro: alert & oriented x 3, cranial nerves grossly intact. moves all 4 extremities w/o difficulty. Affect  pleasant.    ECG: not performed (Personally reviewed)    ASSESSMENT & PLAN: 1. Chronic HFpEF - Echo 2023 EF 50-55% Grade I DD.  - Cardiac PET 5/24: LV EF 47%, stress EF 42%. Moderate coronary calcifications were present. Coronary calcifications were present in the LAD, LCx and RCA. Findings are consistent with no ischemia and no infarction. The study is low risk.  - stable NYHA III. Remains volume overloaded on exam and by ReDs, 38%, in the setting of poor compliance w/ diuretics + dietary indiscretion w/ sodium  - I offered patient several alternatives for diuresis including 1) referral to infusion clinic for 1 x dose of IV Lasix  to help jumpstart diuresis, 2) Furoscix , 3) enrollment in [place]. Pt declined all 3, citing he would prefer to go home and get back on track with his diuretics. He says he will improve compliance  - return to Bumex  1 mg bid dosing + 2.5 mg of Metolazone  MWF - check BMP and BNP today  - informed patient of risk for worsening fluid accumulation, need for hospitalization, respiratory failure etc if he does not improve compliance  - encouraged low sodium diet - doubt CardioMEMs will be beneficial. Doubt he would be compliant w/ sending daily transmissions   2.  Persistent A Fib  - Followed by EP. Needs optimize to be a candidate for cardioversion.  - Rate controlled. Continue Toprol  XL 50 mg daily.  - Continue Xarelto  daily.    3. Obesity  Body mass index is 47.78 kg/m. Has been referred to pharmacy for GLP1   F/u in 2-4 wks w/ APP.    Caffie Shed, PA-C  4:46 PM

## 2023-09-29 ENCOUNTER — Encounter (HOSPITAL_COMMUNITY): Payer: Self-pay

## 2023-09-29 ENCOUNTER — Ambulatory Visit (HOSPITAL_COMMUNITY)
Admission: RE | Admit: 2023-09-29 | Discharge: 2023-09-29 | Disposition: A | Discharge status: Home / Self Care | Source: Ambulatory Visit | Attending: Family Medicine

## 2023-09-29 VITALS — BP 134/80 | HR 68 | Ht 66.0 in | Wt 296.0 lb

## 2023-09-29 DIAGNOSIS — Z139 Encounter for screening, unspecified: Secondary | ICD-10-CM

## 2023-09-29 DIAGNOSIS — Z79899 Other long term (current) drug therapy: Secondary | ICD-10-CM | POA: Diagnosis not present

## 2023-09-29 DIAGNOSIS — Z91119 Patient's noncompliance with dietary regimen due to unspecified reason: Secondary | ICD-10-CM | POA: Diagnosis not present

## 2023-09-29 DIAGNOSIS — I4891 Unspecified atrial fibrillation: Secondary | ICD-10-CM | POA: Diagnosis not present

## 2023-09-29 DIAGNOSIS — E781 Pure hyperglyceridemia: Secondary | ICD-10-CM | POA: Diagnosis not present

## 2023-09-29 DIAGNOSIS — G4733 Obstructive sleep apnea (adult) (pediatric): Secondary | ICD-10-CM | POA: Diagnosis not present

## 2023-09-29 DIAGNOSIS — I5033 Acute on chronic diastolic (congestive) heart failure: Secondary | ICD-10-CM

## 2023-09-29 DIAGNOSIS — I5082 Biventricular heart failure: Secondary | ICD-10-CM | POA: Diagnosis not present

## 2023-09-29 DIAGNOSIS — I5032 Chronic diastolic (congestive) heart failure: Secondary | ICD-10-CM | POA: Diagnosis not present

## 2023-09-29 DIAGNOSIS — I6523 Occlusion and stenosis of bilateral carotid arteries: Secondary | ICD-10-CM | POA: Insufficient documentation

## 2023-09-29 DIAGNOSIS — Z8249 Family history of ischemic heart disease and other diseases of the circulatory system: Secondary | ICD-10-CM | POA: Diagnosis not present

## 2023-09-29 DIAGNOSIS — G629 Polyneuropathy, unspecified: Secondary | ICD-10-CM | POA: Diagnosis not present

## 2023-09-29 DIAGNOSIS — Z6841 Body Mass Index (BMI) 40.0 and over, adult: Secondary | ICD-10-CM | POA: Diagnosis not present

## 2023-09-29 DIAGNOSIS — E785 Hyperlipidemia, unspecified: Secondary | ICD-10-CM | POA: Insufficient documentation

## 2023-09-29 DIAGNOSIS — R7303 Prediabetes: Secondary | ICD-10-CM | POA: Diagnosis not present

## 2023-09-29 DIAGNOSIS — I4819 Other persistent atrial fibrillation: Secondary | ICD-10-CM

## 2023-09-29 DIAGNOSIS — Z8673 Personal history of transient ischemic attack (TIA), and cerebral infarction without residual deficits: Secondary | ICD-10-CM | POA: Insufficient documentation

## 2023-09-29 DIAGNOSIS — Z823 Family history of stroke: Secondary | ICD-10-CM | POA: Diagnosis not present

## 2023-09-29 DIAGNOSIS — I5031 Acute diastolic (congestive) heart failure: Secondary | ICD-10-CM | POA: Diagnosis not present

## 2023-09-29 DIAGNOSIS — I509 Heart failure, unspecified: Secondary | ICD-10-CM | POA: Diagnosis present

## 2023-09-29 DIAGNOSIS — E874 Mixed disorder of acid-base balance: Secondary | ICD-10-CM | POA: Diagnosis not present

## 2023-09-29 DIAGNOSIS — E876 Hypokalemia: Secondary | ICD-10-CM | POA: Diagnosis not present

## 2023-09-29 DIAGNOSIS — R918 Other nonspecific abnormal finding of lung field: Secondary | ICD-10-CM | POA: Diagnosis not present

## 2023-09-29 DIAGNOSIS — N1831 Chronic kidney disease, stage 3a: Secondary | ICD-10-CM | POA: Diagnosis not present

## 2023-09-29 DIAGNOSIS — I50811 Acute right heart failure: Secondary | ICD-10-CM | POA: Diagnosis present

## 2023-09-29 DIAGNOSIS — Z743 Need for continuous supervision: Secondary | ICD-10-CM | POA: Diagnosis not present

## 2023-09-29 DIAGNOSIS — I700 Atherosclerosis of aorta: Secondary | ICD-10-CM | POA: Diagnosis not present

## 2023-09-29 DIAGNOSIS — Z7901 Long term (current) use of anticoagulants: Secondary | ICD-10-CM | POA: Diagnosis not present

## 2023-09-29 DIAGNOSIS — I130 Hypertensive heart and chronic kidney disease with heart failure and stage 1 through stage 4 chronic kidney disease, or unspecified chronic kidney disease: Secondary | ICD-10-CM | POA: Diagnosis not present

## 2023-09-29 DIAGNOSIS — I110 Hypertensive heart disease with heart failure: Secondary | ICD-10-CM | POA: Diagnosis not present

## 2023-09-29 DIAGNOSIS — R0602 Shortness of breath: Secondary | ICD-10-CM | POA: Diagnosis not present

## 2023-09-29 DIAGNOSIS — I4821 Permanent atrial fibrillation: Secondary | ICD-10-CM | POA: Diagnosis not present

## 2023-09-29 DIAGNOSIS — R6881 Early satiety: Secondary | ICD-10-CM | POA: Insufficient documentation

## 2023-09-29 DIAGNOSIS — E669 Obesity, unspecified: Secondary | ICD-10-CM | POA: Insufficient documentation

## 2023-09-29 DIAGNOSIS — I10 Essential (primary) hypertension: Secondary | ICD-10-CM | POA: Diagnosis not present

## 2023-09-29 DIAGNOSIS — Z91199 Patient's noncompliance with other medical treatment and regimen due to unspecified reason: Secondary | ICD-10-CM | POA: Diagnosis not present

## 2023-09-29 DIAGNOSIS — I50813 Acute on chronic right heart failure: Secondary | ICD-10-CM | POA: Diagnosis not present

## 2023-09-29 DIAGNOSIS — N179 Acute kidney failure, unspecified: Secondary | ICD-10-CM | POA: Diagnosis not present

## 2023-09-29 DIAGNOSIS — D649 Anemia, unspecified: Secondary | ICD-10-CM | POA: Diagnosis not present

## 2023-09-29 DIAGNOSIS — I2510 Atherosclerotic heart disease of native coronary artery without angina pectoris: Secondary | ICD-10-CM | POA: Diagnosis not present

## 2023-09-29 DIAGNOSIS — Z5986 Financial insecurity: Secondary | ICD-10-CM | POA: Diagnosis not present

## 2023-09-29 LAB — BASIC METABOLIC PANEL WITH GFR
Anion gap: 11 (ref 5–15)
BUN: 22 mg/dL (ref 8–23)
CO2: 30 mmol/L (ref 22–32)
Calcium: 9.3 mg/dL (ref 8.9–10.3)
Chloride: 96 mmol/L — ABNORMAL LOW (ref 98–111)
Creatinine, Ser: 1.26 mg/dL — ABNORMAL HIGH (ref 0.61–1.24)
GFR, Estimated: >60 mL/min (ref >60)
Glucose, Bld: 111 mg/dL — ABNORMAL HIGH (ref 70–99)
Potassium: 3.5 mmol/L (ref 3.5–5.1)
Sodium: 137 mmol/L (ref 135–145)

## 2023-09-29 LAB — MAGNESIUM: Magnesium: 2.1 mg/dL (ref 1.7–2.4)

## 2023-09-29 LAB — BRAIN NATRIURETIC PEPTIDE: B Natriuretic Peptide: 143.9 pg/mL — ABNORMAL HIGH (ref 0.0–100.0)

## 2023-09-29 MED ORDER — BUMETANIDE 2 MG PO TABS: 2.0000 mg | ORAL_TABLET | Freq: Two times a day (BID) | ORAL | 11 refills | Status: AC

## 2023-09-29 MED ORDER — POTASSIUM CHLORIDE CRYS ER 20 MEQ PO TBCR: 20.0000 meq | EXTENDED_RELEASE_TABLET | Freq: Every day | ORAL | 3 refills | Status: DC

## 2023-09-29 NOTE — Addendum Note (Signed)
 Encounter addended by: Gerome Annabella CROME, RN on: 09/29/2023 4:04 PM  Actions taken: Visit diagnoses modified, Order list changed, Diagnosis association updated

## 2023-09-29 NOTE — Addendum Note (Signed)
 Encounter addended by: Gerome Annabella CROME, RN on: 09/29/2023 3:47 PM  Actions taken: Visit diagnoses modified, Pharmacy for encounter modified, Diagnosis association updated, Order list changed, Clinical Note Signed, Charge Capture section accepted

## 2023-09-29 NOTE — Progress Notes (Signed)
 ADVANCED HF CLINIC NOTE  Primary Care: [facility], [place] Primary Cardiologist: Madonna Large, DO HF Cardiologist: Dr. Cherrie   HPI: Edward Weaver is a 68 y.o.. male with moderate coronary artery calcification (214, 66th percentile), chronic HFpEF, OSA not CPAP, Atrial Fibrillation, TIA, HTN, HLD, bilateral carotid artery stenosis, and obesity.   Patient is being followed by the practice for his underlying persistent atrial fibrillation, chronic HFpEF, and carotid disease. Patient noted that lower extremity swelling has been progressive.  He followed up with PCP and his Bumex  was increased from 0.5 mg p.o. daily to 1 mg p.o. daily.  He saw Dr Cindie 07/30/23. He was fluid overloaded. Bumex  was increased to 1 mg bid.  Struggled with volume overload 2/2 medication non-compliance and dietary indiscretion.  Today he returns for HF follow up with his wife. Overall feeling fair. Weight down to 288 lbs after a couple doses of metolazone  but then back up to 296 lbs. He is SOB walking on flat ground, legs and belly swelling and he has new orthopnea. Denies palpitations, abnormal bleeding, CP, dizziness. He has early satiety. Taking all medications. He snores. Continues to drink > 2L/day and eats fast food.    Past Medical History:  Diagnosis Date   Arthritis    Atrial fibrillation (HCC)    Bell's palsy    Carotid artery stenosis, asymptomatic, bilateral    Cerebrovascular disease    Chronic diastolic (congestive) heart failure (HCC)    Hiatal hernia    Hyperlipidemia    Hypertension    Neuropathy    OSA (obstructive sleep apnea)    Prediabetes    TIA (transient ischemic attack)    Nov and/or Dec 2014    Current Outpatient Medications  Medication Sig Dispense Refill   albuterol  (VENTOLIN  HFA) 108 (90 Base) MCG/ACT inhaler Inhale 2 puffs into the lungs every 6 (six) hours as needed for wheezing or shortness of breath. 1 each 0   atorvastatin  (LIPITOR) 40 MG tablet Take 1 tablet  (40 mg total) by mouth at bedtime. 90 tablet 3   bumetanide  (BUMEX ) 1 MG tablet Take 1 tablet (1 mg total) by mouth 2 (two) times daily. 60 tablet 3   gabapentin  (NEURONTIN ) 300 MG capsule Take 600 mg by mouth at bedtime.     metolazone  (ZAROXOLYN ) 2.5 MG tablet Take 1 tablet (2.5 mg total) by mouth 3 (three) times a week. On Monday Wednesday friday 24 tablet 3   metoprolol  succinate (TOPROL -XL) 50 MG 24 hr tablet Take 1 tablet (50 mg total) by mouth daily. Take with or immediately following a meal. 90 tablet 3   potassium chloride  SA (KLOR-CON  M) 20 MEQ tablet Take 2 tablets (40 mEq total) by mouth 3 (three) times a week. 90 tablet 3   rivaroxaban  (XARELTO ) 20 MG TABS tablet Take 1 tablet (20 mg total) by mouth daily with supper. 90 tablet 3   sacubitril -valsartan  (ENTRESTO ) 24-26 MG Take 1 tablet by mouth 2 (two) times daily. 180 tablet 3   No current facility-administered medications for this encounter.   Allergies  Allergen Reactions   Penicillins Other (See Comments)    Syncope, hypotension     Social History   Socioeconomic History   Marital status: Married    Spouse name: Heron   Number of children: 3   Years of education: Not on file   Highest education level: Not on file  Occupational History   Not on file  Tobacco Use   Smoking status: Never  Smokeless tobacco: Never  Vaping Use   Vaping status: Never Used  Substance and Sexual Activity   Alcohol  use: No   Drug use: No   Sexual activity: Yes    Birth control/protection: None  Other Topics Concern   Not on file  Social History Narrative   Not on file   Social Drivers of Health   Financial Resource Strain: Medium Risk (08/02/2023)   Overall Financial Resource Strain (CARDIA)    Difficulty of Paying Living Expenses: Somewhat hard  Food Insecurity: Food Insecurity Present (08/02/2023)   Hunger Vital Sign    Worried About Running Out of Food in the Last Year: Sometimes true    Ran Out of Food in the Last Year:  Sometimes true  Transportation Needs: No Transportation Needs (08/02/2023)   PRAPARE - Administrator, Civil Service (Medical): No    Lack of Transportation (Non-Medical): No  Physical Activity: Not on file  Stress: Not on file  Social Connections: Not on file  Intimate Partner Violence: Not At Risk (07/06/2023)   Received from [facility]   Humiliation, Afraid, Rape, and Kick questionnaire    Within the last year, have you been afraid of your partner or ex-partner?: No    Within the last year, have you been humiliated or emotionally abused in other ways by your partner or ex-partner?: No    Within the last year, have you been kicked, hit, slapped, or otherwise physically hurt by your partner or ex-partner?: No    Within the last year, have you been raped or forced to have any kind of sexual activity by your partner or ex-partner?: No    Family History  Problem Relation Age of Onset   Hypertension Mother        Living   Cancer - Other Mother        Oral   Heart disease Mother    Hypertension Father    Alzheimer's disease Father        Living   Stroke Brother 17       Deceased   Healthy Brother        x1   Alzheimer's disease Paternal Aunt    Stroke Paternal Aunt        #1   Dementia Paternal Aunt        #2   Atrial fibrillation Son    Stroke Son    Hypertension Son    Wt Readings from Last 3 Encounters:  09/29/23 134.3 kg (296 lb)  09/14/23 134.3 kg (296 lb)  08/18/23 135.1 kg (297 lb 12.8 oz)   BP 134/80   Pulse 68   Ht 5' 6 (1.676 m)   Wt 134.3 kg (296 lb)   SpO2 95%   BMI 47.78 kg/m   PHYSICAL EXAM: General:  NAD. No resp difficulty HEENT: Normal Neck: Supple. Thick neck, JVP to jaw Cor: Irregular rate & rhythm. No rubs, gallops or murmurs. Lungs: Clear, faint crackles in bases Abdomen: Obese, nontender, +distended.  Extremities: No cyanosis, clubbing, rash, 2+ BLE to knees edema Neuro: Alert & oriented x 3, moves all 4 extremities w/o  difficulty. Affect pleasant.  ReDs reading: 49%, abnormal  ASSESSMENT & PLAN: 1. Acute on Chronic HFpEF - Echo 2023: EF 50-55% Grade I DD.  - Cardiac PET 5/24: LV EF 47%, stress EF 42%. Moderate coronary calcifications were present. Coronary calcifications were present in the LAD, LCx and RCA. Findings are consistent with no ischemia and no infarction.  The study is low risk.  - NYHA III-IIIb. Volume overloaded, ReDs 49%.  - Use Furoscix  + metolazone  2.5 mg + 60 KCL daily x 3 days - After 3 days, resume higher dose of Bumex  at 2 mg bid + 20 KCL daily. (May ultimately respond better to torsemide) - Continue Entresto  24/26 mg bid - Consider SGLT2i down the road. - Echo arranged for next week - Labs today. Repeat BMET in 1 week  2.  Persistent A Fib  - Followed by EP. Needs optimization to be a candidate for cardioversion.  - Rate controlled.  - Continue Toprol  XL 50 mg daily.  - Continue Xarelto  20 daily. Denies abnormal bleeding.   3. Obesity  - Body mass index is 47.78 kg/m. - Has been referred to pharmacy for GLP1   4. OSA - Needs CPAP  5. [place] - Wife on board to help with compliance.  Follow up in 1 week with APP. He is high risk for admission.  Harlene CHRISTELLA Gainer, FNP  2:56 PM

## 2023-09-29 NOTE — Addendum Note (Signed)
 Encounter addended by: Gerome Annabella CROME, RN on: 09/29/2023 4:22 PM  Actions taken: Clinical Note Signed

## 2023-09-29 NOTE — Patient Instructions (Addendum)
 Thank you for coming in today  If you had labs drawn today, any labs that are abnormal the clinic will call you No news is good news  Medications: Your provider has order Furoscix  for you. This is an on-body infuser that gives you a dose of Furosemide .   It will be shipped to your home from [facility], they will call you before shipping  Ensure you write down the time you start your infusion so that if there is a problem you will know how long the infusion lasted  Use Furoscix  only AS DIRECTED by our office  Dosing Directions:   Day 1= 09/30/2023 with Metolazone  2.5 mg and 60 meq of Potassium (hold Bumex )  Day 2= 10/01/2023 with Metolazone  2.5 mg and 60 meq of Potassium (hold Bumex )   Day 3= 10/02/2023 with Metolazone  2.5 mg and 60 meq of Potassium (hold Bumex )   10/03/2023 restart Bumex  2 mg twice daily with taking 20 meq of Potassium 1 tablet daily   Follow up appointments:  Your physician recommends that you schedule a follow-up appointment in:  1 week in clinic   Do the following things EVERYDAY: Weigh yourself in the morning before breakfast. Write it down and keep it in a log. Take your medicines as prescribed Eat low salt foods--Limit salt (sodium) to 2000 mg per day.  Stay as active as you can everyday Limit all fluids for the day to less than 2 liters   At the [facility], you and your health needs are our priority. As part of our continuing mission to provide you with exceptional heart care, we have created designated Provider Care Teams. These Care Teams include your primary Cardiologist (physician) and Advanced Practice Providers (APPs- Physician Assistants and Nurse Practitioners) who all work together to provide you with the care you need, when you need it.   You may see any of the following providers on your designated Care Team at your next follow up: Dr Toribio Fuel Dr Ezra Shuck Dr. Ria Gardenia Greig Lenetta,  NP Caffie Shed, [place] [facility] [place], [place] Beckey Coe, NP Tinnie Redman, PharmD   Please be sure to bring in all your medications bottles to every appointment.    Thank you for choosing [facility]  If you have any questions or concerns before your next appointment please send us  a message through [employer] or call our office at [phone].    TO LEAVE A MESSAGE FOR THE NURSE SELECT OPTION 2, PLEASE LEAVE A MESSAGE INCLUDING: YOUR NAME DATE OF BIRTH CALL BACK NUMBER REASON FOR CALL**this is important as we prioritize the call backs  YOU WILL RECEIVE A CALL BACK THE SAME DAY AS LONG AS YOU CALL BEFORE 4:00 PM

## 2023-09-29 NOTE — Progress Notes (Signed)
 Provided patient education on Furoscix  using demo kits and Furoscix  video, QR code provided on AVS for further viewing. Furoscix  order submitted online, ov note and ins card uploaded to Furoscix  Direct.   Patient was given 2 samples

## 2023-09-30 ENCOUNTER — Ambulatory Visit (HOSPITAL_COMMUNITY): Payer: Self-pay | Admitting: Family Medicine

## 2023-09-30 ENCOUNTER — Encounter (HOSPITAL_BASED_OUTPATIENT_CLINIC_OR_DEPARTMENT_OTHER): Payer: Self-pay

## 2023-09-30 ENCOUNTER — Emergency Department (HOSPITAL_BASED_OUTPATIENT_CLINIC_OR_DEPARTMENT_OTHER)

## 2023-09-30 ENCOUNTER — Inpatient Hospital Stay (HOSPITAL_BASED_OUTPATIENT_CLINIC_OR_DEPARTMENT_OTHER)
Admission: EM | Admit: 2023-09-30 | Discharge: 2023-10-04 | DRG: 291 | Disposition: A | Discharge status: Home / Self Care | Source: Ambulatory Visit | Attending: Internal Medicine | Admitting: Internal Medicine

## 2023-09-30 ENCOUNTER — Other Ambulatory Visit: Payer: Self-pay

## 2023-09-30 DIAGNOSIS — I4819 Other persistent atrial fibrillation: Secondary | ICD-10-CM | POA: Diagnosis not present

## 2023-09-30 DIAGNOSIS — I50811 Acute right heart failure: Secondary | ICD-10-CM | POA: Diagnosis present

## 2023-09-30 DIAGNOSIS — Z91199 Patient's noncompliance with other medical treatment and regimen due to unspecified reason: Secondary | ICD-10-CM

## 2023-09-30 DIAGNOSIS — N179 Acute kidney failure, unspecified: Secondary | ICD-10-CM | POA: Diagnosis present

## 2023-09-30 DIAGNOSIS — R0602 Shortness of breath: Secondary | ICD-10-CM | POA: Diagnosis not present

## 2023-09-30 DIAGNOSIS — I4891 Unspecified atrial fibrillation: Secondary | ICD-10-CM

## 2023-09-30 DIAGNOSIS — Z79899 Other long term (current) drug therapy: Secondary | ICD-10-CM

## 2023-09-30 DIAGNOSIS — Z7901 Long term (current) use of anticoagulants: Secondary | ICD-10-CM

## 2023-09-30 DIAGNOSIS — E785 Hyperlipidemia, unspecified: Secondary | ICD-10-CM | POA: Diagnosis not present

## 2023-09-30 DIAGNOSIS — G629 Polyneuropathy, unspecified: Secondary | ICD-10-CM | POA: Diagnosis present

## 2023-09-30 DIAGNOSIS — Z823 Family history of stroke: Secondary | ICD-10-CM

## 2023-09-30 DIAGNOSIS — I50813 Acute on chronic right heart failure: Secondary | ICD-10-CM

## 2023-09-30 DIAGNOSIS — I700 Atherosclerosis of aorta: Secondary | ICD-10-CM

## 2023-09-30 DIAGNOSIS — E876 Hypokalemia: Secondary | ICD-10-CM | POA: Diagnosis present

## 2023-09-30 DIAGNOSIS — I5033 Acute on chronic diastolic (congestive) heart failure: Secondary | ICD-10-CM | POA: Diagnosis not present

## 2023-09-30 DIAGNOSIS — R7303 Prediabetes: Secondary | ICD-10-CM | POA: Diagnosis present

## 2023-09-30 DIAGNOSIS — I509 Heart failure, unspecified: Secondary | ICD-10-CM

## 2023-09-30 DIAGNOSIS — I110 Hypertensive heart disease with heart failure: Secondary | ICD-10-CM | POA: Diagnosis not present

## 2023-09-30 DIAGNOSIS — D649 Anemia, unspecified: Secondary | ICD-10-CM | POA: Diagnosis present

## 2023-09-30 DIAGNOSIS — Z820 Family history of epilepsy and other diseases of the nervous system: Secondary | ICD-10-CM

## 2023-09-30 DIAGNOSIS — E874 Mixed disorder of acid-base balance: Secondary | ICD-10-CM | POA: Diagnosis present

## 2023-09-30 DIAGNOSIS — I10 Essential (primary) hypertension: Secondary | ICD-10-CM | POA: Diagnosis present

## 2023-09-30 DIAGNOSIS — Z5986 Financial insecurity: Secondary | ICD-10-CM

## 2023-09-30 DIAGNOSIS — Z743 Need for continuous supervision: Secondary | ICD-10-CM | POA: Diagnosis not present

## 2023-09-30 DIAGNOSIS — Z91119 Patient's noncompliance with dietary regimen due to unspecified reason: Secondary | ICD-10-CM

## 2023-09-30 DIAGNOSIS — N1831 Chronic kidney disease, stage 3a: Secondary | ICD-10-CM | POA: Diagnosis present

## 2023-09-30 DIAGNOSIS — Z8673 Personal history of transient ischemic attack (TIA), and cerebral infarction without residual deficits: Secondary | ICD-10-CM

## 2023-09-30 DIAGNOSIS — E781 Pure hyperglyceridemia: Secondary | ICD-10-CM | POA: Diagnosis present

## 2023-09-30 DIAGNOSIS — Z6841 Body Mass Index (BMI) 40.0 and over, adult: Secondary | ICD-10-CM

## 2023-09-30 DIAGNOSIS — G4733 Obstructive sleep apnea (adult) (pediatric): Secondary | ICD-10-CM | POA: Diagnosis not present

## 2023-09-30 DIAGNOSIS — R918 Other nonspecific abnormal finding of lung field: Secondary | ICD-10-CM | POA: Diagnosis present

## 2023-09-30 DIAGNOSIS — Z91148 Patient's other noncompliance with medication regimen for other reason: Secondary | ICD-10-CM

## 2023-09-30 DIAGNOSIS — Z880 Allergy status to penicillin: Secondary | ICD-10-CM

## 2023-09-30 DIAGNOSIS — I2510 Atherosclerotic heart disease of native coronary artery without angina pectoris: Secondary | ICD-10-CM | POA: Diagnosis present

## 2023-09-30 DIAGNOSIS — I5082 Biventricular heart failure: Secondary | ICD-10-CM | POA: Diagnosis present

## 2023-09-30 DIAGNOSIS — Z8249 Family history of ischemic heart disease and other diseases of the circulatory system: Secondary | ICD-10-CM

## 2023-09-30 DIAGNOSIS — I4821 Permanent atrial fibrillation: Secondary | ICD-10-CM | POA: Diagnosis present

## 2023-09-30 DIAGNOSIS — I130 Hypertensive heart and chronic kidney disease with heart failure and stage 1 through stage 4 chronic kidney disease, or unspecified chronic kidney disease: Principal | ICD-10-CM | POA: Diagnosis present

## 2023-09-30 LAB — BASIC METABOLIC PANEL WITH GFR
Anion gap: 11 (ref 5–15)
Anion gap: 12 (ref 5–15)
BUN: 23 mg/dL (ref 8–23)
BUN: 24 mg/dL — ABNORMAL HIGH (ref 8–23)
CO2: 30 mmol/L (ref 22–32)
CO2: 32 mmol/L (ref 22–32)
Calcium: 9.2 mg/dL (ref 8.9–10.3)
Calcium: 9.4 mg/dL (ref 8.9–10.3)
Chloride: 95 mmol/L — ABNORMAL LOW (ref 98–111)
Chloride: 95 mmol/L — ABNORMAL LOW (ref 98–111)
Creatinine, Ser: 1.29 mg/dL — ABNORMAL HIGH (ref 0.61–1.24)
Creatinine, Ser: 1.31 mg/dL — ABNORMAL HIGH (ref 0.61–1.24)
GFR, Estimated: 59 mL/min — ABNORMAL LOW (ref >60)
GFR, Estimated: >60 mL/min (ref >60)
Glucose, Bld: 110 mg/dL — ABNORMAL HIGH (ref 70–99)
Glucose, Bld: 126 mg/dL — ABNORMAL HIGH (ref 70–99)
Potassium: 3.1 mmol/L — ABNORMAL LOW (ref 3.5–5.1)
Potassium: 3.6 mmol/L (ref 3.5–5.1)
Sodium: 137 mmol/L (ref 135–145)
Sodium: 138 mmol/L (ref 135–145)

## 2023-09-30 LAB — COMPREHENSIVE METABOLIC PANEL WITH GFR
ALT: 28 U/L (ref 0–44)
AST: 21 U/L (ref 15–41)
Albumin: 3.5 g/dL (ref 3.5–5.0)
Alkaline Phosphatase: 70 U/L (ref 38–126)
Anion gap: 13 (ref 5–15)
BUN: 24 mg/dL — ABNORMAL HIGH (ref 8–23)
CO2: 30 mmol/L (ref 22–32)
Calcium: 9 mg/dL (ref 8.9–10.3)
Chloride: 93 mmol/L — ABNORMAL LOW (ref 98–111)
Creatinine, Ser: 1.56 mg/dL — ABNORMAL HIGH (ref 0.61–1.24)
GFR, Estimated: 48 mL/min — ABNORMAL LOW (ref >60)
Glucose, Bld: 162 mg/dL — ABNORMAL HIGH (ref 70–99)
Potassium: 2.8 mmol/L — ABNORMAL LOW (ref 3.5–5.1)
Sodium: 136 mmol/L (ref 135–145)
Total Bilirubin: 1 mg/dL (ref 0.0–1.2)
Total Protein: 7 g/dL (ref 6.5–8.1)

## 2023-09-30 LAB — CBC
HCT: 38.2 % — ABNORMAL LOW (ref 39.0–52.0)
Hemoglobin: 12.9 g/dL — ABNORMAL LOW (ref 13.0–17.0)
MCH: 29.6 pg (ref 26.0–34.0)
MCHC: 33.8 g/dL (ref 30.0–36.0)
MCV: 87.6 fL (ref 80.0–100.0)
Platelets: 292 K/uL (ref 150–400)
RBC: 4.36 MIL/uL (ref 4.22–5.81)
RDW: 12.7 % (ref 11.5–15.5)
WBC: 7.3 K/uL (ref 4.0–10.5)
nRBC: 0 % (ref 0.0–0.2)

## 2023-09-30 LAB — URINALYSIS, COMPLETE (UACMP) WITH MICROSCOPIC
Bacteria, UA: NONE SEEN
Bilirubin Urine: NEGATIVE
Glucose, UA: NEGATIVE mg/dL
Hgb urine dipstick: NEGATIVE
Ketones, ur: NEGATIVE mg/dL
Leukocytes,Ua: NEGATIVE
Nitrite: NEGATIVE
Protein, ur: NEGATIVE mg/dL
Specific Gravity, Urine: 1.027 (ref 1.005–1.030)
pH: 5 (ref 5.0–8.0)

## 2023-09-30 LAB — IRON AND TIBC
Iron: 63 ug/dL (ref 45–182)
Saturation Ratios: 17 % — ABNORMAL LOW (ref 17.9–39.5)
TIBC: 364 ug/dL (ref 250–450)
UIBC: 301 ug/dL

## 2023-09-30 LAB — FERRITIN: Ferritin: 427 ng/mL — ABNORMAL HIGH (ref 24–336)

## 2023-09-30 LAB — TROPONIN I (HIGH SENSITIVITY)
Troponin I (High Sensitivity): 8 ng/L (ref <18)
Troponin I (High Sensitivity): 8 ng/L (ref <18)

## 2023-09-30 LAB — CBC WITH DIFFERENTIAL/PLATELET
Abs Immature Granulocytes: 0.02 K/uL (ref 0.00–0.07)
Basophils Absolute: 0.1 K/uL (ref 0.0–0.1)
Basophils Relative: 1 %
Eosinophils Absolute: 0.5 K/uL (ref 0.0–0.5)
Eosinophils Relative: 7 %
HCT: 38 % — ABNORMAL LOW (ref 39.0–52.0)
Hemoglobin: 12.9 g/dL — ABNORMAL LOW (ref 13.0–17.0)
Immature Granulocytes: 0 %
Lymphocytes Relative: 27 %
Lymphs Abs: 1.8 K/uL (ref 0.7–4.0)
MCH: 29.9 pg (ref 26.0–34.0)
MCHC: 33.9 g/dL (ref 30.0–36.0)
MCV: 88.2 fL (ref 80.0–100.0)
Monocytes Absolute: 0.4 K/uL (ref 0.1–1.0)
Monocytes Relative: 6 %
Neutro Abs: 4 K/uL (ref 1.7–7.7)
Neutrophils Relative %: 59 %
Platelets: 285 K/uL (ref 150–400)
RBC: 4.31 MIL/uL (ref 4.22–5.81)
RDW: 12.5 % (ref 11.5–15.5)
WBC: 6.8 K/uL (ref 4.0–10.5)
nRBC: 0 % (ref 0.0–0.2)

## 2023-09-30 LAB — BLOOD GAS, VENOUS
Acid-Base Excess: 13.7 mmol/L — ABNORMAL HIGH (ref 0.0–2.0)
Bicarbonate: 39 mmol/L — ABNORMAL HIGH (ref 20.0–28.0)
Drawn by: 8634
O2 Saturation: 84.9 %
Patient temperature: 36.8
pCO2, Ven: 50 mmHg (ref 44–60)
pH, Ven: 7.5 — ABNORMAL HIGH (ref 7.25–7.43)
pO2, Ven: 51 mmHg — ABNORMAL HIGH (ref 32–45)

## 2023-09-30 LAB — TSH: TSH: 1.79 u[IU]/mL (ref 0.350–4.500)

## 2023-09-30 LAB — BRAIN NATRIURETIC PEPTIDE: B Natriuretic Peptide: 100.4 pg/mL — ABNORMAL HIGH (ref 0.0–100.0)

## 2023-09-30 LAB — PRO BRAIN NATRIURETIC PEPTIDE: Pro Brain Natriuretic Peptide: 963 pg/mL — ABNORMAL HIGH (ref <300.0)

## 2023-09-30 LAB — HIV ANTIBODY (ROUTINE TESTING W REFLEX): HIV Screen 4th Generation wRfx: NONREACTIVE

## 2023-09-30 LAB — PHOSPHORUS: Phosphorus: 2.6 mg/dL (ref 2.5–4.6)

## 2023-09-30 LAB — HEMOGLOBIN A1C
Hgb A1c MFr Bld: 6.2 % — ABNORMAL HIGH (ref 4.8–5.6)
Mean Plasma Glucose: 131.24 mg/dL

## 2023-09-30 LAB — TROPONIN T, HIGH SENSITIVITY
Troponin T High Sensitivity: <15 ng/L (ref <19)
Troponin T High Sensitivity: <15 ng/L (ref <19)

## 2023-09-30 LAB — RETICULOCYTES
Immature Retic Fract: 11.3 % (ref 2.3–15.9)
RBC.: 4.29 MIL/uL (ref 4.22–5.81)
Retic Count, Absolute: 72.1 K/uL (ref 19.0–186.0)
Retic Ct Pct: 1.7 % (ref 0.4–3.1)

## 2023-09-30 LAB — PROCALCITONIN: Procalcitonin: <0.1 ng/mL

## 2023-09-30 LAB — MAGNESIUM: Magnesium: 2.2 mg/dL (ref 1.7–2.4)

## 2023-09-30 LAB — LIPASE, BLOOD: Lipase: 38 U/L (ref 11–51)

## 2023-09-30 LAB — CK: Total CK: 66 U/L (ref 49–397)

## 2023-09-30 LAB — VITAMIN B12: Vitamin B-12: 188 pg/mL (ref 180–914)

## 2023-09-30 LAB — FOLATE: Folate: 10.4 ng/mL (ref >5.9)

## 2023-09-30 MED ORDER — ORAL CARE MOUTH RINSE: 15.0000 mL | OROMUCOSAL | Status: DC | PRN

## 2023-09-30 MED ORDER — LIVING BETTER WITH HEART FAILURE BOOK: Freq: Once | Status: AC

## 2023-09-30 MED ORDER — ATORVASTATIN CALCIUM 40 MG PO TABS
40.0000 mg | ORAL_TABLET | Freq: Every day | ORAL | Status: DC
  Administered 2023-09-30 – 2023-10-03 (×4): 40 mg via ORAL
  Filled 2023-09-30 (×4): qty 1

## 2023-09-30 MED ORDER — FUROSEMIDE 10 MG/ML IJ SOLN
80.0000 mg | Freq: Two times a day (BID) | INTRAMUSCULAR | Status: DC
  Administered 2023-09-30 – 2023-10-03 (×6): 80 mg via INTRAVENOUS
  Filled 2023-09-30 (×6): qty 8

## 2023-09-30 MED ORDER — SODIUM CHLORIDE 0.9 % IV SOLN: 250.0000 mL | INTRAVENOUS | Status: AC | PRN

## 2023-09-30 MED ORDER — RIVAROXABAN 20 MG PO TABS
20.0000 mg | ORAL_TABLET | Freq: Every day | ORAL | Status: DC
  Administered 2023-09-30 – 2023-10-03 (×4): 20 mg via ORAL
  Filled 2023-09-30 (×4): qty 1

## 2023-09-30 MED ORDER — ACETAMINOPHEN 325 MG PO TABS: 650.0000 mg | ORAL_TABLET | Freq: Four times a day (QID) | ORAL | Status: DC | PRN

## 2023-09-30 MED ORDER — ONDANSETRON HCL 4 MG/2ML IJ SOLN: 4.0000 mg | Freq: Four times a day (QID) | INTRAMUSCULAR | Status: DC | PRN

## 2023-09-30 MED ORDER — POTASSIUM CHLORIDE CRYS ER 20 MEQ PO TBCR
40.0000 meq | EXTENDED_RELEASE_TABLET | Freq: Two times a day (BID) | ORAL | Status: AC
  Administered 2023-09-30 – 2023-10-01 (×2): 40 meq via ORAL
  Filled 2023-09-30 (×2): qty 2

## 2023-09-30 MED ORDER — SODIUM CHLORIDE 0.9% FLUSH: 3.0000 mL | INTRAVENOUS | Status: DC | PRN

## 2023-09-30 MED ORDER — SODIUM CHLORIDE 0.9% FLUSH
3.0000 mL | Freq: Two times a day (BID) | INTRAVENOUS | Status: DC
  Administered 2023-09-30 – 2023-10-04 (×8): 3 mL via INTRAVENOUS

## 2023-09-30 MED ORDER — IOHEXOL 350 MG/ML SOLN
80.0000 mL | Freq: Once | INTRAVENOUS | Status: AC | PRN
  Administered 2023-09-30: 80 mL via INTRAVENOUS

## 2023-09-30 MED ORDER — ACETAMINOPHEN 650 MG RE SUPP: 650.0000 mg | Freq: Four times a day (QID) | RECTAL | Status: DC | PRN

## 2023-09-30 MED ORDER — ALBUTEROL SULFATE HFA 108 (90 BASE) MCG/ACT IN AERS: 2.0000 | INHALATION_SPRAY | Freq: Four times a day (QID) | RESPIRATORY_TRACT | Status: DC | PRN

## 2023-09-30 MED ORDER — ALBUTEROL SULFATE (2.5 MG/3ML) 0.083% IN NEBU: 2.5000 mg | INHALATION_SOLUTION | Freq: Four times a day (QID) | RESPIRATORY_TRACT | Status: DC | PRN

## 2023-09-30 MED ORDER — ONDANSETRON HCL 4 MG PO TABS: 4.0000 mg | ORAL_TABLET | Freq: Four times a day (QID) | ORAL | Status: DC | PRN

## 2023-09-30 MED ORDER — SPIRONOLACTONE 25 MG PO TABS
25.0000 mg | ORAL_TABLET | Freq: Every day | ORAL | Status: DC
  Administered 2023-09-30 – 2023-10-04 (×5): 25 mg via ORAL
  Filled 2023-09-30 (×5): qty 1

## 2023-09-30 MED ORDER — RIVAROXABAN 20 MG PO TABS: 20.0000 mg | ORAL_TABLET | Freq: Every day | ORAL | Status: DC

## 2023-09-30 MED ORDER — HYDROCODONE-ACETAMINOPHEN 5-325 MG PO TABS: 1.0000 | ORAL_TABLET | ORAL | Status: DC | PRN

## 2023-09-30 MED ORDER — PANTOPRAZOLE SODIUM 40 MG PO TBEC
40.0000 mg | DELAYED_RELEASE_TABLET | Freq: Every day | ORAL | Status: DC
  Administered 2023-10-01 – 2023-10-04 (×4): 40 mg via ORAL
  Filled 2023-09-30 (×4): qty 1

## 2023-09-30 MED ORDER — ALBUMIN HUMAN 25 % IV SOLN
12.5000 g | Freq: Once | INTRAVENOUS | Status: AC
  Administered 2023-09-30: 12.5 g via INTRAVENOUS
  Filled 2023-09-30: qty 50

## 2023-09-30 MED ORDER — POTASSIUM CHLORIDE 10 MEQ/100ML IV SOLN
10.0000 meq | INTRAVENOUS | Status: AC
  Administered 2023-09-30 (×2): 10 meq via INTRAVENOUS
  Filled 2023-09-30 (×2): qty 100

## 2023-09-30 MED ORDER — FUROSEMIDE 10 MG/ML IJ SOLN
40.0000 mg | Freq: Once | INTRAMUSCULAR | Status: AC
  Administered 2023-09-30: 40 mg via INTRAVENOUS
  Filled 2023-09-30: qty 4

## 2023-09-30 NOTE — Plan of Care (Addendum)
 [facility] to [facility] transfer cardiac telemetry unit for observation:  68 year old man past medical history of CAD, HFpEF, OSA not on CPAP, paroxysmal atrial fibrillation, essential hypertension, hyperlipidemia, bilateral carotid stenosis, TIA and morbid obesity presented to emergency department complaining of shortness of breath on exertion and referred from heart failure  clinic for fluid overload and dyspnea.  At presentation to ED patient is hemodynamically stable except blood pressure borderline soft 95/79 which has been improved.  CTA chest evidence ruled out PE.  Multiple pulmonary nodule.  No focal abnormality.  Aortic atherosclerosis. Chest x-ray no active disease process.  Troponin x 2 within normal range.  Elevated proBNP 963.  CBC unremarkable.  BMP showing creatinine 1.31 GFR 59.  Renal function at baseline.  Dr. Nettie consulted cardiology Dr. Floretta recommended admit patient to [facility] and will see patient for consult in the daytime.  Hospitalist has been consulted for further evaluation management up acute on chronic CHF exacerbation.   [facility] will assume care on arrival to accepting facility. Until arrival, care as per EDP. However, [facility] available 24/7 for questions and assistance. Check [email or web address] for on-call coverage. Nursing staff, please call [facility] Admits & Consults System-Wide number under Amion on patient's arrival so appropriate admitting provider can evaluate the pt.      Author: Jaxon Mynhier, MD  09/30/2023  [facility]

## 2023-09-30 NOTE — ED Notes (Signed)
 Pt states he is not as SHOB at the present time.  Pt is aware he will be admitted to hospital.

## 2023-09-30 NOTE — ED Notes (Signed)
 Report given to [facility] RN

## 2023-09-30 NOTE — Subjective & Objective (Addendum)
 Patient was at CHF clinic today for routine appointment was told that he has fluid overload instructed to go to emergency department he has been having intermittent shortness of breath and orthopnea no nausea no vomiting no chest pain no fevers has been having abdominal distention leg edema  Known history of diastolic CHF and paroxysmal atrial fibrillation today morbid obesity Initially on arrival blood pressure was a little soft 95/79 but improved CTA negative for PE but does show multiple pulmonary nodules troponin unremarkable BNP 963 renal function at baseline Cardiology was consulted Case discussed with Dr. Floretta who recommends admission to [facility] to the medicine service with cardiology consult

## 2023-09-30 NOTE — Assessment & Plan Note (Signed)
-   will replace electrolytes and repeat  check Mg, phos and Ca level and replace as needed Monitor on telemetry   Lab Results  Component Value Date   K 3.1 (L) 09/30/2023     Lab Results  Component Value Date   CREATININE 1.29 (H) 09/30/2023   Lab Results  Component Value Date   MG 2.1 09/29/2023   Lab Results  Component Value Date   CALCIUM  9.2 09/30/2023

## 2023-09-30 NOTE — Consult Note (Addendum)
 Cardiology Consultation   Patient ID: TARAN HAYNESWORTH MRN: [id number]; DOB: 02/23/1956  Admit date: 09/30/2023 Date of Consult: 09/30/2023  PCP:  Sheldon Netter, PA   [facility] Providers Cardiologist:  Madonna Large, DO  Electrophysiologist:  OLE ONEIDA HOLTS, MD       Patient Profile: Edward Weaver is a 68 y.o. male with a hx of chronic diastolic heart failure, permanent atrial fibrillation, moderate nonobstructive CAD , hypertension, morbid obesity, obstructive sleep apnea not yet on CPAP, prediabetes, history of TIA a, asymptomatic bilateral carotid artery stenosis who is being seen 09/30/2023 for the evaluation of acute heart failure exacerbation at the request of Dr. Silvester.  History of Present Illness: Mr. Lapaglia has been evaluated in the heart failure clinic over the last several weeks with attempts to treat his fluid overload with ambulatory medications.  He was offered Furoscix  patches but declined.  To treatment with metolazone  on top of loop diuretics offered only modest and temporary improvement.  Last fall he weighed around 280-283 pounds, his weight peaked at 300 pounds a few days ago.  After treatment with metolazone  his weight was down to 288 pounds, but then rapidly increased back to 296 pounds.  He has orthopnea and PND.  He becomes short of breath walking just a few steps on level ground.  However, currently at rest he feels relatively comfortable.  He has significant edema of the lower extremities and abdominal distention.  He has not had chest pain either at rest or with activity.  He does have early satiety and abdominal distention.  He has not been aware of palpitations.  He has not had any falls, serious injuries or bleeding problems and is on chronic anticoagulation for permanent atrial fibrillation.  He has demonstrated poor compliance with sodium dietary restriction (eats a lot of fast food), drinks more than the recommend amount of fluids a day and has  been noncompliant with medications.  Since receiving intravenous medications in the emergency room he has filled approximately 1 urinal.  He has noted slight improvement in shortness of breath.  He has been diagnosed with obstructive sleep apnea based on home sleep study, but it sounds like he has not yet undergone CPAP titration trial and he has not yet received CPAP equipment.  He did have a split-night study in 2018 but did not start treatment at that time.  An outpatient echocardiogram has been ordered but not yet performed.  His most recent echocardiogram was performed in May 2023 and showed normal left ventricular size, moderate LVH borderline LV systolic function with EF 50-55%.  There was mild to moderate mitral insufficiency and mild to moderate tricuspid insufficiency.  PA pressure was not calculated.  Cardiac PET performed 07/15/2022 showed a normal pattern of perfusion and normal global myocardial flow reserve at 2.12.  LVEF at rest was calculated 47% but likely to be inaccurate in the setting of atrial fibrillation.  He underwent a coronary calcium  score (214), that placed him in the 66 percentile for age and gender in the past.   Past Medical History:  Diagnosis Date   Arthritis    Atrial fibrillation (HCC)    Bell's palsy    Carotid artery stenosis, asymptomatic, bilateral    Cerebrovascular disease    Chronic diastolic (congestive) heart failure (HCC)    Hiatal hernia    Hyperlipidemia    Hypertension    Neuropathy    OSA (obstructive sleep apnea)    Prediabetes    TIA (transient  ischemic attack)    Nov and/or Dec 2014    Past Surgical History:  Procedure Laterality Date   ESOPHAGUS SURGERY     KNEE SURGERY Right    1974   TONSILLECTOMY     WISDOM TOOTH EXTRACTION         Scheduled Meds:  atorvastatin   40 mg Oral QHS   [START ON 10/01/2023] pantoprazole   40 mg Oral Q1200   rivaroxaban   20 mg Oral Q supper   sodium chloride  flush  3 mL Intravenous Q12H    Continuous Infusions:  sodium chloride      PRN Meds: sodium chloride , acetaminophen  **OR** acetaminophen , albuterol , HYDROcodone -acetaminophen , ondansetron  **OR** ondansetron  (ZOFRAN ) IV, sodium chloride  flush  Allergies:    Allergies  Allergen Reactions   Penicillins Other (See Comments)    Syncope, hypotension    Social History:   Social History   Socioeconomic History   Marital status: Married    Spouse name: Heron   Number of children: 3   Years of education: Not on file   Highest education level: Not on file  Occupational History   Not on file  Tobacco Use   Smoking status: Never   Smokeless tobacco: Never  Vaping Use   Vaping status: Never Used  Substance and Sexual Activity   Alcohol  use: No   Drug use: No   Sexual activity: Yes    Birth control/protection: None  Other Topics Concern   Not on file  Social History Narrative   Not on file   Social Drivers of Health   Financial Resource Strain: Medium Risk (08/02/2023)   Overall Financial Resource Strain (CARDIA)    Difficulty of Paying Living Expenses: Somewhat hard  Food Insecurity: Food Insecurity Present (08/02/2023)   Hunger Vital Sign    Worried About Running Out of Food in the Last Year: Sometimes true    Ran Out of Food in the Last Year: Sometimes true  Transportation Needs: No Transportation Needs (08/02/2023)   PRAPARE - Administrator, Civil Service (Medical): No    Lack of Transportation (Non-Medical): No  Physical Activity: Not on file  Stress: Not on file  Social Connections: Not on file  Intimate Partner Violence: Not At Risk (07/06/2023)   Received from [facility]   Humiliation, Afraid, Rape, and Kick questionnaire    Within the last year, have you been afraid of your partner or ex-partner?: No    Within the last year, have you been humiliated or emotionally abused in other ways by your partner or ex-partner?: No    Within the last year, have you been kicked, hit,  slapped, or otherwise physically hurt by your partner or ex-partner?: No    Within the last year, have you been raped or forced to have any kind of sexual activity by your partner or ex-partner?: No    Family History:    Family History  Problem Relation Age of Onset   Hypertension Mother        Living   Cancer - Other Mother        Oral   Heart disease Mother    Hypertension Father    Alzheimer's disease Father        Living   Stroke Brother 41       Deceased   Healthy Brother        x1   Alzheimer's disease Paternal Aunt    Stroke Paternal Aunt        #1  Dementia Paternal Aunt        #2   Atrial fibrillation Son    Stroke Son    Hypertension Son      ROS:  Please see the history of present illness.   All other ROS reviewed and negative.     Physical Exam/Data: Vitals:   09/30/23 1439 09/30/23 1515 09/30/23 1716 09/30/23 1935  BP:   (!) 143/85 120/61  Pulse: 71 (!) 59 63 84  Resp: 19 19 20 17   Temp:   98 F (36.7 C) 98.2 F (36.8 C)  TempSrc:   Oral Oral  SpO2: 98% 96%  94%  Weight:      Height:        Intake/Output Summary (Last 24 hours) at 09/30/2023 2010 Last data filed at 09/30/2023 1935 Gross per 24 hour  Intake 290 ml  Output 800 ml  Net -510 ml      09/30/2023   12:07 AM 09/29/2023    2:45 PM 09/14/2023    2:46 PM  Last 3 Weights  Weight (lbs) 294 lb 296 lb 296 lb  Weight (kg) 133.358 kg 134.265 kg 134.265 kg     Body mass index is 47.45 kg/m.  General:  Well nourished, well developed, in no acute distress; morbidly obese HEENT: normal Neck: Difficult to evaluate JVD due to body habitus, but appear to be elevated to the angle of the jaw Vascular: No carotid bruits; Distal pulses 2+ bilaterally Cardiac:  normal S1, S2; irregularly irregular rhythm; no murmur  Lungs:  clear to auscultation bilaterally, no wheezing, rhonchi or rales  Abd: soft, nontender, no hepatomegaly.  Abdominal distention is noted.  Bowel sounds are normal. Ext: 2+ soft  pitting edema to the knees bilaterally Musculoskeletal:  No deformities, BUE and BLE strength normal and equal Skin: warm and dry  Neuro:  CNs 2-12 intact, no focal abnormalities noted Psych:  Normal affect   EKG:  The EKG was personally reviewed and demonstrates: Atrial fibrillation with good ventricular rate control Telemetry:  Telemetry was personally reviewed and demonstrates: Atrial fibrillation, rate controlled  Relevant CV Studies: Reviewed echocardiogram from May 2023, PET study from May 2024 and sleep study from 2018  Laboratory Data: High Sensitivity Troponin:  No results for input(s): TROPONINIHS in the last 720 hours.   Chemistry Recent Labs  Lab 09/29/23 1604 09/30/23 0017 09/30/23 0644  NA 137 137 138  K 3.5 3.6 3.1*  CL 96* 95* 95*  CO2 30 30 32  GLUCOSE 111* 110* 126*  BUN 22 24* 23  CREATININE 1.26* 1.31* 1.29*  CALCIUM  9.3 9.4 9.2  MG 2.1  --   --   GFRNONAA >60 59* >60  ANIONGAP 11 12 11     No results for input(s): PROT, ALBUMIN , AST, ALT, ALKPHOS, BILITOT in the last 168 hours. Lipids No results for input(s): CHOL, TRIG, HDL, LABVLDL, LDLCALC, CHOLHDL in the last 168 hours.  Hematology Recent Labs  Lab 09/30/23 0017  WBC 7.3  RBC 4.36  HGB 12.9*  HCT 38.2*  MCV 87.6  MCH 29.6  MCHC 33.8  RDW 12.7  PLT 292   Thyroid  No results for input(s): TSH, FREET4 in the last 168 hours.  BNP Recent Labs  Lab 09/29/23 1604 09/30/23 0017  BNP 143.9*  --   PROBNP  --  963.0*    DDimer No results for input(s): DDIMER in the last 168 hours.  Radiology/Studies:  CT Angio Chest PE W and/or Wo Contrast Result Date: 09/30/2023 CLINICAL  DATA:  Shortness of breath EXAM: CT ANGIOGRAPHY CHEST WITH CONTRAST TECHNIQUE: Multidetector CT imaging of the chest was performed using the standard protocol during bolus administration of intravenous contrast. Multiplanar CT image reconstructions and MIPs were obtained to evaluate the vascular  anatomy. RADIATION DOSE REDUCTION: This exam was performed according to the departmental dose-optimization program which includes automated exposure control, adjustment of the mA and/or kV according to patient size and/or use of iterative reconstruction technique. CONTRAST:  80mL OMNIPAQUE  IOHEXOL  350 MG/ML SOLN COMPARISON:  Chest x-ray from earlier in the same day, CT from 12/11/2013 FINDINGS: Cardiovascular: Atherosclerotic calcifications of the aorta are noted. No aneurysmal dilatation is seen. No cardiac enlargement is noted. The pulmonary artery shows a normal branching pattern bilaterally. No filling defect to suggest pulmonary embolism is noted. Mediastinum/Nodes: Thoracic inlet is within normal limits. No hilar or mediastinal adenopathy is noted. The esophagus as visualized is within normal limits. Moderate-sized hiatal hernia is again noted. Lungs/Pleura: Lungs are well aerated bilaterally. Scattered parenchymal nodules are noted throughout both lungs. The largest of these lies along the posteromedial aspect of the right lower lobe measuring 5 mm. No focal infiltrate or sizable effusion is seen. Upper Abdomen: No acute abnormality. Musculoskeletal: No chest wall abnormality. No acute or significant osseous findings. Review of the MIP images confirms the above findings. IMPRESSION: No evidence of pulmonary embolism. Multiple pulmonary nodules. Most significant: Right solid pulmonary nodule measuring 5 mm. Per Fleischner Society Guidelines, no routine follow-up imaging is recommended. These guidelines do not apply to immunocompromised patients and patients with cancer. Follow up in patients with significant comorbidities as clinically warranted. For lung cancer screening, adhere to Lung-RADS guidelines. Reference: Radiology. 2017; 284(1):228-43. No other focal abnormality is noted. Aortic Atherosclerosis (ICD10-I70.0). Electronically Signed   By: Oneil Devonshire M.D.   On: 09/30/2023 02:32   DG Chest 2  View Result Date: 09/30/2023 EXAM: 2 VIEW(S) XRAY OF THE CHEST 09/30/2023 12:22:00 AM COMPARISON: 10/03/2023 CLINICAL HISTORY: SOB. Endorses being at HF clinic today (with routine appointment) and was told he was fluid overloaded and to seek ED assessment if breathing became worse. Intermittent cough. SOB is worse with exertion. FINDINGS: LUNGS AND PLEURA: No focal pulmonary opacity. No pulmonary edema. No pleural effusion. No pneumothorax. HEART AND MEDIASTINUM: Stable cardiomediastinal silhouette. Aortic atherosclerotic calcification. BONES AND SOFT TISSUES: No acute osseous abnormality. IMPRESSION: 1. No acute process. Electronically signed by: Norman Gatlin MD 09/30/2023 12:27 AM EDT RP Workstation: [id number]     Assessment and Plan: HFpEF, acute on chronic: Has clear evidence of hypervolemia with biventricular manifestations.  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  Had a lengthy discussion regarding the importance of consistent lifelong dietary sodium restriction, judicious intake of fluids, daily weight monitoring, reviewed the signs and symptoms of heart failure exacerbation.  And particularly insisted on the importance of early intervention at the first signs of hypervolemia, when response to oral diuretics is more likely to be successful than when he has massive fluid overload.  Repeat echo has been ordered.  Continue intravenous diuretics and daily renal function and potassium level monitor.  Will shoot for a target dry weight of 280 pounds or lower,  if renal function allows.  Also started on spironolactone  to help with hypokalemia and heart failure, but gave  a dose of potassium supplementation as well, since his most recent  potassium was 3.1.  Labs this evening are pending. OSA: Labs with chronic metabolic alkalosis suggest that he has chronic hypoventilation.  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  It is unclear to me whether he truly has undergone a recent enough sleep study that we can use to initiate OP BiPAP therapy.  Treatment for OSA will be critical to prevent worsening right heart failure.  Due to his body habitus he will not derive benefit from an [place] device or from a dental device.  BiPAP will be the best treatment.  It is possible he will need some doses of acetazolamide if he develops excessive metabolic alkalosis.  Coronary calcification/aortic atherosclerosis: Unsurprisingly, these are present on his CT of the chest.  He does not have angina pectoris and did not have any evidence of coronary sufficiency on a recent cardiac PET scan.  The focus is on risk factor mitigation.  No plan for coronary workup during this hospitalization, unless there is a significant reduction in LV systolic function. AFib: Previous attempt at cardioversion recurrence of atrial fibrillation.  This is likely a permanent arrhythmia and I think it would be futile to attempt to return to normal rhythm unless his sleep apnea and weight are initially addressed.  Appropriately anticoagulated without bleeding complications. Morbid obesity.  Reportedly has been referred to pharmacy for GLP-1 agonists. PreDM/Hyperlipidemia: Most recent hemoglobin A1c 6.2% and LDL cholesterol 65.  Does have mild hypertriglyceridemia and chronically low HDL cholesterol.  Continue same medications.   Risk Assessment/Risk Scores:       New [place]  Heart Association (NYHA) Functional Class NYHA Class IV  CHA2DS2-VASc Score = 5   This indicates a 7.2% annual risk of stroke. The patient's score is based upon: CHF History: 1 HTN History: 0 Diabetes History: 0 Stroke History: 2 Vascular Disease History: 1 Age Score: 1 Gender Score:  0        For questions or updates, please contact [facility] Please consult [email or web address] for contact info under    Signed, Jerel Balding, MD  09/30/2023 8:10 PM

## 2023-09-30 NOTE — Assessment & Plan Note (Signed)
 Appreciate cardiology consult - Pt diagnosed with CHF based on presence of the following:  PND, OA,   bilateral leg edema, With noted response to IV diuretic in ER  admit on telemetry,  cycle cardiac enzymes, Cardiac Panel     obtain serial ECG  to evaluate for ischemia as a cause of heart failure  monitor daily weight:  Filed Weights   09/30/23 0007  Weight: 133.4 kg   Last BNP BNP (last 3 results) Recent Labs    08/18/23 1416 09/14/23 1547 09/29/23 1604  BNP 131.9* 198.4* 143.9*       diuresis as per cards and monitor orthostatics and creatinine to avoid over diuresis.  Order echogram to evaluate EF and valves  On Entresto    cardiology consulted

## 2023-09-30 NOTE — ED Triage Notes (Signed)
 Patient here POV from Home.  Endorses being at HF clinic today (with routine appointment) and was told he was fluid overloaded and to seek ED assessment if breathing became worse.   Intermittent cough. SOB is worse with exertion. No Pain. No N/V. No Fevers.  NAD Noted during Triage. A&Ox4. GCS 15. Ambulatory.

## 2023-09-30 NOTE — Assessment & Plan Note (Signed)
 Would benefit from follow-up with pulmonology as an outpatient

## 2023-09-30 NOTE — ED Notes (Signed)
 Patient transported to CT at this time.

## 2023-09-30 NOTE — Assessment & Plan Note (Addendum)
 Continue Xarelto  Would consider decrease dose of Toprol  given bradycardia defer to cardiology

## 2023-09-30 NOTE — ED Notes (Signed)
 Patient laying flat and while asleep SAT dropped to 72%. Patient stated he needs to have OSA study. Placed on 2L and SAT now 98%

## 2023-09-30 NOTE — Assessment & Plan Note (Signed)
Continue Lipitor 40 mg a day

## 2023-09-30 NOTE — ED Notes (Signed)
 Report called to Elora, RN at [facility].

## 2023-09-30 NOTE — ED Provider Notes (Signed)
 [facility] Provider Note   CSN: [id number] Arrival date & time: 09/30/23  0001     Patient presents with: Shortness of Breath   Edward Weaver is a 68 y.o. male.   The history is provided by the patient.  Shortness of Breath Severity:  Moderate Onset quality:  Sudden Duration:  1 day Timing:  Constant Progression:  Unchanged Chronicity:  New Context: not URI and not weather changes   Relieved by:  Nothing Worsened by:  Nothing Ineffective treatments:  Diuretics Associated symptoms: no fever, no vomiting and no wheezing   Associated symptoms comment:  Edema, reportedly told to come in by CHF clinic  Risk factors: no recent alcohol  use        Prior to Admission medications   Medication Sig Start Date End Date Taking? Authorizing Provider  albuterol  (VENTOLIN  HFA) 108 (90 Base) MCG/ACT inhaler Inhale 2 puffs into the lungs every 6 (six) hours as needed for wheezing or shortness of breath. 06/01/22   Fleeta Valeria Mayo, MD  atorvastatin  (LIPITOR) 40 MG tablet Take 1 tablet (40 mg total) by mouth at bedtime. 01/25/23 08/17/24  Tolia, Sunit, DO  bumetanide  (BUMEX ) 2 MG tablet Take 1 tablet (2 mg total) by mouth 2 (two) times daily. 09/29/23   Milford, Harlene HERO, FNP  gabapentin  (NEURONTIN ) 300 MG capsule Take 600 mg by mouth at bedtime.    [provider]  metolazone  (ZAROXOLYN ) 2.5 MG tablet Take 1 tablet (2.5 mg total) by mouth 3 (three) times a week. On Monday Wednesday friday 08/18/23 11/16/23  Hayes Beckey CROME, NP  metoprolol  succinate (TOPROL -XL) 50 MG 24 hr tablet Take 1 tablet (50 mg total) by mouth daily. Take with or immediately following a meal. 07/30/23 10/28/23  Cindie Ole DASEN, MD  potassium chloride  SA (KLOR-CON  M) 20 MEQ tablet Take 1 tablet (20 mEq total) by mouth daily. 09/29/23   Milford, Harlene HERO, FNP  rivaroxaban  (XARELTO ) 20 MG TABS tablet Take 1 tablet (20 mg total) by mouth daily with supper. 03/09/23   Tolia, Sunit,  DO  sacubitril -valsartan  (ENTRESTO ) 24-26 MG Take 1 tablet by mouth 2 (two) times daily. 07/30/23   Cindie Ole DASEN, MD    Allergies: Penicillins    Review of Systems  Constitutional:  Negative for fever.  HENT:  Negative for facial swelling.   Respiratory:  Positive for shortness of breath. Negative for wheezing.   Cardiovascular:  Positive for leg swelling.  Gastrointestinal:  Negative for vomiting.  All other systems reviewed and are negative.   Updated Vital Signs BP 118/89   Pulse (!) 46   Temp 98.3 F (36.8 C)   Resp 20   Ht 5' 6 (1.676 m)   Wt 133.4 kg   SpO2 93%   BMI 47.45 kg/m   Physical Exam Vitals and nursing note reviewed.  Constitutional:      General: He is not in acute distress.    Appearance: Normal appearance. He is well-developed. He is not diaphoretic.  HENT:     Head: Normocephalic and atraumatic.     Nose: Nose normal.  Eyes:     Conjunctiva/sclera: Conjunctivae normal.     Pupils: Pupils are equal, round, and reactive to light.  Cardiovascular:     Rate and Rhythm: Normal rate and regular rhythm.     Pulses: Normal pulses.     Heart sounds: Normal heart sounds.  Pulmonary:     Effort: Pulmonary effort is normal.  Breath sounds: Normal breath sounds. No wheezing or rales.  Abdominal:     General: Bowel sounds are normal.     Palpations: Abdomen is soft.     Tenderness: There is no abdominal tenderness. There is no guarding or rebound.  Musculoskeletal:        General: Normal range of motion.     Cervical back: Normal range of motion and neck supple.     Right lower leg: Edema present.     Left lower leg: Edema present.  Skin:    General: Skin is warm and dry.     Capillary Refill: Capillary refill takes less than 2 seconds.  Neurological:     General: No focal deficit present.     Mental Status: He is alert and oriented to person, place, and time.     Deep Tendon Reflexes: Reflexes normal.  Psychiatric:        Mood and Affect:  Mood normal.        Behavior: Behavior normal.     (all labs ordered are listed, but only abnormal results are displayed) Results for orders placed or performed during the hospital encounter of 09/30/23  Basic metabolic panel   Collection Time: 09/30/23 12:17 AM  Result Value Ref Range   Sodium 137 135 - 145 mmol/L   Potassium 3.6 3.5 - 5.1 mmol/L   Chloride 95 (L) 98 - 111 mmol/L   CO2 30 22 - 32 mmol/L   Glucose, Bld 110 (H) 70 - 99 mg/dL   BUN 24 (H) 8 - 23 mg/dL   Creatinine, Ser 8.68 (H) 0.61 - 1.24 mg/dL   Calcium  9.4 8.9 - 10.3 mg/dL   GFR, Estimated 59 (L) >60 mL/min   Anion gap 12 5 - 15  CBC   Collection Time: 09/30/23 12:17 AM  Result Value Ref Range   WBC 7.3 4.0 - 10.5 K/uL   RBC 4.36 4.22 - 5.81 MIL/uL   Hemoglobin 12.9 (L) 13.0 - 17.0 g/dL   HCT 61.7 (L) 60.9 - 47.9 %   MCV 87.6 80.0 - 100.0 fL   MCH 29.6 26.0 - 34.0 pg   MCHC 33.8 30.0 - 36.0 g/dL   RDW 87.2 88.4 - 84.4 %   Platelets 292 150 - 400 K/uL   nRBC 0.0 0.0 - 0.2 %  Pro Brain natriuretic peptide   Collection Time: 09/30/23 12:17 AM  Result Value Ref Range   Pro Brain Natriuretic Peptide 963.0 (H) <300.0 pg/mL  Troponin T, High Sensitivity   Collection Time: 09/30/23 12:17 AM  Result Value Ref Range   Troponin T High Sensitivity <15 <19 ng/L  Troponin T, High Sensitivity   Collection Time: 09/30/23  2:05 AM  Result Value Ref Range   Troponin T High Sensitivity <15 <19 ng/L   CT Angio Chest PE W and/or Wo Contrast Result Date: 09/30/2023 CLINICAL DATA:  Shortness of breath EXAM: CT ANGIOGRAPHY CHEST WITH CONTRAST TECHNIQUE: Multidetector CT imaging of the chest was performed using the standard protocol during bolus administration of intravenous contrast. Multiplanar CT image reconstructions and MIPs were obtained to evaluate the vascular anatomy. RADIATION DOSE REDUCTION: This exam was performed according to the departmental dose-optimization program which includes automated exposure control,  adjustment of the mA and/or kV according to patient size and/or use of iterative reconstruction technique. CONTRAST:  80mL OMNIPAQUE  IOHEXOL  350 MG/ML SOLN COMPARISON:  Chest x-ray from earlier in the same day, CT from 12/11/2013 FINDINGS: Cardiovascular: Atherosclerotic calcifications of the aorta are  noted. No aneurysmal dilatation is seen. No cardiac enlargement is noted. The pulmonary artery shows a normal branching pattern bilaterally. No filling defect to suggest pulmonary embolism is noted. Mediastinum/Nodes: Thoracic inlet is within normal limits. No hilar or mediastinal adenopathy is noted. The esophagus as visualized is within normal limits. Moderate-sized hiatal hernia is again noted. Lungs/Pleura: Lungs are well aerated bilaterally. Scattered parenchymal nodules are noted throughout both lungs. The largest of these lies along the posteromedial aspect of the right lower lobe measuring 5 mm. No focal infiltrate or sizable effusion is seen. Upper Abdomen: No acute abnormality. Musculoskeletal: No chest wall abnormality. No acute or significant osseous findings. Review of the MIP images confirms the above findings. IMPRESSION: No evidence of pulmonary embolism. Multiple pulmonary nodules. Most significant: Right solid pulmonary nodule measuring 5 mm. Per Fleischner Society Guidelines, no routine follow-up imaging is recommended. These guidelines do not apply to immunocompromised patients and patients with cancer. Follow up in patients with significant comorbidities as clinically warranted. For lung cancer screening, adhere to Lung-RADS guidelines. Reference: Radiology. 2017; 284(1):228-43. No other focal abnormality is noted. Aortic Atherosclerosis (ICD10-I70.0). Electronically Signed   By: Oneil Devonshire M.D.   On: 09/30/2023 02:32   DG Chest 2 View Result Date: 09/30/2023 EXAM: 2 VIEW(S) XRAY OF THE CHEST 09/30/2023 12:22:00 AM COMPARISON: 10/03/2023 CLINICAL HISTORY: SOB. Endorses being at HF clinic  today (with routine appointment) and was told he was fluid overloaded and to seek ED assessment if breathing became worse. Intermittent cough. SOB is worse with exertion. FINDINGS: LUNGS AND PLEURA: No focal pulmonary opacity. No pulmonary edema. No pleural effusion. No pneumothorax. HEART AND MEDIASTINUM: Stable cardiomediastinal silhouette. Aortic atherosclerotic calcification. BONES AND SOFT TISSUES: No acute osseous abnormality. IMPRESSION: 1. No acute process. Electronically signed by: Norman Gatlin MD 09/30/2023 12:27 AM EDT RP Workstation: [id number]     EKG: EKG Interpretation Date/Time:  Thursday September 30 2023 00:08:51 EDT Ventricular Rate:  74 PR Interval:    QRS Duration:  118 QT Interval:  427 QTC Calculation: 474 R Axis:   75  Text Interpretation: Atrial fibrillation Confirmed by Nettie, Tasheka Houseman ([id number]) on 09/30/2023 12:54:39 AM  Radiology: CT Angio Chest PE W and/or Wo Contrast Result Date: 09/30/2023 CLINICAL DATA:  Shortness of breath EXAM: CT ANGIOGRAPHY CHEST WITH CONTRAST TECHNIQUE: Multidetector CT imaging of the chest was performed using the standard protocol during bolus administration of intravenous contrast. Multiplanar CT image reconstructions and MIPs were obtained to evaluate the vascular anatomy. RADIATION DOSE REDUCTION: This exam was performed according to the departmental dose-optimization program which includes automated exposure control, adjustment of the mA and/or kV according to patient size and/or use of iterative reconstruction technique. CONTRAST:  80mL OMNIPAQUE  IOHEXOL  350 MG/ML SOLN COMPARISON:  Chest x-ray from earlier in the same day, CT from 12/11/2013 FINDINGS: Cardiovascular: Atherosclerotic calcifications of the aorta are noted. No aneurysmal dilatation is seen. No cardiac enlargement is noted. The pulmonary artery shows a normal branching pattern bilaterally. No filling defect to suggest pulmonary embolism is noted. Mediastinum/Nodes: Thoracic inlet is  within normal limits. No hilar or mediastinal adenopathy is noted. The esophagus as visualized is within normal limits. Moderate-sized hiatal hernia is again noted. Lungs/Pleura: Lungs are well aerated bilaterally. Scattered parenchymal nodules are noted throughout both lungs. The largest of these lies along the posteromedial aspect of the right lower lobe measuring 5 mm. No focal infiltrate or sizable effusion is seen. Upper Abdomen: No acute abnormality. Musculoskeletal: No chest wall abnormality. No acute or significant osseous  findings. Review of the MIP images confirms the above findings. IMPRESSION: No evidence of pulmonary embolism. Multiple pulmonary nodules. Most significant: Right solid pulmonary nodule measuring 5 mm. Per Fleischner Society Guidelines, no routine follow-up imaging is recommended. These guidelines do not apply to immunocompromised patients and patients with cancer. Follow up in patients with significant comorbidities as clinically warranted. For lung cancer screening, adhere to Lung-RADS guidelines. Reference: Radiology. 2017; 284(1):228-43. No other focal abnormality is noted. Aortic Atherosclerosis (ICD10-I70.0). Electronically Signed   By: Oneil Devonshire M.D.   On: 09/30/2023 02:32   DG Chest 2 View Result Date: 09/30/2023 EXAM: 2 VIEW(S) XRAY OF THE CHEST 09/30/2023 12:22:00 AM COMPARISON: 10/03/2023 CLINICAL HISTORY: SOB. Endorses being at HF clinic today (with routine appointment) and was told he was fluid overloaded and to seek ED assessment if breathing became worse. Intermittent cough. SOB is worse with exertion. FINDINGS: LUNGS AND PLEURA: No focal pulmonary opacity. No pulmonary edema. No pleural effusion. No pneumothorax. HEART AND MEDIASTINUM: Stable cardiomediastinal silhouette. Aortic atherosclerotic calcification. BONES AND SOFT TISSUES: No acute osseous abnormality. IMPRESSION: 1. No acute process. Electronically signed by: Norman Gatlin MD 09/30/2023 12:27 AM EDT RP  Workstation: [id number]     Procedures   Medications Ordered in the ED  iohexol  (OMNIPAQUE ) 350 MG/ML injection 80 mL (80 mLs Intravenous Contrast Given 09/30/23 0217)  furosemide  (LASIX ) injection 40 mg (40 mg Intravenous Given 09/30/23 0409)  albumin  human 25 % solution 12.5 g (0 g Intravenous Stopped 09/30/23 0455)                                    Medical Decision Making Patient with edema on diuretics with worsening swelling told to come to ED by CHF clinic   Amount and/or Complexity of Data Reviewed External Data Reviewed: notes.    Details: Previous notes reviewed  Labs: ordered.    Details: 2 negative troponins < 15, elevated BNP 963, normal white count normal hemoglobin 12.9, normal platelets.  Normal sodium 137, normal potassium 3.6, normal creatinine  Radiology: ordered and independent interpretation performed.    Details: Negative CXR by me  ECG/medicine tests: ordered and independent interpretation performed. Decision-making details documented in ED Course. Discussion of management or test interpretation with external provider(s): Case d/w Dr. Floretta of cardiology please admit to medicine cards will consult   Risk Prescription drug management. Decision regarding hospitalization.     Final diagnoses:  Acute right-sided congestive heart failure (HCC)   The patient appears reasonably stabilized for admission considering the current resources, flow, and capabilities available in the ED at this time, and I doubt any other [facility] requiring further screening and/or treatment in the ED prior to admission.  ED Discharge Orders     None          Rihana Kiddy, MD 09/30/23 [phone]

## 2023-09-30 NOTE — Assessment & Plan Note (Signed)
 No bleeding.  Obtain anemia panel

## 2023-09-30 NOTE — Assessment & Plan Note (Signed)
 Neurontin  can also increase lower extremity edema would consider alternatives

## 2023-09-30 NOTE — Assessment & Plan Note (Signed)
 Noted somewhat soft blood pressures initially need to be mindful of BP with diuresis

## 2023-09-30 NOTE — Assessment & Plan Note (Signed)
 Contributing to comorbidity and complicating medical management  Body mass index is 47.45 kg/m.  Nutritional follow up as an out pt would be recommended

## 2023-09-30 NOTE — Assessment & Plan Note (Signed)
Encourage compliance with CPAP. °

## 2023-09-30 NOTE — H&P (Addendum)
 Edward Weaver FMW:[id number] DOB: 01/30/1956 DOA: 09/30/2023     PCP: Sheldon Netter, PA   Outpatient Specialists:   CARDS:  Dr. Madonna Large, DO Dr. Cherrie  Patient arrived to ER on 09/30/23 at 0001 Referred by Attending Sundil, Subrina, MD   Patient coming from:    home Lives With family      Chief Complaint:   Chief Complaint  Patient presents with   Shortness of Breath    HPI: Edward Weaver is a 68 y.o. male with medical history significant of  moderate coronary artery calcification (214, 66th percentile), chronic HFpEF, OSA not CPAP, Atrial Fibrillation, TIA, HTN, HLD, bilateral carotid artery stenosis, and obesity.  CKD 3 A    Presented with worsening shortness of breath progressive lower extremity swelling Patient was at CHF clinic today for routine appointment was told that he has fluid overload instructed to go to emergency department he has been having intermittent shortness of breath and orthopnea no nausea no vomiting no chest pain no fevers has been having abdominal distention leg edema  Known history of diastolic CHF and paroxysmal atrial fibrillation today morbid obesity Initially on arrival blood pressure was a little soft 95/79 but improved CTA negative for PE but does show multiple pulmonary nodules troponin unremarkable BNP 963 renal function at baseline Cardiology was consulted Case discussed with Dr. Floretta who recommends admission to [facility] to the medicine service with cardiology consult Having hard time laying flat and increased dyspnea on exertion   PEr CHF note patient was supposed to start on Furoscix  + metolazone  2.5 mg + 60 KCL daily x 3 days he was told to hold Bumex  while he is on this regimen Patient and family did not feel comfortable so never started Furoscix  but also will continue to hold Bumex  Family felt like patient has been more fatigued and extra sleepy He has had somewhat decreased p.o. intake because of discomfort with  eating  He has been diagnosed with sleep apnea in the past but does not actually have a CPAP machine at home Patient endorses that he is more short of breath and ambulation  Wife endorses foul urine smell  Family states that while he did feel a little better after IV Lasix  in the emergency department and his legs edema has decreased somewhat his shortness of breath has not improved  Denies significant ETOH intake   Does not smoke       Regarding pertinent Chronic problems:    Hyperlipidemia -  on statins Lipitor (atorvastatin )  Lipid Panel     Component Value Date/Time   CHOL 156 08/18/2023 1416   CHOL 225 (H) 09/10/2020 1255   TRIG 247 (H) 08/18/2023 1416   HDL 42 08/18/2023 1416   HDL 46 09/10/2020 1255   CHOLHDL 3.7 08/18/2023 1416   VLDL 49 (H) 08/18/2023 1416   LDLCALC 65 08/18/2023 1416   LDLCALC 73 05/04/2022 1447   LDLDIRECT 86 05/04/2022 1447   LABVLDL 38 09/10/2020 1255    HTN on Toprol    chronic CHF diastolic/systolic- last echo  On metolazone  and Bumex  Entresto         preDM 2 -  Lab Results  Component Value Date   HGBA1C 6.2 (H) 05/06/2022   diet controlled    Morbid obesity-   BMI Readings from Last 1 Encounters:  09/30/23 47.45 kg/m         OSA - noncompliant with CPAP, family states does not have machine at home  A. Fib -   atrial fibrillation CHA2DS2 vas score  6   current  on anticoagulation with  Eliquis,     -  Rate control:  Currently controlled with  Toprolol,       CKD stage IIIa baseline Cr 1.2 Estimated Creatinine Clearance: 71 mL/min (A) (by C-G formula based on SCr of 1.29 mg/dL (H)).  Lab Results  Component Value Date   CREATININE 1.29 (H) 09/30/2023   CREATININE 1.31 (H) 09/30/2023   CREATININE 1.26 (H) 09/29/2023   Lab Results  Component Value Date   NA 138 09/30/2023   CL 95 (L) 09/30/2023   K 3.1 (L) 09/30/2023   CO2 32 09/30/2023   BUN 23 09/30/2023   CREATININE 1.29 (H) 09/30/2023   GFRNONAA >60  09/30/2023   CALCIUM  9.2 09/30/2023   ALBUMIN  3.6 08/03/2023   GLUCOSE 126 (H) 09/30/2023      While in ER:  Noted some bradycardia heart rate down to 46 soft blood pressures Peripheral edema Troponin negative x 2 He was given a dose of Lasix  as well as albumin   Lab Orders         Basic metabolic panel         CBC         Pro Brain natriuretic peptide         Basic metabolic panel       CXR -  NON acute    CTA chest -   no PE, Multiple pulmonary nodules. Most significant: Right solid pulmonary nodule measuring 5 mm  Following Medications were ordered in ER: Medications  iohexol  (OMNIPAQUE ) 350 MG/ML injection 80 mL (80 mLs Intravenous Contrast Given 09/30/23 0217)  furosemide  (LASIX ) injection 40 mg (40 mg Intravenous Given 09/30/23 0409)  albumin  human 25 % solution 12.5 g (0 g Intravenous Stopped 09/30/23 0455)    _______________________________________________________ ER Provider Called:    Cardiology   Dr. Floretta They Recommend admit to medicine   Will see on arrival     ED Triage Vitals  Encounter Vitals Group     BP 09/30/23 0009 126/79     Girls Systolic BP Percentile --      Girls Diastolic BP Percentile --      Boys Systolic BP Percentile --      Boys Diastolic BP Percentile --      Pulse Rate 09/30/23 0009 79     Resp 09/30/23 0009 16     Temp 09/30/23 0009 98 F (36.7 C)     Temp Source 09/30/23 0009 Oral     SpO2 09/30/23 0009 94 %     Weight 09/30/23 0007 294 lb (133.4 kg)     Height 09/30/23 0007 5' 6 (1.676 m)     Head Circumference --      Peak Flow --      Pain Score 09/30/23 0007 0     Pain Loc --      Pain Education --      Exclude from Growth Chart --   UFJK(75)@     _________________________________________ Significant initial  Findings: Abnormal Labs Reviewed  BASIC METABOLIC PANEL WITH GFR - Abnormal; Notable for the following components:      Result Value   Chloride 95 (*)    Glucose, Bld 110 (*)    BUN 24 (*)     Creatinine, Ser 1.31 (*)    GFR, Estimated 59 (*)    All other components within normal limits  CBC - Abnormal;  Notable for the following components:   Hemoglobin 12.9 (*)    HCT 38.2 (*)    All other components within normal limits  PRO BRAIN NATRIURETIC PEPTIDE - Abnormal; Notable for the following components:   Pro Brain Natriuretic Peptide 963.0 (*)    All other components within normal limits  BASIC METABOLIC PANEL WITH GFR - Abnormal; Notable for the following components:   Potassium 3.1 (*)    Chloride 95 (*)    Glucose, Bld 126 (*)    Creatinine, Ser 1.29 (*)    All other components within normal limits      _________________________ Troponin  T in the ER less than 15 Cardiac Panel (last 3 results) No results for input(s): CKTOTAL, CKMB, TROPONINIHS, RELINDX in the last 72 hours.   ECG: Ordered Personally reviewed and interpreted by me showing: HR : 74 Rhythm:  A.fib.   no evidence of ischemic changes QTC 474  BNP (last 3 results) Recent Labs    08/18/23 1416 09/14/23 1547 09/29/23 1604  BNP 131.9* 198.4* 143.9*    The recent clinical data is shown below. Vitals:   09/30/23 1430 09/30/23 1439 09/30/23 1515 09/30/23 1716  BP: 122/85   (!) 143/85  Pulse: (!) 49 71 (!) 59 63  Resp: 19 19 19 20   Temp:    98 F (36.7 C)  TempSrc:    Oral  SpO2: 97% 98% 96%   Weight:      Height:        WBC     Component Value Date/Time   WBC 7.3 09/30/2023 0017   LYMPHSABS 2.0 08/03/2023 2038   LYMPHSABS 2.0 04/24/2019 0942   MONOABS 0.5 08/03/2023 2038   EOSABS 0.4 08/03/2023 2038   EOSABS 0.4 04/24/2019 0942   BASOSABS 0.1 08/03/2023 2038   BASOSABS 0.1 04/24/2019 0942     Procalcitonin   Ordered      UA    ordered  ________________________________________________________________ VBG pending   __________________________________________________________ Recent Labs  Lab 09/29/23 1604 09/30/23 0017 09/30/23 0644  NA 137 137 138  K 3.5 3.6 3.1*   CO2 30 30 32  GLUCOSE 111* 110* 126*  BUN 22 24* 23  CREATININE 1.26* 1.31* 1.29*  CALCIUM  9.3 9.4 9.2  MG 2.1  --   --     Cr  stable,    Lab Results  Component Value Date   CREATININE 1.29 (H) 09/30/2023   CREATININE 1.31 (H) 09/30/2023   CREATININE 1.26 (H) 09/29/2023    No results for input(s): AST, ALT, ALKPHOS, BILITOT, PROT, ALBUMIN  in the last 168 hours. Lab Results  Component Value Date   CALCIUM  9.2 09/30/2023    Plt: Lab Results  Component Value Date   PLT 292 09/30/2023       Recent Labs  Lab 09/30/23 0017  WBC 7.3  HGB 12.9*  HCT 38.2*  MCV 87.6  PLT 292    HG/HCT   stable     Component Value Date/Time   HGB 12.9 (L) 09/30/2023 0017   HGB 13.8 01/12/2023 0933   HCT 38.2 (L) 09/30/2023 0017   HCT 43.2 01/12/2023 0933   MCV 87.6 09/30/2023 0017   MCV 86 04/24/2019 0942       _______________________________________________ Hospitalist was called for admission for   Acute right-sided congestive heart failure      The following Work up has been ordered so far:  Orders Placed This Encounter  Procedures   DG Chest 2 View  CT Angio Chest PE W and/or Wo Contrast   Basic metabolic panel   CBC   Pro Brain natriuretic peptide   Basic metabolic panel   Diet Heart Room service appropriate? Yes; Fluid consistency: Thin; Fluid restriction: 2000 mL Fluid   Document Height and Actual Weight   Cardiac Monitoring - Continuous Indefinite   Strict intake and output   Daily weights   Inpatient consult to Cardiology   Consult to hospitalist   EKG 12-Lead   EKG   EKG   ECHOCARDIOGRAM COMPLETE   Place in observation (patient's expected length of stay will be less than 2 midnights)     OTHER Significant initial  Findings:  labs showing:     DM  labs:  HbA1C: No results for input(s): HGBA1C in the last 8760 hours.     CBG (last 3)  No results for input(s): GLUCAP in the last 72 hours.        Cultures: No results found for:  SDES, SPECREQUEST, CULT, REPTSTATUS   Radiological Exams on Admission: CT Angio Chest PE W and/or Wo Contrast Result Date: 09/30/2023 CLINICAL DATA:  Shortness of breath EXAM: CT ANGIOGRAPHY CHEST WITH CONTRAST TECHNIQUE: Multidetector CT imaging of the chest was performed using the standard protocol during bolus administration of intravenous contrast. Multiplanar CT image reconstructions and MIPs were obtained to evaluate the vascular anatomy. RADIATION DOSE REDUCTION: This exam was performed according to the departmental dose-optimization program which includes automated exposure control, adjustment of the mA and/or kV according to patient size and/or use of iterative reconstruction technique. CONTRAST:  80mL OMNIPAQUE  IOHEXOL  350 MG/ML SOLN COMPARISON:  Chest x-ray from earlier in the same day, CT from 12/11/2013 FINDINGS: Cardiovascular: Atherosclerotic calcifications of the aorta are noted. No aneurysmal dilatation is seen. No cardiac enlargement is noted. The pulmonary artery shows a normal branching pattern bilaterally. No filling defect to suggest pulmonary embolism is noted. Mediastinum/Nodes: Thoracic inlet is within normal limits. No hilar or mediastinal adenopathy is noted. The esophagus as visualized is within normal limits. Moderate-sized hiatal hernia is again noted. Lungs/Pleura: Lungs are well aerated bilaterally. Scattered parenchymal nodules are noted throughout both lungs. The largest of these lies along the posteromedial aspect of the right lower lobe measuring 5 mm. No focal infiltrate or sizable effusion is seen. Upper Abdomen: No acute abnormality. Musculoskeletal: No chest wall abnormality. No acute or significant osseous findings. Review of the MIP images confirms the above findings. IMPRESSION: No evidence of pulmonary embolism. Multiple pulmonary nodules. Most significant: Right solid pulmonary nodule measuring 5 mm. Per Fleischner Society Guidelines, no routine follow-up  imaging is recommended. These guidelines do not apply to immunocompromised patients and patients with cancer. Follow up in patients with significant comorbidities as clinically warranted. For lung cancer screening, adhere to Lung-RADS guidelines. Reference: Radiology. 2017; 284(1):228-43. No other focal abnormality is noted. Aortic Atherosclerosis (ICD10-I70.0). Electronically Signed   By: Oneil Devonshire M.D.   On: 09/30/2023 02:32   DG Chest 2 View Result Date: 09/30/2023 EXAM: 2 VIEW(S) XRAY OF THE CHEST 09/30/2023 12:22:00 AM COMPARISON: 10/03/2023 CLINICAL HISTORY: SOB. Endorses being at HF clinic today (with routine appointment) and was told he was fluid overloaded and to seek ED assessment if breathing became worse. Intermittent cough. SOB is worse with exertion. FINDINGS: LUNGS AND PLEURA: No focal pulmonary opacity. No pulmonary edema. No pleural effusion. No pneumothorax. HEART AND MEDIASTINUM: Stable cardiomediastinal silhouette. Aortic atherosclerotic calcification. BONES AND SOFT TISSUES: No acute osseous abnormality. IMPRESSION: 1. No acute process. Electronically  signed by: Norman Gatlin MD 09/30/2023 12:27 AM EDT RP Workstation: [id number]   _______________________________________________________________________________________________________ Latest  Blood pressure (!) 143/85, pulse 63, temperature 98 F (36.7 C), temperature source Oral, resp. rate 20, height 5' 6 (1.676 m), weight 133.4 kg, SpO2 96%.   Vitals  labs and radiology finding personally reviewed  Review of Systems:    Pertinent positives include:  fatigue, Bilateral lower extremity swelling   Constitutional:  No weight loss, night sweats, Fevers, chills,  weight loss  HEENT:  No headaches, Difficulty swallowing,Tooth/dental problems,Sore throat,  No sneezing, itching, ear ache, nasal congestion, post nasal drip,  Cardio-vascular:  No chest pain, Orthopnea, PND, anasarca, dizziness, palpitations.no GI:  No heartburn,  indigestion, abdominal pain, nausea, vomiting, diarrhea, change in bowel habits, loss of appetite, melena, blood in stool, hematemesis Resp:  no shortness of breath at rest. No dyspnea on exertion, No excess mucus, no productive cough, No non-productive cough, No coughing up of blood.No change in color of mucus.No wheezing. Skin:  no rash or lesions. No jaundice GU:  no dysuria, change in color of urine, no urgency or frequency. No straining to urinate.  No flank pain.  Musculoskeletal:  No joint pain or no joint swelling. No decreased range of motion. No back pain.  Psych:  No change in mood or affect. No depression or anxiety. No memory loss.  Neuro: no localizing neurological complaints, no tingling, no weakness, no double vision, no gait abnormality, no slurred speech, no confusion  All systems reviewed and apart from HOPI all are negative _______________________________________________________________________________________________ Past Medical History:   Past Medical History:  Diagnosis Date   Arthritis    Atrial fibrillation (HCC)    Bell's palsy    Carotid artery stenosis, asymptomatic, bilateral    Cerebrovascular disease    Chronic diastolic (congestive) heart failure (HCC)    Hiatal hernia    Hyperlipidemia    Hypertension    Neuropathy    OSA (obstructive sleep apnea)    Prediabetes    TIA (transient ischemic attack)    Nov and/or Dec 2014    Past Surgical History:  Procedure Laterality Date   ESOPHAGUS SURGERY     KNEE SURGERY Right    1974   TONSILLECTOMY     WISDOM TOOTH EXTRACTION      Social History:  Ambulatory   independently    reports that he has never smoked. He has never used smokeless tobacco. He reports that he does not drink alcohol  and does not use drugs.   Family History:    Family History  Problem Relation Age of Onset   Hypertension Mother        Living   Cancer - Other Mother        Oral   Heart disease Mother    Hypertension  Father    Alzheimer's disease Father        Living   Stroke Brother 38       Deceased   Healthy Brother        x1   Alzheimer's disease Paternal Aunt    Stroke Paternal Aunt        #1   Dementia Paternal Aunt        #2   Atrial fibrillation Son    Stroke Son    Hypertension Son    ______________________________________________________________________________________________ Allergies: Allergies  Allergen Reactions   Penicillins Other (See Comments)    Syncope, hypotension     Prior to Admission medications   Medication Sig  Start Date End Date Taking? Authorizing Provider  albuterol  (VENTOLIN  HFA) 108 (90 Base) MCG/ACT inhaler Inhale 2 puffs into the lungs every 6 (six) hours as needed for wheezing or shortness of breath. 06/01/22   Fleeta Valeria Mayo, MD  atorvastatin  (LIPITOR) 40 MG tablet Take 1 tablet (40 mg total) by mouth at bedtime. 01/25/23 08/17/24  Tolia, Sunit, DO  bumetanide  (BUMEX ) 2 MG tablet Take 1 tablet (2 mg total) by mouth 2 (two) times daily. 09/29/23   Milford, Harlene HERO, FNP  gabapentin  (NEURONTIN ) 300 MG capsule Take 600 mg by mouth at bedtime.    [provider]  metolazone  (ZAROXOLYN ) 2.5 MG tablet Take 1 tablet (2.5 mg total) by mouth 3 (three) times a week. On Monday Wednesday friday 08/18/23 11/16/23  Hayes Beckey CROME, NP  metoprolol  succinate (TOPROL -XL) 50 MG 24 hr tablet Take 1 tablet (50 mg total) by mouth daily. Take with or immediately following a meal. 07/30/23 10/28/23  Cindie Ole DASEN, MD  potassium chloride  SA (KLOR-CON  M) 20 MEQ tablet Take 1 tablet (20 mEq total) by mouth daily. 09/29/23   Milford, Harlene HERO, FNP  rivaroxaban  (XARELTO ) 20 MG TABS tablet Take 1 tablet (20 mg total) by mouth daily with supper. 03/09/23   Tolia, Sunit, DO  sacubitril -valsartan  (ENTRESTO ) 24-26 MG Take 1 tablet by mouth 2 (two) times daily. 07/30/23   Cindie Ole DASEN, MD     ___________________________________________________________________________________________________ Physical Exam:    09/30/2023    5:16 PM 09/30/2023    3:15 PM 09/30/2023    2:39 PM  Vitals with BMI  Systolic 143    Diastolic 85    Pulse 63 59 71     1. General:  in No  Acute distress   Chronically ill -appearing 2. Psychological: Alert and   Oriented 3. Head/ENT:   Moist  Mucous Membranes                          Head Non traumatic, neck supple                          Normal  Dentition 4. SKIN: normal no skin turgor,  Skin clean Dry and intact no rash    5. Heart: Regular rate and rhythm no  Murmur, no Rub or gallop 6. Lungs:   no wheezes mild crackles   7. Abdomen: Soft,  non-tender,  distended   obese  bowel sounds present 8. Lower extremities: no clubbing, cyanosis, 2+ edema 9. Neurologically Grossly intact, moving all 4 extremities equally   10. MSK: Normal range of motion    Chart has been reviewed  ______________________________________________________________________________________________  Assessment/Plan 68 y.o. male with medical history significant of  moderate coronary artery calcification (214, 66th percentile), chronic HFpEF, OSA not CPAP, Atrial Fibrillation, TIA, HTN, HLD, bilateral carotid artery stenosis, and obesity.  CKD 3 A  Admitted for   Acute right-sided congestive heart failure      Present on Admission:  A-fib (HCC)  Dyslipidemia  Essential hypertension  Hypokalemia  OSA (obstructive sleep apnea)  Anemia  Pulmonary nodules     A-fib (HCC) Continue Xarelto  Would consider decrease dose of Toprol  given bradycardia defer to cardiology   Acute exacerbation of CHF (congestive heart failure) (HCC) Appreciate cardiology consult - Pt diagnosed with CHF based on presence of the following:  PND, OA,   bilateral leg edema, With noted response to IV diuretic in ER  admit on telemetry,  cycle cardiac enzymes, Cardiac Panel     obtain  serial ECG  to evaluate for ischemia as a cause of heart failure  monitor daily weight:  Filed Weights   09/30/23 0007  Weight: 133.4 kg   Last BNP BNP (last 3 results) Recent Labs    08/18/23 1416 09/14/23 1547 09/29/23 1604  BNP 131.9* 198.4* 143.9*       diuresis as per cards and monitor orthostatics and creatinine to avoid over diuresis.  Order echogram to evaluate EF and valves  On Entresto    cardiology consulted    BMI 45.0-49.9, adult (HCC) Contributing to comorbidity and complicating medical management  Body mass index is 47.45 kg/m.  Nutritional follow up as an out pt would be recommended   Dyslipidemia Continue Lipitor 40 mg a day  Essential hypertension Noted somewhat soft blood pressures initially need to be mindful of BP with diuresis  Neuropathy Neurontin  can also increase lower extremity edema would consider alternatives  Hypokalemia - will replace electrolytes and repeat  check Mg, phos and Ca level and replace as needed Monitor on telemetry   Lab Results  Component Value Date   K 3.1 (L) 09/30/2023     Lab Results  Component Value Date   CREATININE 1.29 (H) 09/30/2023   Lab Results  Component Value Date   MG 2.1 09/29/2023   Lab Results  Component Value Date   CALCIUM  9.2 09/30/2023     OSA (obstructive sleep apnea) Encourage compliance with CPAP  Anemia No bleeding.  Obtain anemia panel  Pulmonary nodules Would benefit from follow-up with pulmonology as an outpatient   Other plan as per orders.  DVT prophylaxis:  xarelto     Code Status:    Code Status: Prior FULL CODE   as per patient    I had personally discussed CODE STATUS with patient   ACP   none    Family Communication:   Family   at  Bedside  plan of care was discussed    with   Son, Daughter, Wife,   Diet  Diet Orders (From admission, onward)     Start     Ordered   09/30/23 0321  Diet Heart Room service appropriate? Yes; Fluid consistency: Thin; Fluid  restriction: 2000 mL Fluid  Diet effective now       Comments: Salt restriction 2 g/day  Question Answer Comment  Room service appropriate? Yes   Fluid consistency: Thin   Fluid restriction: 2000 mL Fluid      09/30/23 0321            Disposition Plan:       To home once workup is complete and patient is stable   Following barriers for discharge:                                                      Electrolytes corrected                               Anemia stable  Will need consultants to evaluate patient prior to discharge                           Work of breathing improves       Consult Orders  (From admission, onward)           Start     Ordered   09/30/23 0249  Consult to hospitalist  Called [facility] at 0300 spoke with Tammi  Once       Provider:  (Not yet assigned)  Question Answer Comment  Place call to: [facility]   Reason for Consult Admit      09/30/23 0248                               Would benefit from PT/OT eval prior to DC  Ordered                      Consults called: Cardiology    Admission status:  ED Disposition     ED Disposition  Admit   Condition  --   Comment  Hospital [facility] [[id number]]  Level of Care: Telemetry Cardiac [[id number]]  I expect the patient will be discharged within 24 hours: No (not a candidate for [facility]2W observation unit)  May place patient in observation at [facility] or [facility] if equivalent level of care is available:: No  Interfacility transfer: Yes  Covid Evaluation: Asymptomatic - no recent exposure (last 10 days) testing not required  Diagnosis: Acute exacerbation of CHF (congestive heart failure) [facility]) [[id number]]  Admitting Physician: SUNDIL, SUBRINA [[id number]]  Attending Physician: SUNDIL, SUBRINA [phone]  For patients discharging to extended facilities (i.e. SNF, AL, group homes or LTAC) initiate:: Discharge to  SNF/Facility Placement COVID-19 Lab Testing Protocol           Obs      Level of care     tele  For 12H    Koraima Albertsen 09/30/2023, 7:48 PM    [facility]     after 2 AM please page floor coverage   If 7AM-7PM, please contact the day team taking care of the patient using [email or web address]

## 2023-09-30 NOTE — ED Notes (Signed)
Report given to [facility]  

## 2023-09-30 NOTE — ED Notes (Signed)
 Oatmeal, cracker and peanut butter

## 2023-09-30 NOTE — ED Notes (Signed)
 Called [facility] for transport to [facility]. @14 :56.  Spoke with [employer]

## 2023-10-01 ENCOUNTER — Observation Stay (HOSPITAL_COMMUNITY)

## 2023-10-01 ENCOUNTER — Other Ambulatory Visit (HOSPITAL_COMMUNITY): Payer: Self-pay

## 2023-10-01 ENCOUNTER — Telehealth (HOSPITAL_COMMUNITY): Payer: Self-pay | Admitting: Pharmacy Technician

## 2023-10-01 DIAGNOSIS — I5031 Acute diastolic (congestive) heart failure: Secondary | ICD-10-CM

## 2023-10-01 DIAGNOSIS — I5033 Acute on chronic diastolic (congestive) heart failure: Secondary | ICD-10-CM | POA: Diagnosis not present

## 2023-10-01 LAB — CBC
HCT: 38.6 % — ABNORMAL LOW (ref 39.0–52.0)
Hemoglobin: 13 g/dL (ref 13.0–17.0)
MCH: 29.5 pg (ref 26.0–34.0)
MCHC: 33.7 g/dL (ref 30.0–36.0)
MCV: 87.7 fL (ref 80.0–100.0)
Platelets: 292 K/uL (ref 150–400)
RBC: 4.4 MIL/uL (ref 4.22–5.81)
RDW: 12.5 % (ref 11.5–15.5)
WBC: 6.2 K/uL (ref 4.0–10.5)
nRBC: 0 % (ref 0.0–0.2)

## 2023-10-01 LAB — PHOSPHORUS: Phosphorus: 3.8 mg/dL (ref 2.5–4.6)

## 2023-10-01 LAB — COMPREHENSIVE METABOLIC PANEL WITH GFR
ALT: 28 U/L (ref 0–44)
AST: 19 U/L (ref 15–41)
Albumin: 3.5 g/dL (ref 3.5–5.0)
Alkaline Phosphatase: 66 U/L (ref 38–126)
Anion gap: 13 (ref 5–15)
BUN: 24 mg/dL — ABNORMAL HIGH (ref 8–23)
CO2: 31 mmol/L (ref 22–32)
Calcium: 8.9 mg/dL (ref 8.9–10.3)
Chloride: 92 mmol/L — ABNORMAL LOW (ref 98–111)
Creatinine, Ser: 1.52 mg/dL — ABNORMAL HIGH (ref 0.61–1.24)
GFR, Estimated: 50 mL/min — ABNORMAL LOW (ref >60)
Glucose, Bld: 129 mg/dL — ABNORMAL HIGH (ref 70–99)
Potassium: 3.1 mmol/L — ABNORMAL LOW (ref 3.5–5.1)
Sodium: 136 mmol/L (ref 135–145)
Total Bilirubin: 1 mg/dL (ref 0.0–1.2)
Total Protein: 7.4 g/dL (ref 6.5–8.1)

## 2023-10-01 LAB — ECHOCARDIOGRAM COMPLETE
Height: 66 in
S' Lateral: 2.5 cm
Weight: 4571.2 [oz_av]

## 2023-10-01 LAB — MAGNESIUM: Magnesium: 2.1 mg/dL (ref 1.7–2.4)

## 2023-10-01 MED ORDER — POTASSIUM CHLORIDE CRYS ER 20 MEQ PO TBCR
40.0000 meq | EXTENDED_RELEASE_TABLET | Freq: Once | ORAL | Status: AC
  Administered 2023-10-01: 40 meq via ORAL
  Filled 2023-10-01: qty 2

## 2023-10-01 MED ORDER — EMPAGLIFLOZIN 10 MG PO TABS
10.0000 mg | ORAL_TABLET | Freq: Every day | ORAL | Status: DC
  Administered 2023-10-01 – 2023-10-04 (×4): 10 mg via ORAL
  Filled 2023-10-01 (×4): qty 1

## 2023-10-01 NOTE — Progress Notes (Signed)
 OT Cancellation Note  Patient Details Name: Edward Weaver MRN: [id number] DOB: 02/29/1956   Cancelled Treatment:    Reason Eval/Treat Not Completed: OT screened, no needs identified, will sign off. Pt observed ambulating in hallway and completing stairs with PT, no AD. Pt reporting no concerns with self-care. OT is signing off on this pt.  Lindsey Hommel C, OT  Acute Rehabilitation Services Office [phone] Secure chat preferred   Adrianne GORMAN Savers 10/01/2023, 10:26 AM

## 2023-10-01 NOTE — Progress Notes (Signed)
  Progress Note  Patient Name: Edward Weaver Date of Encounter: 10/01/2023 [facility] Cardiologist: Madonna Large, DO   Interval Summary   Breathing mildly improved.  According to history, he was offered Furoscix  but declined.  Treatment with metolazone  on top of loop diuretics only offered temporary improvement.  Wife in room.  Discussed.  Vital Signs Vitals:   10/01/23 0005 10/01/23 0120 10/01/23 0545 10/01/23 0802  BP: 129/76  (!) 129/91 113/86  Pulse: 67 68 68 76  Resp: 18 16 18 16   Temp: 98.7 F (37.1 C)  98.1 F (36.7 C) 97.7 F (36.5 C)  TempSrc: Oral  Oral Oral  SpO2: 95% 92% 98% 98%  Weight:   129.6 kg   Height:        Intake/Output Summary (Last 24 hours) at 10/01/2023 1023 Last data filed at 10/01/2023 0904 Gross per 24 hour  Intake 446.09 ml  Output 1550 ml  Net -1103.91 ml      10/01/2023    5:45 AM 09/30/2023   12:07 AM 09/29/2023    2:45 PM  Last 3 Weights  Weight (lbs) 285 lb 11.2 oz 294 lb 296 lb  Weight (kg) 129.593 kg 133.358 kg 134.265 kg      Telemetry/ECG  No adverse arrhythmias- Personally Reviewed  Physical Exam  GEN: No acute distress.   Neck: No JVD Cardiac: RRR, no murmurs, rubs, or gallops.  Respiratory: Clear to auscultation bilaterally. GI: Soft, nontender, non-distended  MS: 2-3+ edema  Assessment & Plan   Acute on chronic diastolic heart failure - Combination of morbid obesity, BMI 46, OSA with chronic metabolic acidosis noted on chemistry panel suggesting chronic hypoventilation.  I think it makes sense for us  to continue with IV diuresis, creatinine 1.52 today down from 1.56.  On admission 1.29.  Hypokalemia - Potassium 3.1, yesterday 2.8.  Continue with aggressive repletion.  Atrial fibrillation likely permanent at this point - Would not be a good candidate for cardioversion.  Has had attempts in the past but always returns to atrial fibrillation.  Appropriately anticoagulated.  Morbid obesity - GLP-1  referral in place.    For questions or updates, please contact [facility] Please consult [email or web address] for contact info under       Signed, Oneil Parchment, MD

## 2023-10-01 NOTE — Care Management Obs Status (Signed)
 MEDICARE OBSERVATION STATUS NOTIFICATION   Patient Details  Name: Edward Weaver MRN: [id number] Date of Birth: Feb 09, 1956   Medicare Observation Status Notification Given:  Yes    Waddell Barnie Rama, RN 10/01/2023, 11:26 AM

## 2023-10-01 NOTE — Progress Notes (Signed)
 Heart Failure Navigator Progress Note  Assessed for Heart & Vascular TOC clinic readiness.  Patient does not meet criteria due to Advanced heart Failure team patient of Dr. Bensimhon. .   Navigator will sign off at this time.   Randie Bustle, BSN, Scientist, clinical (histocompatibility and immunogenetics) Only

## 2023-10-01 NOTE — Plan of Care (Signed)
  Problem: Clinical Measurements: Goal: Ability to maintain clinical measurements within normal limits will improve Outcome: Progressing Goal: Respiratory complications will improve Outcome: Progressing Goal: Cardiovascular complication will be avoided Outcome: Progressing   Problem: Education: Goal: Ability to demonstrate management of disease process will improve Outcome: Progressing   Problem: Activity: Goal: Capacity to carry out activities will improve Outcome: Progressing   Problem: Cardiac: Goal: Ability to achieve and maintain adequate cardiopulmonary perfusion will improve Outcome: Progressing

## 2023-10-01 NOTE — Evaluation (Signed)
 Physical Therapy Brief Evaluation and Discharge Note Patient Details Name: Edward Weaver MRN: [id number] DOB: 02/27/1956 Today's Date: 10/01/2023   History of Present Illness  68 yo male admitted 09/30/23 with SOB, LB edema and acute CHF exacerbation. PMhx: anxiety, AAA, CHF, HLD, carotid artery stenosis, obesity, Afib, neuropathy, HTN, CKD, OSA  Clinical Impression  Pt pleasant and able to perform all mobility without assist with SPO2 90-95%. Pt educated for low salt diet, weighing daily, walking program and encouraged purchase of home pulse oximeter to monitor SPO2. Pt at baseline functional level without further needs, will sign off.        PT Assessment Patient does not need any further PT services  Assistance Needed at Discharge  None    Equipment Recommendations None recommended by PT  Recommendations for Other Services       Precautions/Restrictions Precautions Precautions: None        Mobility  Bed Mobility   Supine/Sidelying to sit: Independent      Transfers Overall transfer level: Independent                      Ambulation/Gait Ambulation/Gait assistance: Independent Gait Distance (Feet): 500 Feet Assistive device: None Gait Pattern/deviations: WFL(Within Functional Limits) Gait Speed: Pace WFL    Home Activity Instructions    Stairs Stairs: Yes Stairs assistance: Modified independent (Device/Increase time) Stair Management: One rail Right Number of Stairs: 3 General stair comments: pt able to complete 3 stairs with rail without LOB and SPO2 90%  Modified Rankin (Stroke Patients Only)        Balance Overall balance assessment: Independent, No apparent balance deficits (not formally assessed)                        Pertinent Vitals/Pain PT - Brief Vital Signs All Vital Signs Stable: Yes Pain Assessment Pain Assessment: No/denies pain     Home Living Family/patient expects to be discharged to:: Private  residence Living Arrangements: Spouse/significant other Available Help at Discharge: Family Home Environment: Ramped entrance   Home Equipment: None        Prior Function Level of Independence: Independent      UE/LE Assessment   UE ROM/Strength/Tone/Coordination: WFL    LE ROM/Strength/Tone/Coordination: [facility]      Communication   Communication Communication: No apparent difficulties     Cognition Overall Cognitive Status: Appears within functional limits for tasks assessed/performed       General Comments      Exercises     Assessment/Plan    PT Problem List         PT Visit Diagnosis Other abnormalities of gait and mobility (R26.89)    No Skilled PT All education completed;Patient is independent with all acitivity/mobility   Co-evaluation                AMPAC 6 Clicks Help needed turning from your back to your side while in a flat bed without using bedrails?: None Help needed moving from lying on your back to sitting on the side of a flat bed without using bedrails?: None Help needed moving to and from a bed to a chair (including a wheelchair)?: None Help needed standing up from a chair using your arms (e.g., wheelchair or bedside chair)?: None Help needed to walk in hospital room?: None Help needed climbing 3-5 steps with a railing? : None 6 Click Score: 24      End of Session  Activity Tolerance: Patient tolerated treatment well Patient left: in bed;with call bell/phone within reach Nurse Communication: Mobility status PT Visit Diagnosis: Other abnormalities of gait and mobility (R26.89)     Time: 9089-9074 PT Time Calculation (min) (ACUTE ONLY): 15 min  Charges:   PT Evaluation $PT Eval Low Complexity: 1 Low      Edward Weaver P, PT Acute Rehabilitation Services Office: [phone]   Edward Weaver  10/01/2023, 10:12 AM

## 2023-10-01 NOTE — Progress Notes (Signed)
 PROGRESS NOTE    Edward Weaver  FMW:[id number] DOB: Aug 04, 1955 DOA: 09/30/2023 PCP: Sheldon Netter, PA  68/M with morbid obesity, chronic diastolic CHF, permanent A-fib, moderate CAD, hypertension, OSA not on CPAP, history of TIA presented to the ED with progressive dyspnea on exertion, orthopnea and PND, followed by CHF clinic, multiple attempts to diurese with metolazone  and 06 patches prior to admission.  Some noncompliance with medications and significant noncompliance with diet, in the ED noted to be mildly hypoxic, rate controlled A-fib, other vital stable, labs noted BNP proBNP 963, creatinine 1.3, troponin less than 15, hemoglobin 12.9, CTA chest noted no PE, multiple pulmonary nodules   Subjective: -Feels a little better, still short of breath with any activity  Assessment and Plan:  Acute on chronic diastolic CHF - Last echo 5/23 noted preserved EF 50-55%, moderate LVH, mild to moderate mitral regurg - Multiple risk factors, poor compliance with diet, diuretics sometimes and OSA - Continue Lasix  80 Mg twice daily, continue Aldactone  - Add Jardiance, he is a prediabetic as well - Follow-up repeat echo  Persistent atrial fibrillation - Failed attempts at cardioversion in the past - Rate controlled, continue Xarelto   History of CAD - Coronary calcifications noted on CT chest, no symptoms of ACS at this time - Continue statin and Xarelto   OSA Morbid obesity, BMI 46 - Underlying OSA OHS contributing - Patient reports last sleep study being 2 years ago, never followed up subsequently, needs another sleep study after discharge, will send referral - Might also benefit from a GLP-1 agonist  Borderline diabetes - Recent A1c 6.2, add Jardiance  Multiple pulmonary nodules - Needs follow-up    DVT prophylaxis: Xarelto  Code Status: Full code Family Communication: Wife sleeping at bedside Disposition Plan: Home pending improvement in volume status  Consultants:     Procedures:   Antimicrobials:    Objective: Vitals:   10/01/23 0005 10/01/23 0120 10/01/23 0545 10/01/23 0802  BP: 129/76  (!) 129/91 113/86  Pulse: 67 68 68 76  Resp: 18 16 18 16   Temp: 98.7 F (37.1 C)  98.1 F (36.7 C) 97.7 F (36.5 C)  TempSrc: Oral  Oral Oral  SpO2: 95% 92% 98% 98%  Weight:   129.6 kg   Height:        Intake/Output Summary (Last 24 hours) at 10/01/2023 0946 Last data filed at 10/01/2023 9095 Gross per 24 hour  Intake 446.09 ml  Output 1550 ml  Net -1103.91 ml   Filed Weights   09/30/23 0007 10/01/23 0545  Weight: 133.4 kg 129.6 kg    Examination:  General exam: Obese chronically ill male laying in bed HEENT: Neck obese unable to assess JVD Respiratory system: Clear Cardiovascular system: S1 & S2 heard, regular Abd: nondistended, soft and nontender.Normal bowel sounds heard. Central nervous system: Alert and oriented. No focal neurological deficits. Extremities: 2+ edema Skin: No rashes Psychiatry:  Mood & affect appropriate.     Data Reviewed:   CBC: Recent Labs  Lab 09/30/23 0017 09/30/23 2002 10/01/23 0215  WBC 7.3 6.8 6.2  NEUTROABS  --  4.0  --   HGB 12.9* 12.9* 13.0  HCT 38.2* 38.0* 38.6*  MCV 87.6 88.2 87.7  PLT 292 285 292   Basic Metabolic Panel: Recent Labs  Lab 09/29/23 1604 09/30/23 0017 09/30/23 0644 09/30/23 2002 10/01/23 0215  NA 137 137 138 136 136  K 3.5 3.6 3.1* 2.8* 3.1*  CL 96* 95* 95* 93* 92*  CO2 30 30 32 30 31  GLUCOSE 111* 110* 126* 162* 129*  BUN 22 24* 23 24* 24*  CREATININE 1.26* 1.31* 1.29* 1.56* 1.52*  CALCIUM  9.3 9.4 9.2 9.0 8.9  MG 2.1  --   --  2.2 2.1  PHOS  --   --   --  2.6 3.8   GFR: Estimated Creatinine Clearance: 59.3 mL/min (A) (by C-G formula based on SCr of 1.52 mg/dL (H)). Liver Function Tests: Recent Labs  Lab 09/30/23 2002 10/01/23 0215  AST 21 19  ALT 28 28  ALKPHOS 70 66  BILITOT 1.0 1.0  PROT 7.0 7.4  ALBUMIN  3.5 3.5   Recent Labs  Lab 09/30/23 2002   LIPASE 38   No results for input(s): AMMONIA in the last 168 hours. Coagulation Profile: No results for input(s): INR, PROTIME in the last 168 hours. Cardiac Enzymes: Recent Labs  Lab 09/30/23 2002  CKTOTAL 66   BNP (last 3 results) Recent Labs    01/12/23 0933 09/30/23 0017  PROBNP 1,049* 963.0*   HbA1C: Recent Labs    09/30/23 2002  HGBA1C 6.2*   CBG: No results for input(s): GLUCAP in the last 168 hours. Lipid Profile: No results for input(s): CHOL, HDL, LDLCALC, TRIG, CHOLHDL, LDLDIRECT in the last 72 hours. Thyroid  Function Tests: Recent Labs    09/30/23 2002  TSH 1.790   Anemia Panel: Recent Labs    09/30/23 2002  VITAMINB12 188  FOLATE 10.4  FERRITIN 427*  TIBC 364  IRON 63  RETICCTPCT 1.7   Urine analysis:    Component Value Date/Time   COLORURINE YELLOW 09/30/2023 2048   APPEARANCEUR CLEAR 09/30/2023 2048   LABSPEC 1.027 09/30/2023 2048   PHURINE 5.0 09/30/2023 2048   GLUCOSEU NEGATIVE 09/30/2023 2048   HGBUR NEGATIVE 09/30/2023 2048   BILIRUBINUR NEGATIVE 09/30/2023 2048   BILIRUBINUR negative 05/19/2018 1358   KETONESUR NEGATIVE 09/30/2023 2048   PROTEINUR NEGATIVE 09/30/2023 2048   UROBILINOGEN 1.0 05/19/2018 1358   UROBILINOGEN 0.2 12/11/2013 1735   NITRITE NEGATIVE 09/30/2023 2048   LEUKOCYTESUR NEGATIVE 09/30/2023 2048   Sepsis Labs: @LABRCNTIP (procalcitonin:4,lacticidven:4)  )No results found for this or any previous visit (from the past 240 hours).   Radiology Studies: CT Angio Chest PE W and/or Wo Contrast Result Date: 09/30/2023 CLINICAL DATA:  Shortness of breath EXAM: CT ANGIOGRAPHY CHEST WITH CONTRAST TECHNIQUE: Multidetector CT imaging of the chest was performed using the standard protocol during bolus administration of intravenous contrast. Multiplanar CT image reconstructions and MIPs were obtained to evaluate the vascular anatomy. RADIATION DOSE REDUCTION: This exam was performed according to the  departmental dose-optimization program which includes automated exposure control, adjustment of the mA and/or kV according to patient size and/or use of iterative reconstruction technique. CONTRAST:  80mL OMNIPAQUE  IOHEXOL  350 MG/ML SOLN COMPARISON:  Chest x-ray from earlier in the same day, CT from 12/11/2013 FINDINGS: Cardiovascular: Atherosclerotic calcifications of the aorta are noted. No aneurysmal dilatation is seen. No cardiac enlargement is noted. The pulmonary artery shows a normal branching pattern bilaterally. No filling defect to suggest pulmonary embolism is noted. Mediastinum/Nodes: Thoracic inlet is within normal limits. No hilar or mediastinal adenopathy is noted. The esophagus as visualized is within normal limits. Moderate-sized hiatal hernia is again noted. Lungs/Pleura: Lungs are well aerated bilaterally. Scattered parenchymal nodules are noted throughout both lungs. The largest of these lies along the posteromedial aspect of the right lower lobe measuring 5 mm. No focal infiltrate or sizable effusion is seen. Upper Abdomen: No acute abnormality. Musculoskeletal: No chest wall abnormality.  No acute or significant osseous findings. Review of the MIP images confirms the above findings. IMPRESSION: No evidence of pulmonary embolism. Multiple pulmonary nodules. Most significant: Right solid pulmonary nodule measuring 5 mm. Per Fleischner Society Guidelines, no routine follow-up imaging is recommended. These guidelines do not apply to immunocompromised patients and patients with cancer. Follow up in patients with significant comorbidities as clinically warranted. For lung cancer screening, adhere to Lung-RADS guidelines. Reference: Radiology. 2017; 284(1):228-43. No other focal abnormality is noted. Aortic Atherosclerosis (ICD10-I70.0). Electronically Signed   By: Oneil Devonshire M.D.   On: 09/30/2023 02:32   DG Chest 2 View Result Date: 09/30/2023 EXAM: 2 VIEW(S) XRAY OF THE CHEST 09/30/2023 12:22:00  AM COMPARISON: 10/03/2023 CLINICAL HISTORY: SOB. Endorses being at HF clinic today (with routine appointment) and was told he was fluid overloaded and to seek ED assessment if breathing became worse. Intermittent cough. SOB is worse with exertion. FINDINGS: LUNGS AND PLEURA: No focal pulmonary opacity. No pulmonary edema. No pleural effusion. No pneumothorax. HEART AND MEDIASTINUM: Stable cardiomediastinal silhouette. Aortic atherosclerotic calcification. BONES AND SOFT TISSUES: No acute osseous abnormality. IMPRESSION: 1. No acute process. Electronically signed by: Norman Gatlin MD 09/30/2023 12:27 AM EDT RP Workstation: [id number]     Scheduled Meds:  atorvastatin   40 mg Oral QHS   furosemide   80 mg Intravenous Q12H   pantoprazole   40 mg Oral Q1200   rivaroxaban   20 mg Oral Q supper   sodium chloride  flush  3 mL Intravenous Q12H   spironolactone   25 mg Oral Daily   Continuous Infusions:  sodium chloride        LOS: 0 days    Time spent:    Sigurd Pac, MD [facility]   10/01/2023, 9:46 AM

## 2023-10-01 NOTE — TOC CM/SW Note (Addendum)
 Transition of Care [facility]) - Inpatient Brief Assessment   Patient Details  Name: Edward Weaver MRN: [id number] Date of Birth: [birth year]  Transition of Care [facility]) CM/SW Contact:    Edward Barnie Rama, RN Phone Number: 10/01/2023, 11:16 AM   Clinical Narrative: From home with spouse, has PCP and insurance on file, states has no HH services in place at this time or DME at home.  States family member will transport them home at [employer] and family is support system, states gets medications from [facility].  Pta self ambulatory.   NCM offered choice for [facility] for disease management, he states he is not sure he wants this at this time. This NCM will check back with him later today.   Transition of Care Asessment: Insurance and Status: Insurance coverage has been reviewed Patient has primary care physician: Yes Home environment has been reviewed: home with wife Prior level of function:: indep Prior/Current Home Services: No current home services Social Drivers of Health Review: SDOH reviewed no interventions necessary Readmission risk has been reviewed: Yes

## 2023-10-01 NOTE — Telephone Encounter (Signed)
 Pharmacy Patient Advocate Encounter  Insurance verification completed.    The patient is insured through [facility]. Patient has Medicare and is not eligible for a copay card, but may be able to apply for patient assistance or Medicare RX Payment Plan (Patient Must reach out to their plan, if eligible for payment plan), if available.    Ran test claim for Jardiance 10mg  tablets and the current 30 day co-pay is $47.00.   This test claim was processed through [facility]- copay amounts may vary at other pharmacies due to pharmacy/plan contracts, or as the patient moves through the different stages of their insurance plan.

## 2023-10-02 DIAGNOSIS — Z7901 Long term (current) use of anticoagulants: Secondary | ICD-10-CM | POA: Diagnosis not present

## 2023-10-02 DIAGNOSIS — I50811 Acute right heart failure: Secondary | ICD-10-CM | POA: Diagnosis present

## 2023-10-02 DIAGNOSIS — I2510 Atherosclerotic heart disease of native coronary artery without angina pectoris: Secondary | ICD-10-CM | POA: Diagnosis present

## 2023-10-02 DIAGNOSIS — Z8249 Family history of ischemic heart disease and other diseases of the circulatory system: Secondary | ICD-10-CM | POA: Diagnosis not present

## 2023-10-02 DIAGNOSIS — R7303 Prediabetes: Secondary | ICD-10-CM | POA: Diagnosis present

## 2023-10-02 DIAGNOSIS — G629 Polyneuropathy, unspecified: Secondary | ICD-10-CM | POA: Diagnosis not present

## 2023-10-02 DIAGNOSIS — Z79899 Other long term (current) drug therapy: Secondary | ICD-10-CM | POA: Diagnosis not present

## 2023-10-02 DIAGNOSIS — Z820 Family history of epilepsy and other diseases of the nervous system: Secondary | ICD-10-CM | POA: Diagnosis not present

## 2023-10-02 DIAGNOSIS — E876 Hypokalemia: Secondary | ICD-10-CM | POA: Diagnosis not present

## 2023-10-02 DIAGNOSIS — R918 Other nonspecific abnormal finding of lung field: Secondary | ICD-10-CM | POA: Diagnosis present

## 2023-10-02 DIAGNOSIS — E874 Mixed disorder of acid-base balance: Secondary | ICD-10-CM | POA: Diagnosis not present

## 2023-10-02 DIAGNOSIS — I5082 Biventricular heart failure: Secondary | ICD-10-CM | POA: Diagnosis not present

## 2023-10-02 DIAGNOSIS — N1831 Chronic kidney disease, stage 3a: Secondary | ICD-10-CM | POA: Diagnosis not present

## 2023-10-02 DIAGNOSIS — G4733 Obstructive sleep apnea (adult) (pediatric): Secondary | ICD-10-CM | POA: Diagnosis present

## 2023-10-02 DIAGNOSIS — Z91199 Patient's noncompliance with other medical treatment and regimen due to unspecified reason: Secondary | ICD-10-CM | POA: Diagnosis not present

## 2023-10-02 DIAGNOSIS — Z6841 Body Mass Index (BMI) 40.0 and over, adult: Secondary | ICD-10-CM | POA: Diagnosis not present

## 2023-10-02 DIAGNOSIS — Z91119 Patient's noncompliance with dietary regimen due to unspecified reason: Secondary | ICD-10-CM | POA: Diagnosis not present

## 2023-10-02 DIAGNOSIS — Z5986 Financial insecurity: Secondary | ICD-10-CM | POA: Diagnosis not present

## 2023-10-02 DIAGNOSIS — I5033 Acute on chronic diastolic (congestive) heart failure: Secondary | ICD-10-CM | POA: Diagnosis not present

## 2023-10-02 DIAGNOSIS — I4821 Permanent atrial fibrillation: Secondary | ICD-10-CM | POA: Diagnosis not present

## 2023-10-02 DIAGNOSIS — N179 Acute kidney failure, unspecified: Secondary | ICD-10-CM | POA: Diagnosis not present

## 2023-10-02 DIAGNOSIS — I130 Hypertensive heart and chronic kidney disease with heart failure and stage 1 through stage 4 chronic kidney disease, or unspecified chronic kidney disease: Secondary | ICD-10-CM | POA: Diagnosis not present

## 2023-10-02 DIAGNOSIS — Z823 Family history of stroke: Secondary | ICD-10-CM | POA: Diagnosis not present

## 2023-10-02 DIAGNOSIS — E781 Pure hyperglyceridemia: Secondary | ICD-10-CM | POA: Diagnosis not present

## 2023-10-02 LAB — BASIC METABOLIC PANEL WITH GFR
Anion gap: 11 (ref 5–15)
BUN: 22 mg/dL (ref 8–23)
CO2: 32 mmol/L (ref 22–32)
Calcium: 9.3 mg/dL (ref 8.9–10.3)
Chloride: 93 mmol/L — ABNORMAL LOW (ref 98–111)
Creatinine, Ser: 1.44 mg/dL — ABNORMAL HIGH (ref 0.61–1.24)
GFR, Estimated: 53 mL/min — ABNORMAL LOW (ref >60)
Glucose, Bld: 131 mg/dL — ABNORMAL HIGH (ref 70–99)
Potassium: 3.4 mmol/L — ABNORMAL LOW (ref 3.5–5.1)
Sodium: 136 mmol/L (ref 135–145)

## 2023-10-02 MED ORDER — POTASSIUM CHLORIDE CRYS ER 20 MEQ PO TBCR
40.0000 meq | EXTENDED_RELEASE_TABLET | Freq: Two times a day (BID) | ORAL | Status: AC
Start: 2023-10-02 — End: 2023-10-02
  Administered 2023-10-02 (×2): 40 meq via ORAL
  Filled 2023-10-02 (×2): qty 2

## 2023-10-02 NOTE — Progress Notes (Signed)
Placed patient on CPAP for the night via auto-mode.  

## 2023-10-02 NOTE — Plan of Care (Signed)
   Problem: Clinical Measurements: Goal: Ability to maintain clinical measurements within normal limits will improve Outcome: Progressing

## 2023-10-02 NOTE — Progress Notes (Signed)
 PROGRESS NOTE    Edward Weaver  FMW:[id number] DOB: 1955-11-04 DOA: 09/30/2023 PCP: Sheldon Netter, PA  68/M with morbid obesity, chronic diastolic CHF, permanent A-fib, moderate CAD, hypertension, OSA not on CPAP, history of TIA presented to the ED with progressive dyspnea on exertion, orthopnea and PND, followed by CHF clinic, multiple attempts to diurese with metolazone  and 06 patches prior to admission.  Some noncompliance with medications and significant noncompliance with diet, in the ED noted to be mildly hypoxic, rate controlled A-fib, other vital stable, labs noted BNP proBNP 963, creatinine 1.3, troponin less than 15, hemoglobin 12.9, CTA chest noted no PE, multiple pulmonary nodules   Subjective: - Feels better today, breathing is improving  Assessment and Plan:  Acute on chronic diastolic CHF - Last echo 5/23 noted preserved EF 50-55%, moderate LVH, mild to moderate mitral regurg - Multiple risk factors, poor compliance with diet, diuretics sometimes and OSA - Improving with diuresis, 7.7 L negative, continue Lasix  80 Mg twice daily, continue Aldactone , started on Jardiance  yesterday - Repeat echo with preserved EF, normal RV  Persistent atrial fibrillation - Failed attempts at cardioversion in the past - Rate controlled, continue Xarelto   History of CAD - Coronary calcifications noted on CT chest, no symptoms of ACS at this time - Continue statin and Xarelto   OSA Morbid obesity, BMI 46 - Underlying OSA OHS contributing - Patient reports last sleep study being 2 years ago, never followed up subsequently, needs another sleep study after discharge, will send referral - Might also benefit from a GLP-1 agonist  Borderline diabetes - Recent A1c 6.2, add Jardiance   Multiple pulmonary nodules - Needs follow-up    DVT prophylaxis: Xarelto  Code Status: Full code Family Communication: Wife at bedside Disposition Plan: Home pending improvement in volume  status  Consultants:    Procedures:   Antimicrobials:    Objective: Vitals:   10/02/23 0034 10/02/23 0450 10/02/23 0605 10/02/23 0720  BP: (!) 145/95 123/83  106/60  Pulse: 70 67  66  Resp: 14 15  20   Temp: 99.6 F (37.6 C) 98.4 F (36.9 C)  98.3 F (36.8 C)  TempSrc: Oral Oral  Oral  SpO2: 95% 95%  93%  Weight:   128.4 kg   Height:        Intake/Output Summary (Last 24 hours) at 10/02/2023 1025 Last data filed at 10/02/2023 9046 Gross per 24 hour  Intake 723 ml  Output 2850 ml  Net -2127 ml   Filed Weights   09/30/23 0007 10/01/23 0545 10/02/23 0605  Weight: 133.4 kg 129.6 kg 128.4 kg    Examination:  General exam: Obese chronically ill male laying in bed HEENT: Neck obese unable to assess JVD Respiratory system: Clear Cardiovascular system: S1 & S2 heard, regular Abd: nondistended, soft and nontender.Normal bowel sounds heard. Central nervous system: Alert and oriented. No focal neurological deficits. Extremities: 2+ edema Skin: No rashes Psychiatry:  Mood & affect appropriate.     Data Reviewed:   CBC: Recent Labs  Lab 09/30/23 0017 09/30/23 2002 10/01/23 0215  WBC 7.3 6.8 6.2  NEUTROABS  --  4.0  --   HGB 12.9* 12.9* 13.0  HCT 38.2* 38.0* 38.6*  MCV 87.6 88.2 87.7  PLT 292 285 292   Basic Metabolic Panel: Recent Labs  Lab 09/29/23 1604 09/30/23 0017 09/30/23 0644 09/30/23 2002 10/01/23 0215 10/02/23 0239  NA 137 137 138 136 136 136  K 3.5 3.6 3.1* 2.8* 3.1* 3.4*  CL 96* 95* 95* 93*  92* 93*  CO2 30 30 32 30 31 32  GLUCOSE 111* 110* 126* 162* 129* 131*  BUN 22 24* 23 24* 24* 22  CREATININE 1.26* 1.31* 1.29* 1.56* 1.52* 1.44*  CALCIUM  9.3 9.4 9.2 9.0 8.9 9.3  MG 2.1  --   --  2.2 2.1  --   PHOS  --   --   --  2.6 3.8  --    GFR: Estimated Creatinine Clearance: 62.2 mL/min (A) (by C-G formula based on SCr of 1.44 mg/dL (H)). Liver Function Tests: Recent Labs  Lab 09/30/23 2002 10/01/23 0215  AST 21 19  ALT 28 28  ALKPHOS 70  66  BILITOT 1.0 1.0  PROT 7.0 7.4  ALBUMIN  3.5 3.5   Recent Labs  Lab 09/30/23 2002  LIPASE 38   No results for input(s): AMMONIA in the last 168 hours. Coagulation Profile: No results for input(s): INR, PROTIME in the last 168 hours. Cardiac Enzymes: Recent Labs  Lab 09/30/23 2002  CKTOTAL 66   BNP (last 3 results) Recent Labs    01/12/23 0933 09/30/23 0017  PROBNP 1,049* 963.0*   HbA1C: Recent Labs    09/30/23 2002  HGBA1C 6.2*   CBG: No results for input(s): GLUCAP in the last 168 hours. Lipid Profile: No results for input(s): CHOL, HDL, LDLCALC, TRIG, CHOLHDL, LDLDIRECT in the last 72 hours. Thyroid  Function Tests: Recent Labs    09/30/23 2002  TSH 1.790   Anemia Panel: Recent Labs    09/30/23 2002  VITAMINB12 188  FOLATE 10.4  FERRITIN 427*  TIBC 364  IRON 63  RETICCTPCT 1.7   Urine analysis:    Component Value Date/Time   COLORURINE YELLOW 09/30/2023 2048   APPEARANCEUR CLEAR 09/30/2023 2048   LABSPEC 1.027 09/30/2023 2048   PHURINE 5.0 09/30/2023 2048   GLUCOSEU NEGATIVE 09/30/2023 2048   HGBUR NEGATIVE 09/30/2023 2048   BILIRUBINUR NEGATIVE 09/30/2023 2048   BILIRUBINUR negative 05/19/2018 1358   KETONESUR NEGATIVE 09/30/2023 2048   PROTEINUR NEGATIVE 09/30/2023 2048   UROBILINOGEN 1.0 05/19/2018 1358   UROBILINOGEN 0.2 12/11/2013 1735   NITRITE NEGATIVE 09/30/2023 2048   LEUKOCYTESUR NEGATIVE 09/30/2023 2048   Sepsis Labs: @LABRCNTIP (procalcitonin:4,lacticidven:4)  )No results found for this or any previous visit (from the past 240 hours).   Radiology Studies: ECHOCARDIOGRAM COMPLETE Result Date: 10/01/2023    ECHOCARDIOGRAM REPORT   Patient Name:   Edward Weaver Date of Exam: 10/01/2023 Medical Rec #:  [id number]      Height:       66.0 in Accession #:    [id number]     Weight:       285.7 lb Date of Birth:  1955-10-13      BSA:          2.327 m Patient Age:    68 years       BP:           122/81 mmHg Patient  Gender: M              HR:           69 bpm. Exam Location:  Inpatient Procedure: 2D Echo, Cardiac Doppler and Color Doppler (Both Spectral and Color            Flow Doppler were utilized during procedure). Indications:    CHF-Acute Diastolic I50.31  History:        Patient has no prior history of Echocardiogram examinations.  Arrythmias:Atrial Fibrillation; Risk Factors:Current Smoker,                 Hypertension and Sleep Apnea.  Sonographer:    Jayson Gaskins Referring Phys: [id number] SUBRINA SUNDIL  Sonographer Comments: Image acquisition challenging due to patient body habitus. Patient refused Definity. IMPRESSIONS  1. Poor acoustic windows Very limited views Overall LVEF appears normal based on views seen Pt refused Definity. There is mild left ventricular hypertrophy.  2. Right ventricular systolic function is normal. The right ventricular size is normal.  3. The aortic valve is tricuspid. Aortic valve regurgitation is not visualized. FINDINGS  Left Ventricle: Poor acoustic windows Very limited views Overall LVEF appears normal based on views seen Pt refused Definity. The left ventricular internal cavity size was normal in size. There is mild left ventricular hypertrophy. Right Ventricle: The right ventricular size is normal. Right vetricular wall thickness was not assessed. Right ventricular systolic function is normal. Pericardium: There is no evidence of pericardial effusion. Tricuspid Valve: The tricuspid valve is normal in structure. Tricuspid valve regurgitation is trivial. Aortic Valve: The aortic valve is tricuspid. Aortic valve regurgitation is not visualized. Pulmonic Valve: The pulmonic valve was normal in structure. Pulmonic valve regurgitation is not visualized. Aorta: The aortic root and ascending aorta are structurally normal, with no evidence of dilitation. Ascending aorta measurements are within normal limits for age when indexed to body surface area.  LEFT VENTRICLE PLAX 2D  LVIDd:         4.70 cm LVIDs:         2.50 cm LV PW:         1.50 cm LV IVS:        1.20 cm LVOT diam:     2.00 cm LVOT Area:     3.14 cm   AORTA Ao Root diam: 3.40 cm Ao Asc diam:  4.10 cm  SHUNTS Systemic Diam: 2.00 cm Vina Gull MD Electronically signed by Vina Gull MD Signature Date/Time: 10/01/2023/5:45:20 PM    Final      Scheduled Meds:  atorvastatin   40 mg Oral QHS   empagliflozin   10 mg Oral Daily   furosemide   80 mg Intravenous Q12H   pantoprazole   40 mg Oral Q1200   potassium chloride   40 mEq Oral BID   rivaroxaban   20 mg Oral Q supper   sodium chloride  flush  3 mL Intravenous Q12H   spironolactone   25 mg Oral Daily   Continuous Infusions:     LOS: 0 days    Time spent:    Sigurd Pac, MD [facility]   10/02/2023, 10:25 AM

## 2023-10-02 NOTE — Progress Notes (Signed)
  Progress Note  Patient Name: Edward Weaver Date of Encounter: 10/02/2023 [facility] Cardiologist: Madonna Large, DO   Interval Summary   Feels better, in chair.  Pastor at bedside.  Still having some ankle edema he states.  Vital Signs Vitals:   10/02/23 0034 10/02/23 0450 10/02/23 0605 10/02/23 0720  BP: (!) 145/95 123/83  106/60  Pulse: 70 67  66  Resp: 14 15  20   Temp: 99.6 F (37.6 C) 98.4 F (36.9 C)  98.3 F (36.8 C)  TempSrc: Oral Oral  Oral  SpO2: 95% 95%  93%  Weight:   128.4 kg   Height:        Intake/Output Summary (Last 24 hours) at 10/02/2023 0958 Last data filed at 10/02/2023 0953 Gross per 24 hour  Intake 723 ml  Output 2850 ml  Net -2127 ml      10/02/2023    6:05 AM 10/01/2023    5:45 AM 09/30/2023   12:07 AM  Last 3 Weights  Weight (lbs) 283 lb 1.1 oz 285 lb 11.2 oz 294 lb  Weight (kg) 128.4 kg 129.593 kg 133.358 kg      Telemetry/ECG  AFIB rate controlled - Personally Reviewed  Physical Exam  GEN: No acute distress.   Neck: No JVD Cardiac: irreg, no murmurs, rubs, or gallops.  Respiratory: Clear to auscultation bilaterally. GI: Soft, nontender, non-distended  MS: 2+ lower extremity edema  Assessment & Plan   Acute on chronic diastolic heart failure - Secondary to a combination of morbid obesity BMI 46 obstructive sleep apnea chronic metabolic acidosis suggestive of chronic hypoventilation - Continue with IV diuresis 80 mg twice daily Lasix  -Spironolactone  25 mg a day -Creatinine improved to 1.44 from 1.52  Morbid obesity - Continue to encourage weight loss.  We discussed at length.  Hypokalemia - Replating potassium.    For questions or updates, please contact [facility] Please consult [email or web address] for contact info under       Signed, Oneil Parchment, MD

## 2023-10-03 ENCOUNTER — Other Ambulatory Visit: Payer: Self-pay

## 2023-10-03 DIAGNOSIS — I5033 Acute on chronic diastolic (congestive) heart failure: Secondary | ICD-10-CM | POA: Diagnosis not present

## 2023-10-03 LAB — BASIC METABOLIC PANEL WITH GFR
Anion gap: 12 (ref 5–15)
BUN: 27 mg/dL — ABNORMAL HIGH (ref 8–23)
CO2: 31 mmol/L (ref 22–32)
Calcium: 9.3 mg/dL (ref 8.9–10.3)
Chloride: 93 mmol/L — ABNORMAL LOW (ref 98–111)
Creatinine, Ser: 1.65 mg/dL — ABNORMAL HIGH (ref 0.61–1.24)
GFR, Estimated: 45 mL/min — ABNORMAL LOW (ref >60)
Glucose, Bld: 128 mg/dL — ABNORMAL HIGH (ref 70–99)
Potassium: 3.6 mmol/L (ref 3.5–5.1)
Sodium: 136 mmol/L (ref 135–145)

## 2023-10-03 MED ORDER — FUROSEMIDE 40 MG PO TABS
40.0000 mg | ORAL_TABLET | Freq: Two times a day (BID) | ORAL | Status: DC
  Administered 2023-10-03 – 2023-10-04 (×2): 40 mg via ORAL
  Filled 2023-10-03 (×2): qty 1

## 2023-10-03 NOTE — Progress Notes (Signed)
  Progress Note  Patient Name: Edward Weaver Date of Encounter: 10/03/2023 [facility] Cardiologist: Madonna Large, DO   Interval Summary   Sitting in chair.  Breathing comfortably.  Much improved edema.  Vital Signs Vitals:   10/03/23 0008 10/03/23 0500 10/03/23 0507 10/03/23 0718  BP: 124/61  (!) 104/54 (!) 183/83  Pulse: 77  65 79  Resp: 15  15 19   Temp: 98.7 F (37.1 C)  98 F (36.7 C) 98 F (36.7 C)  TempSrc: Axillary  Oral Oral  SpO2: 92%  93% 94%  Weight: 129 kg 129 kg    Height:        Intake/Output Summary (Last 24 hours) at 10/03/2023 0932 Last data filed at 10/03/2023 0809 Gross per 24 hour  Intake 596 ml  Output 1750 ml  Net -1154 ml      10/03/2023    5:00 AM 10/03/2023   12:08 AM 10/02/2023    6:05 AM  Last 3 Weights  Weight (lbs) 284 lb 6.3 oz 284 lb 6.3 oz 283 lb 1.1 oz  Weight (kg) 129 kg 129 kg 128.4 kg      Telemetry/ECG  No adverse arrhythmias- Personally Reviewed  Physical Exam  GEN: No acute distress.   Neck: No JVD Cardiac: RRR, no murmurs, rubs, or gallops.  Respiratory: Clear to auscultation bilaterally. GI: Soft, nontender, non-distended  MS: No edema  Assessment & Plan  68 year old with acute on chronic diastolic heart failure morbid obesity hypokalemia -BMI 46 on admission.  Also has OSA -Spironolactone  25 mg a day -IV Lasix  80 mg twice a day-agree with transition to p.o. now. -Creatinine bounced back to 1.65 from 1.4. -Echo normal EF normal RV -Note as outpatient previous times to diurese with metolazone  and furosix.   Coronary disease - Coronary calcifications noted on CT scan no symptoms of ACS.  Atrial fibrillation permanent - On Xarelto  20 mg a day for anticoagulation - Would not be a good candidate for repeat cardioversion.  Has had attempts in the past but always returns to atrial fibrillation.  Morbid obesity - GLP-1 a referral in place.  Prediabetes - Hemoglobin A1c 6.2.-Jardiance   Possible DC  tomorrow For questions or updates, please contact [facility] Please consult [email or web address] for contact info under       Signed, Oneil Parchment, MD

## 2023-10-03 NOTE — Plan of Care (Signed)

## 2023-10-03 NOTE — Progress Notes (Signed)
 PROGRESS NOTE    Edward Weaver  FMW:[id number] DOB: Jul 16, 1955 DOA: 09/30/2023 PCP: Sheldon Netter, PA  68/M with morbid obesity, chronic diastolic CHF, permanent A-fib, moderate CAD, hypertension, OSA not on CPAP, history of TIA presented to the ED with progressive dyspnea on exertion, orthopnea and PND, followed by CHF clinic, multiple attempts to diurese with metolazone  and 06 patches prior to admission.  Some noncompliance with medications and significant noncompliance with diet, in the ED noted to be mildly hypoxic, rate controlled A-fib, other vital stable, labs noted BNP proBNP 963, creatinine 1.3, troponin less than 15, hemoglobin 12.9, CTA chest noted no PE, multiple pulmonary nodules   Subjective: - Feels better overall, breathing continues to improve Assessment and Plan:  Acute on chronic diastolic CHF - Last echo 5/23 noted preserved EF 50-55%, moderate LVH, mild to moderate mitral regurg -Repeat echo with preserved EF, normal RV - Multiple risk factors, poor compliance with diet, diuretics sometimes and OSA - Improving with diuresis, 9.2 L negative - Could switch to oral diuretics today, continue Aldactone , Jardiance   Persistent atrial fibrillation - Failed attempts at cardioversion in the past - Rate controlled, continue Xarelto   History of CAD - Coronary calcifications noted on CT chest, no symptoms of ACS at this time - Continue statin and Xarelto   OSA Morbid obesity, BMI 46 - Underlying OSA OHS contributing - Patient reports last sleep study being 2 years ago, never followed up subsequently, needs another sleep study after discharge, will send referral - Might also benefit from a GLP-1 agonist  Borderline diabetes - Recent A1c 6.2, add Jardiance   Multiple pulmonary nodules - Needs follow-up    DVT prophylaxis: Xarelto  Code Status: Full code Family Communication: Wife at bedside Disposition Plan: Home pending improvement in volume status  Consultants:     Procedures:   Antimicrobials:    Objective: Vitals:   10/03/23 0008 10/03/23 0500 10/03/23 0507 10/03/23 0718  BP: 124/61  (!) 104/54 (!) 183/83  Pulse: 77  65 79  Resp: 15  15 19   Temp: 98.7 F (37.1 C)  98 F (36.7 C) 98 F (36.7 C)  TempSrc: Axillary  Oral Oral  SpO2: 92%  93% 94%  Weight: 129 kg 129 kg    Height:        Intake/Output Summary (Last 24 hours) at 10/03/2023 1014 Last data filed at 10/03/2023 0809 Gross per 24 hour  Intake 356 ml  Output 1925 ml  Net -1569 ml   Filed Weights   10/02/23 0605 10/03/23 0008 10/03/23 0500  Weight: 128.4 kg 129 kg 129 kg    Examination:  General exam: Obese chronically ill male laying in bed HEENT: Neck obese unable to assess JVD Respiratory system: Clear Cardiovascular system: S1 & S2, irregular Abd: nondistended, soft and nontender.Normal bowel sounds heard. Central nervous system: Alert and oriented. No focal neurological deficits. Extremities: Trace edema Skin: No rashes Psychiatry:  Mood & affect appropriate.     Data Reviewed:   CBC: Recent Labs  Lab 09/30/23 0017 09/30/23 2002 10/01/23 0215  WBC 7.3 6.8 6.2  NEUTROABS  --  4.0  --   HGB 12.9* 12.9* 13.0  HCT 38.2* 38.0* 38.6*  MCV 87.6 88.2 87.7  PLT 292 285 292   Basic Metabolic Panel: Recent Labs  Lab 09/29/23 1604 09/30/23 0017 09/30/23 0644 09/30/23 2002 10/01/23 0215 10/02/23 0239 10/03/23 0327  NA 137   < > 138 136 136 136 136  K 3.5   < > 3.1* 2.8* 3.1*  3.4* 3.6  CL 96*   < > 95* 93* 92* 93* 93*  CO2 30   < > 32 30 31 32 31  GLUCOSE 111*   < > 126* 162* 129* 131* 128*  BUN 22   < > 23 24* 24* 22 27*  CREATININE 1.26*   < > 1.29* 1.56* 1.52* 1.44* 1.65*  CALCIUM  9.3   < > 9.2 9.0 8.9 9.3 9.3  MG 2.1  --   --  2.2 2.1  --   --   PHOS  --   --   --  2.6 3.8  --   --    < > = values in this interval not displayed.   GFR: Estimated Creatinine Clearance: 54.5 mL/min (A) (by C-G formula based on SCr of 1.65 mg/dL (H)). Liver  Function Tests: Recent Labs  Lab 09/30/23 2002 10/01/23 0215  AST 21 19  ALT 28 28  ALKPHOS 70 66  BILITOT 1.0 1.0  PROT 7.0 7.4  ALBUMIN  3.5 3.5   Recent Labs  Lab 09/30/23 2002  LIPASE 38   No results for input(s): AMMONIA in the last 168 hours. Coagulation Profile: No results for input(s): INR, PROTIME in the last 168 hours. Cardiac Enzymes: Recent Labs  Lab 09/30/23 2002  CKTOTAL 66   BNP (last 3 results) Recent Labs    01/12/23 0933 09/30/23 0017  PROBNP 1,049* 963.0*   HbA1C: Recent Labs    09/30/23 2002  HGBA1C 6.2*   CBG: No results for input(s): GLUCAP in the last 168 hours. Lipid Profile: No results for input(s): CHOL, HDL, LDLCALC, TRIG, CHOLHDL, LDLDIRECT in the last 72 hours. Thyroid  Function Tests: Recent Labs    09/30/23 2002  TSH 1.790   Anemia Panel: Recent Labs    09/30/23 2002  VITAMINB12 188  FOLATE 10.4  FERRITIN 427*  TIBC 364  IRON 63  RETICCTPCT 1.7   Urine analysis:    Component Value Date/Time   COLORURINE YELLOW 09/30/2023 2048   APPEARANCEUR CLEAR 09/30/2023 2048   LABSPEC 1.027 09/30/2023 2048   PHURINE 5.0 09/30/2023 2048   GLUCOSEU NEGATIVE 09/30/2023 2048   HGBUR NEGATIVE 09/30/2023 2048   BILIRUBINUR NEGATIVE 09/30/2023 2048   BILIRUBINUR negative 05/19/2018 1358   KETONESUR NEGATIVE 09/30/2023 2048   PROTEINUR NEGATIVE 09/30/2023 2048   UROBILINOGEN 1.0 05/19/2018 1358   UROBILINOGEN 0.2 12/11/2013 1735   NITRITE NEGATIVE 09/30/2023 2048   LEUKOCYTESUR NEGATIVE 09/30/2023 2048   Sepsis Labs: @LABRCNTIP (procalcitonin:4,lacticidven:4)  )No results found for this or any previous visit (from the past 240 hours).   Radiology Studies: ECHOCARDIOGRAM COMPLETE Result Date: 10/01/2023    ECHOCARDIOGRAM REPORT   Patient Name:   Edward Weaver [place]is Date of Exam: 10/01/2023 Medical Rec #:  [id number]      Height:       66.0 in Accession #:    [id number]     Weight:       285.7 lb Date of Birth:   11-28-1955      BSA:          2.327 m Patient Age:    68 years       BP:           122/81 mmHg Patient Gender: M              HR:           69 bpm. Exam Location:  Inpatient Procedure: 2D Echo, Cardiac Doppler and Color Doppler (Both Spectral and Color  Flow Doppler were utilized during procedure). Indications:    CHF-Acute Diastolic I50.31  History:        Patient has no prior history of Echocardiogram examinations.                 Arrythmias:Atrial Fibrillation; Risk Factors:Current Smoker,                 Hypertension and Sleep Apnea.  Sonographer:    Jayson Gaskins Referring Phys: [id number] SUBRINA SUNDIL  Sonographer Comments: Image acquisition challenging due to patient body habitus. Patient refused Definity. IMPRESSIONS  1. Poor acoustic windows Very limited views Overall LVEF appears normal based on views seen Pt refused Definity. There is mild left ventricular hypertrophy.  2. Right ventricular systolic function is normal. The right ventricular size is normal.  3. The aortic valve is tricuspid. Aortic valve regurgitation is not visualized. FINDINGS  Left Ventricle: Poor acoustic windows Very limited views Overall LVEF appears normal based on views seen Pt refused Definity. The left ventricular internal cavity size was normal in size. There is mild left ventricular hypertrophy. Right Ventricle: The right ventricular size is normal. Right vetricular wall thickness was not assessed. Right ventricular systolic function is normal. Pericardium: There is no evidence of pericardial effusion. Tricuspid Valve: The tricuspid valve is normal in structure. Tricuspid valve regurgitation is trivial. Aortic Valve: The aortic valve is tricuspid. Aortic valve regurgitation is not visualized. Pulmonic Valve: The pulmonic valve was normal in structure. Pulmonic valve regurgitation is not visualized. Aorta: The aortic root and ascending aorta are structurally normal, with no evidence of dilitation. Ascending aorta  measurements are within normal limits for age when indexed to body surface area.  LEFT VENTRICLE PLAX 2D LVIDd:         4.70 cm LVIDs:         2.50 cm LV PW:         1.50 cm LV IVS:        1.20 cm LVOT diam:     2.00 cm LVOT Area:     3.14 cm   AORTA Ao Root diam: 3.40 cm Ao Asc diam:  4.10 cm  SHUNTS Systemic Diam: 2.00 cm Vina Gull MD Electronically signed by Vina Gull MD Signature Date/Time: 10/01/2023/5:45:20 PM    Final      Scheduled Meds:  atorvastatin   40 mg Oral QHS   empagliflozin   10 mg Oral Daily   furosemide   80 mg Intravenous Q12H   pantoprazole   40 mg Oral Q1200   rivaroxaban   20 mg Oral Q supper   sodium chloride  flush  3 mL Intravenous Q12H   spironolactone   25 mg Oral Daily   Continuous Infusions:     LOS: 1 day    Time spent:    Sigurd Pac, MD [facility]   10/03/2023, 10:14 AM

## 2023-10-04 ENCOUNTER — Other Ambulatory Visit (HOSPITAL_COMMUNITY): Payer: Self-pay

## 2023-10-04 ENCOUNTER — Ambulatory Visit (HOSPITAL_COMMUNITY)

## 2023-10-04 DIAGNOSIS — N1831 Chronic kidney disease, stage 3a: Secondary | ICD-10-CM

## 2023-10-04 DIAGNOSIS — I4821 Permanent atrial fibrillation: Secondary | ICD-10-CM

## 2023-10-04 LAB — BASIC METABOLIC PANEL WITH GFR
Anion gap: 14 (ref 5–15)
BUN: 30 mg/dL — ABNORMAL HIGH (ref 8–23)
CO2: 28 mmol/L (ref 22–32)
Calcium: 9.3 mg/dL (ref 8.9–10.3)
Chloride: 94 mmol/L — ABNORMAL LOW (ref 98–111)
Creatinine, Ser: 1.64 mg/dL — ABNORMAL HIGH (ref 0.61–1.24)
GFR, Estimated: 45 mL/min — ABNORMAL LOW (ref >60)
Glucose, Bld: 124 mg/dL — ABNORMAL HIGH (ref 70–99)
Potassium: 3.5 mmol/L (ref 3.5–5.1)
Sodium: 136 mmol/L (ref 135–145)

## 2023-10-04 MED ORDER — SPIRONOLACTONE 25 MG PO TABS
25.0000 mg | ORAL_TABLET | Freq: Every day | ORAL | 1 refills | Status: AC
  Filled 2023-10-04: qty 30, 30d supply, fill #0

## 2023-10-04 MED ORDER — EMPAGLIFLOZIN 10 MG PO TABS
10.0000 mg | ORAL_TABLET | Freq: Every day | ORAL | 1 refills | Status: AC
  Filled 2023-10-04: qty 30, 30d supply, fill #0

## 2023-10-04 MED ORDER — GABAPENTIN 300 MG PO CAPS: 300.0000 mg | ORAL_CAPSULE | Freq: Every day | ORAL | Status: AC

## 2023-10-04 MED ORDER — BUMETANIDE 2 MG PO TABS
2.0000 mg | ORAL_TABLET | Freq: Two times a day (BID) | ORAL | Status: DC
  Filled 2023-10-04: qty 1

## 2023-10-04 MED ORDER — SACUBITRIL-VALSARTAN 24-26 MG PO TABS
1.0000 | ORAL_TABLET | Freq: Two times a day (BID) | ORAL | Status: DC
  Administered 2023-10-04: 1 via ORAL
  Filled 2023-10-04: qty 1

## 2023-10-04 NOTE — Plan of Care (Signed)
   Problem: Clinical Measurements: Goal: Ability to maintain clinical measurements within normal limits will improve Outcome: Progressing Goal: Cardiovascular complication will be avoided Outcome: Progressing

## 2023-10-04 NOTE — Progress Notes (Addendum)
 Progress Note  Patient Name: Edward Weaver Date of Encounter: 10/04/2023  Primary Cardiologist: Madonna Large, DO HF Cardiologist: Toribio Fuel, MD   Subjective   Feeling much better today. SOB much improved, no chest pain. Edema improved. Hopeful for DC today  Inpatient Medications    Scheduled Meds:  atorvastatin   40 mg Oral QHS   empagliflozin   10 mg Oral Daily   furosemide   40 mg Oral BID   pantoprazole   40 mg Oral Q1200   rivaroxaban   20 mg Oral Q supper   sodium chloride  flush  3 mL Intravenous Q12H   spironolactone   25 mg Oral Daily   Continuous Infusions:  PRN Meds: acetaminophen  **OR** acetaminophen , albuterol , ondansetron  **OR** ondansetron  (ZOFRAN ) IV, mouth rinse, sodium chloride  flush   Vital Signs    Vitals:   10/03/23 2326 10/04/23 0415 10/04/23 0419 10/04/23 0748  BP: 135/65  (!) 162/95 (!) 159/79  Pulse: 63   89  Resp: 18  15 20   Temp: 98.6 F (37 C)  98.2 F (36.8 C) 98.2 F (36.8 C)  TempSrc: Oral  Oral Oral  SpO2: 94%  93% 93%  Weight:  127.3 kg    Height:        Intake/Output Summary (Last 24 hours) at 10/04/2023 1028 Last data filed at 10/04/2023 0419 Gross per 24 hour  Intake 480 ml  Output 1200 ml  Net -720 ml      10/04/2023    4:15 AM 10/03/2023    5:00 AM 10/03/2023   12:08 AM  Last 3 Weights  Weight (lbs) 280 lb 11.2 oz 284 lb 6.3 oz 284 lb 6.3 oz  Weight (kg) 127.325 kg 129 kg 129 kg     Telemetry    Atrial fib - nocturnal upper 40s, otherwise wide range 60s-120s briefly, mostly controlled in the 80s  - Personally Reviewed  Physical Exam   GEN: No acute distress. Morbidly obese. HEENT: Normocephalic, atraumatic, sclera non-icteric. Neck: No JVD or bruits. Cardiac: RRR no murmurs, rubs, or gallops.  Respiratory: Clear to auscultation bilaterally. Breathing is unlabored. GI: Soft, nontender, non-distended, BS +x 4. MS: no deformity. Extremities: No clubbing or cyanosis. Distal pedal pulses are 2+ and equal  bilaterally. Mild BLE edema in compression hose. Neuro:  AAOx3. Follows commands. Psych:  Responds to questions appropriately with a normal affect.  Labs    High Sensitivity Troponin:   Recent Labs  Lab 09/30/23 2002 09/30/23 2204  TROPONINIHS 8 8      Cardiac EnzymesNo results for input(s): TROPONINI in the last 168 hours. No results for input(s): TROPIPOC in the last 168 hours.   Chemistry Recent Labs  Lab 09/30/23 2002 10/01/23 0215 10/02/23 0239 10/03/23 0327 10/04/23 0356  NA 136 136 136 136 136  K 2.8* 3.1* 3.4* 3.6 3.5  CL 93* 92* 93* 93* 94*  CO2 30 31 32 31 28  GLUCOSE 162* 129* 131* 128* 124*  BUN 24* 24* 22 27* 30*  CREATININE 1.56* 1.52* 1.44* 1.65* 1.64*  CALCIUM  9.0 8.9 9.3 9.3 9.3  PROT 7.0 7.4  --   --   --   ALBUMIN  3.5 3.5  --   --   --   AST 21 19  --   --   --   ALT 28 28  --   --   --   ALKPHOS 70 66  --   --   --   BILITOT 1.0 1.0  --   --   --  GFRNONAA 48* 50* 53* 45* 45*  ANIONGAP 13 13 11 12 14      Hematology Recent Labs  Lab 09/30/23 0017 09/30/23 2002 10/01/23 0215  WBC 7.3 6.8 6.2  RBC 4.36 4.31  4.29 4.40  HGB 12.9* 12.9* 13.0  HCT 38.2* 38.0* 38.6*  MCV 87.6 88.2 87.7  MCH 29.6 29.9 29.5  MCHC 33.8 33.9 33.7  RDW 12.7 12.5 12.5  PLT 292 285 292    BNP Recent Labs  Lab 09/29/23 1604 09/30/23 0017 09/30/23 2002  BNP 143.9*  --  100.4*  PROBNP  --  963.0*  --      DDimer No results for input(s): DDIMER in the last 168 hours.   Radiology    No results found.  Cardiac Studies   2d echo 10/01/23  1. Poor acoustic windows Very limited views Overall LVEF appears normal  based on views seen Pt refused Definity. There is mild left ventricular  hypertrophy.   2. Right ventricular systolic function is normal. The right ventricular  size is normal.   3. The aortic valve is tricuspid. Aortic valve regurgitation is not  visualized.   Patient Profile     68 y.o. 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 Dietary indiscretion noted, prior episodic lapses in med adherence.  Assessment & Plan    1. 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 Morbid obesity with OSA - per initial consult note, Labs with chronic metabolic alkalosis suggest that he has chronic hypoventilation.  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  It is unclear to me whether he truly has undergone a recent enough sleep study that we can use to initiate OP BiPAP therapy.  Treatment for OSA will be critical to prevent worsening right heart failure.  Due to his body habitus he will not derive benefit from an Pluckemin device or from a dental device.  BiPAP will be the best treatment.  It is possible he will need some doses of acetazolamide if he develops excessive metabolic alkalosis. Initial dx of OSA was in 2018. - recently referred for GLP1 but not yet set up through our office. PCP notes indicate they would plan to prescribe as well. Would revisit as OP  3. Coronary artery disease with negative cPET 07/2022 - troponins negative -  not on ASA given  concomitant Eliquis - continue risk factor modification  4. Permanent atrial fibrillation - seen by Dr. Cindie in the past - given chronicity of AF and LA dilation, felt poor candidate for rhythm control, recommended for rate control - on metoprolol  50mg  daily at home, not given here - ?resume at lower dose 25mg  daily  5. Carotid artery disease - revisit  updating study in follow-up, last assessed 07/2022 - continue atorvastatin   6. Dilation of aorta in 2024 - CTA this admission without aneurysmal dilation  7. Prior mild-moderate MR, TR in 2023 - difficult windows this admission   Anticipate ongoing CHF clinic follow-up, already scheduled for 10/13/23   For questions or updates, please contact [facility] Please consult [email or web address] for contact info under Cardiology/STEMI.  Signed, Raphael LOISE Bring, PA-C 10/04/2023, 10:28 AM

## 2023-10-04 NOTE — TOC Transition Note (Signed)
 Transition of Care [facility]) - Discharge Note   Patient Details  Name: Edward Weaver MRN: [id number] Date of Birth: Nov 29, 1955  [facility] CM/SW Contact:  Waddell Barnie Rama, RN Phone Number: 10/04/2023, 1:08 PM   Clinical Narrative:    For dc today, he has transportation, does not want HH. No needs.         Patient Goals and CMS Choice            Discharge Placement                       Discharge Plan and Services Additional resources added to the After Visit Summary for                                       Social Drivers of Health (SDOH) Interventions SDOH Screenings   Food Insecurity: Food Insecurity Present (09/30/2023)  Housing: Low Risk  (09/30/2023)  Transportation Needs: No Transportation Needs (09/30/2023)  Utilities: Not At Risk (09/30/2023)  Depression (PHQ2-9): Low Risk  (05/06/2022)  Financial Resource Strain: Medium Risk (08/02/2023)  Social Connections: Socially Integrated (10/02/2023)  Tobacco Use: Low Risk  (09/30/2023)  Health Literacy: Adequate Health Literacy (08/02/2023)     Readmission Risk Interventions     No data to display

## 2023-10-05 ENCOUNTER — Encounter (HOSPITAL_BASED_OUTPATIENT_CLINIC_OR_DEPARTMENT_OTHER): Payer: Self-pay

## 2023-10-10 NOTE — Progress Notes (Deleted)
 10/11/23- 68yoM for sleep evaluation with concern of OSA NPSG [birth year]/06[birth year]- AHI 47.6/hr, desat to 66%, body weight 267 lbs Hosp recently for CHF, dc 7/17 Epworth score- Body weight today-    CXR 09/30/23 IMPRESSION: 1. No acute process.

## 2023-10-11 ENCOUNTER — Ambulatory Visit: Admitting: Internal Medicine

## 2023-10-12 DIAGNOSIS — M792 Neuralgia and neuritis, unspecified: Secondary | ICD-10-CM | POA: Diagnosis not present

## 2023-10-12 DIAGNOSIS — I5032 Chronic diastolic (congestive) heart failure: Secondary | ICD-10-CM | POA: Diagnosis not present

## 2023-10-13 ENCOUNTER — Encounter (HOSPITAL_COMMUNITY)

## 2023-10-13 NOTE — Progress Notes (Incomplete)
 ADVANCED HF CLINIC NOTE  Primary Care: Sheldon Netter, [place] Primary Cardiologist: Madonna Large, DO HF Cardiologist: Dr. Cherrie   HPI: Edward Weaver is a 68 y.o.. male with moderate coronary artery calcification (214, 66th percentile), chronic HFpEF, OSA not CPAP, Atrial Fibrillation, TIA, HTN, HLD, bilateral carotid artery stenosis, and obesity.   Patient is being followed by the practice for his underlying persistent atrial fibrillation, chronic HFpEF, and carotid disease. Patient noted that lower extremity swelling has been progressive.  He followed up with PCP and his Bumex  was increased from 0.5 mg p.o. daily to 1 mg p.o. daily.  He saw Dr Cindie 07/30/23. He was fluid overloaded. Bumex  was increased to 1 mg bid.  Struggled with volume overload 2/2 medication non-compliance and dietary indiscretion.  Today he returns for HF follow up with his wife. Overall feeling fair. Weight down to 288 lbs after a couple doses of metolazone  but then back up to 296 lbs. He is SOB walking on flat ground, legs and belly swelling and he has new orthopnea. Denies palpitations, abnormal bleeding, CP, dizziness. He has early satiety. Taking all medications. He snores. Continues to drink > 2L/day and eats fast food.    Past Medical History:  Diagnosis Date   Arthritis    Atrial fibrillation (HCC)    Bell's palsy    Carotid artery stenosis, asymptomatic, bilateral    Cerebrovascular disease    Chronic diastolic (congestive) heart failure (HCC)    Hiatal hernia    Hyperlipidemia    Hypertension    Neuropathy    OSA (obstructive sleep apnea)    Prediabetes    TIA (transient ischemic attack)    Nov and/or Dec 2014    Current Outpatient Medications  Medication Sig Dispense Refill   albuterol  (VENTOLIN  HFA) 108 (90 Base) MCG/ACT inhaler Inhale 2 puffs into the lungs every 6 (six) hours as needed for wheezing or shortness of breath. 1 each 0   atorvastatin  (LIPITOR) 40 MG tablet Take 1 tablet  (40 mg total) by mouth at bedtime. 90 tablet 3   bumetanide  (BUMEX ) 2 MG tablet Take 1 tablet (2 mg total) by mouth 2 (two) times daily. (Patient not taking: Reported on 10/01/2023) 60 tablet 11   empagliflozin  (JARDIANCE ) 10 MG TABS tablet Take 1 tablet (10 mg total) by mouth daily. 30 tablet 1   gabapentin  (NEURONTIN ) 300 MG capsule Take 1 capsule (300 mg total) by mouth at bedtime.     metolazone  (ZAROXOLYN ) 2.5 MG tablet Take 1 tablet (2.5 mg total) by mouth 3 (three) times a week. On Monday Wednesday friday 24 tablet 3   rivaroxaban  (XARELTO ) 20 MG TABS tablet Take 1 tablet (20 mg total) by mouth daily with supper. 90 tablet 3   sacubitril -valsartan  (ENTRESTO ) 24-26 MG Take 1 tablet by mouth 2 (two) times daily. 180 tablet 3   spironolactone  (ALDACTONE ) 25 MG tablet Take 1 tablet (25 mg total) by mouth daily. 30 tablet 1   No current facility-administered medications for this visit.   Allergies  Allergen Reactions   Penicillins Other (See Comments)    Syncope, hypotension     Social History   Socioeconomic History   Marital status: Married    Spouse name: Heron   Number of children: 3   Years of education: Not on file   Highest education level: Not on file  Occupational History   Not on file  Tobacco Use   Smoking status: Never   Smokeless tobacco: Never  Vaping  Use   Vaping status: Never Used  Substance and Sexual Activity   Alcohol  use: No   Drug use: No   Sexual activity: Yes    Birth control/protection: None  Other Topics Concern   Not on file  Social History Narrative   Not on file   Social Drivers of Health   Financial Resource Strain: Medium Risk (08/02/2023)   Overall Financial Resource Strain (CARDIA)    Difficulty of Paying Living Expenses: Somewhat hard  Food Insecurity: Food Insecurity Present (09/30/2023)   Hunger Vital Sign    Worried About Running Out of Food in the Last Year: Sometimes true    Ran Out of Food in the Last Year: Sometimes true   Transportation Needs: No Transportation Needs (09/30/2023)   PRAPARE - Administrator, Civil Service (Medical): No    Lack of Transportation (Non-Medical): No  Physical Activity: Not on file  Stress: Not on file  Social Connections: Socially Integrated (10/02/2023)   Social Connection and Isolation Panel    Frequency of Communication with Friends and Family: More than three times a week    Frequency of Social Gatherings with Friends and Family: More than three times a week    Attends Religious Services: More than 4 times per year    Active Member of [employer] or Organizations: Yes    Attends Banker Meetings: Never    Marital Status: Married  Catering manager Violence: Not At Risk (10/01/2023)   Humiliation, Afraid, Rape, and Kick questionnaire    Fear of Current or Ex-Partner: No    Emotionally Abused: No    Physically Abused: No    Sexually Abused: No    Family History  Problem Relation Age of Onset   Hypertension Mother        Living   Cancer - Other Mother        Oral   Heart disease Mother    Hypertension Father    Alzheimer's disease Father        Living   Stroke Brother 43       Deceased   Healthy Brother        x1   Alzheimer's disease Paternal Aunt    Stroke Paternal Aunt        #1   Dementia Paternal Aunt        #2   Atrial fibrillation Son    Stroke Son    Hypertension Son    Wt Readings from Last 3 Encounters:  10/04/23 127.3 kg (280 lb 11.2 oz)  09/29/23 134.3 kg (296 lb)  09/14/23 134.3 kg (296 lb)   There were no vitals taken for this visit.  PHYSICAL EXAM: General:  NAD. No resp difficulty HEENT: Normal Neck: Supple. Thick neck, JVP to jaw Cor: Irregular rate & rhythm. No rubs, gallops or murmurs. Lungs: Clear, faint crackles in bases Abdomen: Obese, nontender, +distended.  Extremities: No cyanosis, clubbing, rash, 2+ BLE to knees edema Neuro: Alert & oriented x 3, moves all 4 extremities w/o difficulty. Affect  pleasant.  ReDs reading: 49%, abnormal  ASSESSMENT & PLAN: 1. Acute on Chronic HFpEF - Echo 2023: EF 50-55% Grade I DD.  - Cardiac PET 5/24: LV EF 47%, stress EF 42%. Moderate coronary calcifications were present. Coronary calcifications were present in the LAD, LCx and RCA. Findings are consistent with no ischemia and no infarction. The study is low risk.  - NYHA III-IIIb. Volume overloaded, ReDs 49%.  - Use Furoscix  + metolazone   2.5 mg + 60 KCL daily x 3 days - After 3 days, resume higher dose of Bumex  at 2 mg bid + 20 KCL daily. (May ultimately respond better to torsemide) - Continue Entresto  24/26 mg bid - Consider SGLT2i down the road. - Echo arranged for next week - Labs today. Repeat BMET in 1 week  2.  Persistent A Fib  - Followed by EP. Needs optimization to be a candidate for cardioversion.  - Rate controlled.  - Continue Toprol  XL 50 mg daily.  - Continue Xarelto  20 daily. Denies abnormal bleeding.   3. Obesity  - There is no height or weight on file to calculate BMI. - Has been referred to pharmacy for GLP1   4. OSA - Needs CPAP  5. [place] - Wife on board to help with compliance.  Follow up in 1 week with APP. He is high risk for admission.  Harlene CHRISTELLA Gainer, FNP  3:56 PM

## 2023-10-14 DIAGNOSIS — I2584 Coronary atherosclerosis due to calcified coronary lesion: Secondary | ICD-10-CM | POA: Diagnosis not present

## 2023-10-14 DIAGNOSIS — I10 Essential (primary) hypertension: Secondary | ICD-10-CM | POA: Diagnosis not present

## 2023-10-14 DIAGNOSIS — I2510 Atherosclerotic heart disease of native coronary artery without angina pectoris: Secondary | ICD-10-CM | POA: Diagnosis not present

## 2023-10-14 DIAGNOSIS — E782 Mixed hyperlipidemia: Secondary | ICD-10-CM | POA: Diagnosis not present

## 2023-10-15 ENCOUNTER — Telehealth (HOSPITAL_COMMUNITY): Payer: Self-pay

## 2023-10-15 NOTE — Telephone Encounter (Signed)
 Called to confirm/remind patient of their appointment at the [facility] on 10/18/23 2:30.   Appointment:   [] Confirmed  [] Left mess   [] No answer/No voice mail  [x] VM Full/unable to leave message  [] Phone not in service  Patient reminded to bring all medications and/or complete list.  Confirmed patient has transportation. Gave directions, instructed to utilize valet parking.

## 2023-10-15 NOTE — Telephone Encounter (Signed)
 Called to confirm/remind patient of their appointment at the [facility] on 10/15/23.   Appointment:   [x] Confirmed  [] Left mess   [] No answer/No voice mail  [] VM Full/unable to leave message  [] Phone not in service  Patient reminded to bring all medications and/or complete list.  Confirmed patient has transportation. Gave directions, instructed to utilize valet parking.

## 2023-10-18 ENCOUNTER — Inpatient Hospital Stay (HOSPITAL_COMMUNITY)
Admission: RE | Admit: 2023-10-18 | Discharge: 2023-10-18 | Disposition: A | Discharge status: Home / Self Care | Source: Ambulatory Visit

## 2023-10-22 ENCOUNTER — Telehealth (HOSPITAL_COMMUNITY): Payer: Self-pay

## 2023-10-22 NOTE — Telephone Encounter (Signed)
 Called to confirm/remind patient of their appointment at the [facility] on 10/25/23 2:30.   Patient has another appointment Monday at 2:00 and will need to reschedule.

## 2023-10-25 ENCOUNTER — Encounter (HOSPITAL_COMMUNITY)

## 2023-11-14 DIAGNOSIS — E782 Mixed hyperlipidemia: Secondary | ICD-10-CM | POA: Diagnosis not present

## 2023-11-14 DIAGNOSIS — I2510 Atherosclerotic heart disease of native coronary artery without angina pectoris: Secondary | ICD-10-CM | POA: Diagnosis not present

## 2023-11-14 DIAGNOSIS — I2584 Coronary atherosclerosis due to calcified coronary lesion: Secondary | ICD-10-CM | POA: Diagnosis not present

## 2023-11-14 DIAGNOSIS — I10 Essential (primary) hypertension: Secondary | ICD-10-CM | POA: Diagnosis not present

## 2023-11-24 NOTE — Discharge Summary (Signed)
 Physician Discharge Summary  Edward Weaver FMW:[id number] DOB: 1955-08-11 DOA: 09/30/2023  PCP: Sheldon Netter, PA  Admit date: 09/30/2023 Discharge date: 10/04/2023  Time spent: 45 minutes  Recommendations for Outpatient Follow-up:  PCP in 1 to 2 weeks, please check BMP at follow-up Pulmonary nodules noted on imaging, needs follow-up [facility] in 2 to 3 weeks   Discharge Diagnoses:  Principal Problem:   Acute exacerbation of CHF (congestive heart failure) (HCC) Active Problems:   A-fib (HCC)   Neuropathy   Dyslipidemia   OSA (obstructive sleep apnea)   Essential hypertension   BMI 45.0-49.9, adult (HCC)   Hypokalemia   Anemia   Pulmonary nodules   Discharge Condition: Improved  Diet recommendation: Healthy  Filed Weights   10/03/23 0008 10/03/23 0500 10/04/23 0415  Weight: 129 kg 129 kg 127.3 kg    History of present illness:  68/M with morbid obesity, chronic diastolic CHF, permanent A-fib, moderate CAD, hypertension, OSA not on CPAP, history of TIA presented to the ED with progressive dyspnea on exertion, orthopnea and PND, followed by CHF clinic, multiple attempts to diurese with metolazone  and 06 patches prior to admission. Some noncompliance with medications and significant noncompliance with diet, in the ED noted to be mildly hypoxic, rate controlled A-fib, other vital stable, labs noted BNP proBNP 963, creatinine 1.3, troponin less than 15, hemoglobin 12.9, CTA chest noted no PE, multiple pulmonary nodules   Hospital Course:  Acute on chronic diastolic CHF - Last echo 5/23 noted preserved EF 50-55%, moderate LVH, mild to moderate mitral regurg -Repeat echo with preserved EF, normal RV - Multiple risk factors, poor compliance with diet, diuretics sometimes and OSA - Improving with diuresis, 9.2 L negative - switch to oral diuretics today, continue Aldactone , Jardiance  - Follow-up with [facility]   Persistent atrial fibrillation - Failed attempts at  cardioversion in the past - Rate controlled, continue Xarelto    History of CAD - Coronary calcifications noted on CT chest, no symptoms of ACS at this time - Continue statin and Xarelto    OSA Morbid obesity, BMI 46 - Underlying OSA OHS contributing - Patient reports last sleep study being 2 years ago, never followed up subsequently, needs another sleep study after discharge, will send referral - Might also benefit from a GLP-1 agonist   Borderline diabetes - Recent A1c 6.2, add Jardiance    Multiple pulmonary nodules - Needs follow-up Discharge Exam: Vitals:   10/04/23 0748 10/04/23 1133  BP: (!) 159/79 (!) 156/81  Pulse: 89 71  Resp: 20 19  Temp: 98.2 F (36.8 C) 98 F (36.7 C)  SpO2: 93% 95%   General exam: Obese chronically ill male laying in bed HEENT: Neck obese unable to assess JVD Respiratory system: Clear Cardiovascular system: S1 & S2, irregular Abd: nondistended, soft and nontender.Normal bowel sounds heard. Central nervous system: Alert and oriented. No focal neurological deficits. Extremities: Trace edema Skin: No rashes Psychiatry:  Mood & affect appropriate.        Discharge Instructions   Discharge Instructions     Diet - low sodium heart healthy   Complete by: As directed    Increase activity slowly   Complete by: As directed    Pulmonary Visit   Complete by: As directed    Reason for referral: Sleep/Apnea      Allergies as of 10/04/2023       Reactions   Penicillins Other (See Comments)   Syncope, hypotension        Medication  List     STOP taking these medications    metoprolol  succinate 50 MG 24 hr tablet Commonly known as: TOPROL -XL   potassium chloride  SA 20 MEQ tablet Commonly known as: KLOR-CON  M       TAKE these medications    albuterol  108 (90 Base) MCG/ACT inhaler Commonly known as: Ventolin  HFA Inhale 2 puffs into the lungs every 6 (six) hours as needed for wheezing or shortness of breath.   atorvastatin  40  MG tablet Commonly known as: LIPITOR Take 1 tablet (40 mg total) by mouth at bedtime.   bumetanide  2 MG tablet Commonly known as: BUMEX  Take 1 tablet (2 mg total) by mouth 2 (two) times daily. What changed: Another medication with the same name was removed. Continue taking this medication, and follow the directions you see here.   gabapentin  300 MG capsule Commonly known as: NEURONTIN  Take 1 capsule (300 mg total) by mouth at bedtime. What changed: how much to take   Jardiance  10 MG Tabs tablet Generic drug: empagliflozin  Take 1 tablet (10 mg total) by mouth daily.   metolazone  2.5 MG tablet Commonly known as: ZAROXOLYN  Take 1 tablet (2.5 mg total) by mouth 3 (three) times a week. On Monday Wednesday friday   sacubitril -valsartan  24-26 MG Commonly known as: ENTRESTO  Take 1 tablet by mouth 2 (two) times daily.   spironolactone  25 MG tablet Commonly known as: ALDACTONE  Take 1 tablet (25 mg total) by mouth daily.   Xarelto  20 MG Tabs tablet Generic drug: rivaroxaban  Take 1 tablet (20 mg total) by mouth daily with supper.       Allergies  Allergen Reactions   Penicillins Other (See Comments)    Syncope, hypotension    Follow-up Information     Sheldon Netter, PA Follow up.   Specialty: Physician Assistant Contact information: [address] [place] [address] [place] [address] [phone]                  The results of significant diagnostics from this hospitalization (including imaging, microbiology, ancillary and laboratory) are listed below for reference.    Significant Diagnostic Studies: No results found.  Microbiology: No results found for this or any previous visit (from the past 240 hours).   Labs: Basic Metabolic Panel: No results for input(s): NA, K, CL, CO2, GLUCOSE, BUN, CREATININE, CALCIUM , MG, PHOS in the last 168 hours. Liver Function Tests: No results for input(s): AST, ALT, ALKPHOS, BILITOT, PROT,  ALBUMIN  in the last 168 hours. No results for input(s): LIPASE, AMYLASE in the last 168 hours. No results for input(s): AMMONIA in the last 168 hours. CBC: No results for input(s): WBC, NEUTROABS, HGB, HCT, MCV, PLT in the last 168 hours. Cardiac Enzymes: No results for input(s): CKTOTAL, CKMB, CKMBINDEX, TROPONINI in the last 168 hours. BNP: BNP (last 3 results) Recent Labs    09/14/23 1547 09/29/23 1604 09/30/23 2002  BNP 198.4* 143.9* 100.4*    ProBNP (last 3 results) Recent Labs    01/12/23 0933 09/30/23 0017  PROBNP 1,049* 963.0*    CBG: No results for input(s): GLUCAP in the last 168 hours.     Signed:  Sigurd Pac MD.  [facility] 11/24/2023, 3:15 PM

## 2023-11-30 ENCOUNTER — Ambulatory Visit: Admitting: Nurse Practitioner

## 2023-11-30 NOTE — Progress Notes (Incomplete)
 ADVANCED HF CLINIC NOTE  Primary Care: [facility], [place] Primary Cardiologist: Madonna Large, DO HF Cardiologist: Dr. Cherrie   HPI: Edward Weaver is a 68 y.o.. male with moderate coronary artery calcification (214, 66th percentile), chronic HFpEF, OSA not CPAP, Atrial Fibrillation, TIA, HTN, HLD, bilateral carotid artery stenosis, and obesity.   Patient is being followed by the practice for his underlying persistent atrial fibrillation, chronic HFpEF, and carotid disease. Patient noted that lower extremity swelling has been progressive.  He followed up with PCP and his Bumex  was increased from 0.5 mg p.o. daily to 1 mg p.o. daily.  He saw Dr Cindie 07/30/23. He was fluid overloaded. Bumex  was increased to 1 mg bid.  Struggled with volume overload 2/2 medication non-compliance and dietary indiscretion.  Today he returns for HF follow up with his wife. Overall feeling fair. Weight down to 288 lbs after a couple doses of metolazone  but then back up to 296 lbs. He is SOB walking on flat ground, legs and belly swelling and he has new orthopnea. Denies palpitations, abnormal bleeding, CP, dizziness. He has early satiety. Taking all medications. He snores. Continues to drink > 2L/day and eats fast food.    Past Medical History:  Diagnosis Date   Arthritis    Atrial fibrillation (HCC)    Bell's palsy    Carotid artery stenosis, asymptomatic, bilateral    Cerebrovascular disease    Chronic diastolic (congestive) heart failure (HCC)    Hiatal hernia    Hyperlipidemia    Hypertension    Neuropathy    OSA (obstructive sleep apnea)    Prediabetes    TIA (transient ischemic attack)    Nov and/or Dec 2014    Current Outpatient Medications  Medication Sig Dispense Refill   albuterol  (VENTOLIN  HFA) 108 (90 Base) MCG/ACT inhaler Inhale 2 puffs into the lungs every 6 (six) hours as needed for wheezing or shortness of breath. 1 each 0   atorvastatin  (LIPITOR) 40 MG tablet Take 1 tablet  (40 mg total) by mouth at bedtime. 90 tablet 3   bumetanide  (BUMEX ) 2 MG tablet Take 1 tablet (2 mg total) by mouth 2 (two) times daily. (Patient not taking: Reported on 10/01/2023) 60 tablet 11   empagliflozin  (JARDIANCE ) 10 MG TABS tablet Take 1 tablet (10 mg total) by mouth daily. 30 tablet 1   gabapentin  (NEURONTIN ) 300 MG capsule Take 1 capsule (300 mg total) by mouth at bedtime.     metolazone  (ZAROXOLYN ) 2.5 MG tablet Take 1 tablet (2.5 mg total) by mouth 3 (three) times a week. On Monday Wednesday friday 24 tablet 3   rivaroxaban  (XARELTO ) 20 MG TABS tablet Take 1 tablet (20 mg total) by mouth daily with supper. 90 tablet 3   sacubitril -valsartan  (ENTRESTO ) 24-26 MG Take 1 tablet by mouth 2 (two) times daily. 180 tablet 3   spironolactone  (ALDACTONE ) 25 MG tablet Take 1 tablet (25 mg total) by mouth daily. 30 tablet 1   No current facility-administered medications for this visit.   Allergies  Allergen Reactions   Penicillins Other (See Comments)    Syncope, hypotension     Social History   Socioeconomic History   Marital status: Married    Spouse name: Heron   Number of children: 3   Years of education: Not on file   Highest education level: Not on file  Occupational History   Not on file  Tobacco Use   Smoking status: Never   Smokeless tobacco: Never  Vaping  Use   Vaping status: Never Used  Substance and Sexual Activity   Alcohol  use: No   Drug use: No   Sexual activity: Yes    Birth control/protection: None  Other Topics Concern   Not on file  Social History Narrative   Not on file   Social Drivers of Health   Financial Resource Strain: Medium Risk (08/02/2023)   Overall Financial Resource Strain (CARDIA)    Difficulty of Paying Living Expenses: Somewhat hard  Food Insecurity: Food Insecurity Present (09/30/2023)   Hunger Vital Sign    Worried About Running Out of Food in the Last Year: Sometimes true    Ran Out of Food in the Last Year: Sometimes true   Transportation Needs: No Transportation Needs (09/30/2023)   PRAPARE - Administrator, Civil Service (Medical): No    Lack of Transportation (Non-Medical): No  Physical Activity: Not on file  Stress: Not on file  Social Connections: Socially Integrated (10/02/2023)   Social Connection and Isolation Panel    Frequency of Communication with Friends and Family: More than three times a week    Frequency of Social Gatherings with Friends and Family: More than three times a week    Attends Religious Services: More than 4 times per year    Active Member of [employer] or Organizations: Yes    Attends Banker Meetings: Never    Marital Status: Married  Catering manager Violence: Not At Risk (10/01/2023)   Humiliation, Afraid, Rape, and Kick questionnaire    Fear of Current or Ex-Partner: No    Emotionally Abused: No    Physically Abused: No    Sexually Abused: No    Family History  Problem Relation Age of Onset   Hypertension Mother        Living   Cancer - Other Mother        Oral   Heart disease Mother    Hypertension Father    Alzheimer's disease Father        Living   Stroke Brother 11       Deceased   Healthy Brother        x1   Alzheimer's disease Paternal Aunt    Stroke Paternal Aunt        #1   Dementia Paternal Aunt        #2   Atrial fibrillation Son    Stroke Son    Hypertension Son    Wt Readings from Last 3 Encounters:  10/04/23 127.3 kg (280 lb 11.2 oz)  09/29/23 134.3 kg (296 lb)  09/14/23 134.3 kg (296 lb)   There were no vitals taken for this visit.  PHYSICAL EXAM: General:  NAD. No resp difficulty HEENT: Normal Neck: Supple. Thick neck, JVP to jaw Cor: Irregular rate & rhythm. No rubs, gallops or murmurs. Lungs: Clear, faint crackles in bases Abdomen: Obese, nontender, +distended.  Extremities: No cyanosis, clubbing, rash, 2+ BLE to knees edema Neuro: Alert & oriented x 3, moves all 4 extremities w/o difficulty. Affect  pleasant.  ReDs reading: 49%, abnormal  ASSESSMENT & PLAN: 1. Acute on Chronic HFpEF - Echo 2023: EF 50-55% Grade I DD.  - Cardiac PET 5/24: LV EF 47%, stress EF 42%. Moderate coronary calcifications were present. Coronary calcifications were present in the LAD, LCx and RCA. Findings are consistent with no ischemia and no infarction. The study is low risk.  - NYHA III-IIIb. Volume overloaded, ReDs 49%.  - Use Furoscix  + metolazone   2.5 mg + 60 KCL daily x 3 days - After 3 days, resume higher dose of Bumex  at 2 mg bid + 20 KCL daily. (May ultimately respond better to torsemide) - Continue Entresto  24/26 mg bid - Consider SGLT2i down the road. - Echo arranged for next week - Labs today. Repeat BMET in 1 week  2.  Persistent A Fib  - Followed by EP. Needs optimization to be a candidate for cardioversion.  - Rate controlled.  - Continue Toprol  XL 50 mg daily.  - Continue Xarelto  20 daily. Denies abnormal bleeding.   3. Obesity  - There is no height or weight on file to calculate BMI. - Has been referred to pharmacy for GLP1   4. OSA - Needs CPAP  5. [place] - Wife on board to help with compliance.  Follow up in 1 week with APP. He is high risk for admission.  Harlene CHRISTELLA Gainer, FNP  4:14 PM

## 2023-12-03 ENCOUNTER — Encounter (HOSPITAL_COMMUNITY)

## 2023-12-13 ENCOUNTER — Ambulatory Visit: Admitting: Primary Care

## 2023-12-13 DIAGNOSIS — I5032 Chronic diastolic (congestive) heart failure: Secondary | ICD-10-CM | POA: Diagnosis not present

## 2023-12-13 DIAGNOSIS — M25561 Pain in right knee: Secondary | ICD-10-CM | POA: Diagnosis not present

## 2023-12-13 DIAGNOSIS — R7303 Prediabetes: Secondary | ICD-10-CM | POA: Diagnosis not present

## 2023-12-13 DIAGNOSIS — I10 Essential (primary) hypertension: Secondary | ICD-10-CM | POA: Diagnosis not present

## 2023-12-13 DIAGNOSIS — I2584 Coronary atherosclerosis due to calcified coronary lesion: Secondary | ICD-10-CM | POA: Diagnosis not present

## 2024-01-17 ENCOUNTER — Ambulatory Visit: Admitting: Primary Care
